# Patient Record
Sex: Male | Born: 1965
Health system: Southern US, Community
[De-identification: ages and names within clinical notes are randomized; demographics above are authoritative.]

## PROBLEM LIST (undated history)

## (undated) DIAGNOSIS — E119 Type 2 diabetes mellitus without complications: Secondary | ICD-10-CM

## (undated) DIAGNOSIS — G4730 Sleep apnea, unspecified: Secondary | ICD-10-CM

## (undated) DIAGNOSIS — D631 Anemia in chronic kidney disease: Secondary | ICD-10-CM

## (undated) DIAGNOSIS — J45909 Unspecified asthma, uncomplicated: Secondary | ICD-10-CM

## (undated) DIAGNOSIS — G4700 Insomnia, unspecified: Secondary | ICD-10-CM

## (undated) DIAGNOSIS — K219 Gastro-esophageal reflux disease without esophagitis: Secondary | ICD-10-CM

## (undated) DIAGNOSIS — N259 Disorder resulting from impaired renal tubular function, unspecified: Secondary | ICD-10-CM

## (undated) DIAGNOSIS — I10 Essential (primary) hypertension: Secondary | ICD-10-CM

## (undated) DIAGNOSIS — I509 Heart failure, unspecified: Secondary | ICD-10-CM

## (undated) DIAGNOSIS — F329 Major depressive disorder, single episode, unspecified: Secondary | ICD-10-CM

## (undated) DIAGNOSIS — I428 Other cardiomyopathies: Secondary | ICD-10-CM

## (undated) DIAGNOSIS — F411 Generalized anxiety disorder: Secondary | ICD-10-CM

## (undated) DIAGNOSIS — N183 Chronic kidney disease, stage 3 unspecified: Secondary | ICD-10-CM

## (undated) DIAGNOSIS — J309 Allergic rhinitis, unspecified: Secondary | ICD-10-CM

## (undated) DIAGNOSIS — M779 Enthesopathy, unspecified: Secondary | ICD-10-CM

## (undated) DIAGNOSIS — F3289 Other specified depressive episodes: Secondary | ICD-10-CM

## (undated) DIAGNOSIS — I429 Cardiomyopathy, unspecified: Secondary | ICD-10-CM

## (undated) HISTORY — DX: Sleep apnea, unspecified: G47.30

## (undated) HISTORY — DX: Essential (primary) hypertension: I10

## (undated) HISTORY — DX: Other cardiomyopathies: I42.8

## (undated) HISTORY — PX: PATELLAR TENDON REPAIR: SHX737

## (undated) HISTORY — DX: Insomnia, unspecified: G47.00

## (undated) HISTORY — DX: Cardiomyopathy, unspecified: I42.9

## (undated) HISTORY — DX: Generalized anxiety disorder: F41.1

## (undated) HISTORY — PX: APPENDECTOMY: SHX54

## (undated) HISTORY — DX: Allergic rhinitis, unspecified: J30.9

## (undated) HISTORY — DX: Disorder resulting from impaired renal tubular function, unspecified: N25.9

## (undated) HISTORY — DX: Other specified depressive episodes: F32.89

## (undated) HISTORY — DX: Major depressive disorder, single episode, unspecified: F32.9

## (undated) HISTORY — DX: Morbid (severe) obesity due to excess calories: E66.01

---

## 2002-05-22 ENCOUNTER — Ambulatory Visit (HOSPITAL_COMMUNITY): Admission: RE | Admit: 2002-05-22 | Discharge: 2002-05-22 | Payer: Self-pay | Admitting: Internal Medicine

## 2003-01-23 ENCOUNTER — Ambulatory Visit (HOSPITAL_COMMUNITY): Admission: RE | Admit: 2003-01-23 | Discharge: 2003-01-23 | Payer: Self-pay | Admitting: Internal Medicine

## 2003-01-29 ENCOUNTER — Ambulatory Visit (HOSPITAL_COMMUNITY): Admission: RE | Admit: 2003-01-29 | Discharge: 2003-01-29 | Payer: Self-pay | Admitting: Internal Medicine

## 2003-01-29 ENCOUNTER — Encounter: Payer: Self-pay | Admitting: Internal Medicine

## 2004-04-23 ENCOUNTER — Encounter: Admission: RE | Admit: 2004-04-23 | Discharge: 2004-04-23 | Payer: Self-pay | Admitting: General Surgery

## 2005-07-23 ENCOUNTER — Inpatient Hospital Stay (HOSPITAL_COMMUNITY): Admission: EM | Admit: 2005-07-23 | Discharge: 2005-07-26 | Payer: Self-pay | Admitting: *Deleted

## 2005-07-25 ENCOUNTER — Encounter (INDEPENDENT_AMBULATORY_CARE_PROVIDER_SITE_OTHER): Payer: Self-pay | Admitting: Interventional Cardiology

## 2005-07-25 ENCOUNTER — Encounter: Payer: Self-pay | Admitting: Vascular Surgery

## 2005-07-29 ENCOUNTER — Ambulatory Visit: Payer: Self-pay | Admitting: Nurse Practitioner

## 2005-07-29 ENCOUNTER — Ambulatory Visit: Payer: Self-pay | Admitting: *Deleted

## 2005-08-26 ENCOUNTER — Ambulatory Visit: Payer: Self-pay | Admitting: Nurse Practitioner

## 2005-12-12 ENCOUNTER — Ambulatory Visit: Payer: Self-pay | Admitting: Nurse Practitioner

## 2005-12-16 ENCOUNTER — Ambulatory Visit (HOSPITAL_COMMUNITY): Admission: RE | Admit: 2005-12-16 | Discharge: 2005-12-16 | Payer: Self-pay | Admitting: Internal Medicine

## 2006-01-02 ENCOUNTER — Ambulatory Visit: Payer: Self-pay | Admitting: Nurse Practitioner

## 2006-01-31 ENCOUNTER — Ambulatory Visit: Payer: Self-pay | Admitting: Nurse Practitioner

## 2006-02-27 ENCOUNTER — Ambulatory Visit: Payer: Self-pay | Admitting: Nurse Practitioner

## 2006-03-28 ENCOUNTER — Ambulatory Visit: Payer: Self-pay | Admitting: Nurse Practitioner

## 2006-05-17 ENCOUNTER — Ambulatory Visit: Payer: Self-pay | Admitting: Nurse Practitioner

## 2006-05-31 ENCOUNTER — Ambulatory Visit: Payer: Self-pay | Admitting: Nurse Practitioner

## 2006-06-08 ENCOUNTER — Ambulatory Visit: Payer: Self-pay | Admitting: Internal Medicine

## 2006-09-04 ENCOUNTER — Ambulatory Visit: Payer: Self-pay | Admitting: Internal Medicine

## 2006-09-18 ENCOUNTER — Ambulatory Visit: Payer: Self-pay | Admitting: Nurse Practitioner

## 2006-11-21 ENCOUNTER — Ambulatory Visit: Payer: Self-pay | Admitting: Internal Medicine

## 2006-11-28 ENCOUNTER — Ambulatory Visit (HOSPITAL_COMMUNITY): Admission: RE | Admit: 2006-11-28 | Discharge: 2006-11-28 | Payer: Self-pay | Admitting: Nurse Practitioner

## 2006-12-07 ENCOUNTER — Ambulatory Visit: Payer: Self-pay | Admitting: Family Medicine

## 2006-12-08 ENCOUNTER — Encounter (INDEPENDENT_AMBULATORY_CARE_PROVIDER_SITE_OTHER): Payer: Self-pay | Admitting: Nurse Practitioner

## 2006-12-08 LAB — CONVERTED CEMR LAB
Albumin: 4 g/dL (ref 3.5–5.2)
BUN: 16 mg/dL (ref 6–23)
Basophils Absolute: 0.1 10*3/uL (ref 0.0–0.1)
Basophils Relative: 1 % (ref 0–1)
CO2: 26 meq/L (ref 19–32)
Calcium, Total (PTH): 9.6 mg/dL (ref 8.4–10.5)
Calcium: 9.6 mg/dL (ref 8.4–10.5)
Chloride: 105 meq/L (ref 96–112)
Creatinine, Ser: 2.08 mg/dL — ABNORMAL HIGH (ref 0.40–1.50)
Eosinophils Absolute: 0.4 10*3/uL (ref 0.0–0.7)
Eosinophils Relative: 4 % (ref 0–5)
Glucose, Bld: 79 mg/dL (ref 70–99)
HCT: 40 % (ref 39.0–52.0)
Hemoglobin: 12.5 g/dL — ABNORMAL LOW (ref 13.0–17.0)
Lymphocytes Relative: 23 % (ref 12–46)
Lymphs Abs: 2.4 10*3/uL (ref 0.7–3.3)
MCHC: 31.3 g/dL (ref 30.0–36.0)
MCV: 70.4 fL — ABNORMAL LOW (ref 78.0–100.0)
Monocytes Absolute: 1 10*3/uL — ABNORMAL HIGH (ref 0.2–0.7)
Monocytes Relative: 10 % (ref 3–11)
Neutro Abs: 6.3 10*3/uL (ref 1.7–7.7)
Neutrophils Relative %: 62 % (ref 43–77)
PTH: 96.8 pg/mL — ABNORMAL HIGH (ref 14.0–72.0)
Phosphorus: 3.4 mg/dL (ref 2.3–4.6)
Platelets: 330 10*3/uL (ref 150–400)
Potassium: 4.1 meq/L (ref 3.5–5.3)
RBC: 5.68 M/uL (ref 4.22–5.81)
RDW: 17.3 % — ABNORMAL HIGH (ref 11.5–14.0)
Sodium: 143 meq/L (ref 135–145)
WBC: 10.1 10*3/uL (ref 4.0–10.5)

## 2006-12-09 ENCOUNTER — Ambulatory Visit: Payer: Self-pay | Admitting: Hospitalist

## 2006-12-09 ENCOUNTER — Inpatient Hospital Stay (HOSPITAL_COMMUNITY): Admission: EM | Admit: 2006-12-09 | Discharge: 2006-12-12 | Payer: Self-pay | Admitting: Emergency Medicine

## 2006-12-11 ENCOUNTER — Ambulatory Visit: Payer: Self-pay | Admitting: *Deleted

## 2006-12-15 ENCOUNTER — Encounter (INDEPENDENT_AMBULATORY_CARE_PROVIDER_SITE_OTHER): Payer: Self-pay | Admitting: Nurse Practitioner

## 2006-12-15 ENCOUNTER — Ambulatory Visit: Payer: Self-pay | Admitting: Internal Medicine

## 2006-12-15 LAB — CONVERTED CEMR LAB
Bilirubin Urine: NEGATIVE
Hemoglobin, Urine: NEGATIVE
Ketones, ur: NEGATIVE mg/dL
Leukocytes, UA: NEGATIVE
Nitrite: NEGATIVE
Protein, ur: NEGATIVE mg/dL
Specific Gravity, Urine: 1.009 (ref 1.005–1.03)
Urine Glucose: NEGATIVE mg/dL
Urobilinogen, UA: 0.2 (ref 0.0–1.0)
pH: 7 (ref 5.0–8.0)

## 2006-12-28 ENCOUNTER — Ambulatory Visit (HOSPITAL_COMMUNITY): Admission: RE | Admit: 2006-12-28 | Discharge: 2006-12-28 | Payer: Self-pay | Admitting: Internal Medicine

## 2007-01-24 ENCOUNTER — Encounter (INDEPENDENT_AMBULATORY_CARE_PROVIDER_SITE_OTHER): Payer: Self-pay | Admitting: *Deleted

## 2007-03-06 ENCOUNTER — Encounter (HOSPITAL_COMMUNITY): Admission: RE | Admit: 2007-03-06 | Discharge: 2007-05-08 | Payer: Self-pay | Admitting: Interventional Cardiology

## 2007-04-04 ENCOUNTER — Ambulatory Visit: Payer: Self-pay | Admitting: Family Medicine

## 2007-04-12 ENCOUNTER — Ambulatory Visit: Payer: Self-pay | Admitting: Internal Medicine

## 2007-04-12 LAB — CONVERTED CEMR LAB
Albumin: 4.5 g/dL (ref 3.5–5.2)
BUN: 20 mg/dL (ref 6–23)
Basophils Absolute: 0 10*3/uL (ref 0.0–0.1)
Basophils Relative: 0 % (ref 0–1)
Bilirubin Urine: NEGATIVE
CO2: 21 meq/L (ref 19–32)
Calcium, Total (PTH): 9.5 mg/dL (ref 8.4–10.5)
Calcium: 9.5 mg/dL (ref 8.4–10.5)
Chloride: 104 meq/L (ref 96–112)
Creatinine, Ser: 1.66 mg/dL — ABNORMAL HIGH (ref 0.40–1.50)
Eosinophils Absolute: 0.3 10*3/uL (ref 0.2–0.7)
Eosinophils Relative: 3 % (ref 0–5)
Ferritin: 261 ng/mL (ref 22–322)
Glucose, Bld: 136 mg/dL — ABNORMAL HIGH (ref 70–99)
HCT: 40.5 % (ref 39.0–52.0)
Hemoglobin, Urine: NEGATIVE
Hemoglobin: 12.5 g/dL — ABNORMAL LOW (ref 13.0–17.0)
Iron: 93 ug/dL (ref 42–165)
Ketones, ur: NEGATIVE mg/dL
Leukocytes, UA: NEGATIVE
Lymphocytes Relative: 29 % (ref 12–46)
Lymphs Abs: 3.4 10*3/uL (ref 0.7–4.0)
MCHC: 30.9 g/dL (ref 30.0–36.0)
MCV: 71.1 fL — ABNORMAL LOW (ref 78.0–100.0)
Monocytes Absolute: 1 10*3/uL (ref 0.1–1.0)
Monocytes Relative: 9 % (ref 3–12)
Neutro Abs: 6.8 10*3/uL (ref 1.7–7.7)
Neutrophils Relative %: 59 % (ref 43–77)
Nitrite: NEGATIVE
PTH: 79.7 pg/mL — ABNORMAL HIGH (ref 14.0–72.0)
Phosphorus: 2.8 mg/dL (ref 2.3–4.6)
Platelets: 422 10*3/uL — ABNORMAL HIGH (ref 150–400)
Potassium: 4.1 meq/L (ref 3.5–5.3)
Protein, ur: 100 mg/dL — AB
RBC / HPF: NONE SEEN (ref <3)
RBC: 5.7 M/uL (ref 4.22–5.81)
RDW: 16.4 % — ABNORMAL HIGH (ref 11.5–15.5)
Saturation Ratios: 26 % (ref 20–55)
Sodium: 142 meq/L (ref 135–145)
Specific Gravity, Urine: 1.014 (ref 1.005–1.03)
TIBC: 357 ug/dL (ref 215–435)
UIBC: 264 ug/dL
Urine Glucose: NEGATIVE mg/dL
Urobilinogen, UA: 0.2 (ref 0.0–1.0)
WBC, UA: NONE SEEN cells/hpf (ref <3)
WBC: 11.6 10*3/uL — ABNORMAL HIGH (ref 4.0–10.5)
pH: 6 (ref 5.0–8.0)

## 2007-05-10 ENCOUNTER — Encounter (HOSPITAL_COMMUNITY): Admission: RE | Admit: 2007-05-10 | Discharge: 2007-06-08 | Payer: Self-pay | Admitting: Interventional Cardiology

## 2007-05-22 ENCOUNTER — Emergency Department (HOSPITAL_COMMUNITY): Admission: EM | Admit: 2007-05-22 | Discharge: 2007-05-23 | Payer: Self-pay | Admitting: Emergency Medicine

## 2007-06-07 ENCOUNTER — Ambulatory Visit: Payer: Self-pay | Admitting: Family Medicine

## 2008-06-06 DIAGNOSIS — F3289 Other specified depressive episodes: Secondary | ICD-10-CM | POA: Insufficient documentation

## 2008-06-06 DIAGNOSIS — G4733 Obstructive sleep apnea (adult) (pediatric): Secondary | ICD-10-CM | POA: Insufficient documentation

## 2008-06-06 DIAGNOSIS — G4700 Insomnia, unspecified: Secondary | ICD-10-CM | POA: Insufficient documentation

## 2008-06-06 DIAGNOSIS — F411 Generalized anxiety disorder: Secondary | ICD-10-CM | POA: Insufficient documentation

## 2008-06-06 DIAGNOSIS — F329 Major depressive disorder, single episode, unspecified: Secondary | ICD-10-CM

## 2008-06-06 DIAGNOSIS — I10 Essential (primary) hypertension: Secondary | ICD-10-CM

## 2008-06-06 HISTORY — DX: Insomnia, unspecified: G47.00

## 2008-06-06 HISTORY — DX: Obstructive sleep apnea (adult) (pediatric): G47.33

## 2008-06-06 HISTORY — DX: Essential (primary) hypertension: I10

## 2008-06-29 IMAGING — CT CT ABDOMEN W/O CM
3 of 4 series · 16 of 46 positions shown, 20 images · non-contrast
Comparison: CT chest abdomen pelvis, 12/16/2005

CLINICAL DATA: Abdominal pain x1 month.

Exam: CT abdomen without contrast
TECHNIQUE: Axial CT images of the abdomen were obtained without IV contrast.

[Series 2: abd/pelv w/o 5.0 b31f st · axial · non-contrast · 0.98mm/px · z∈[-268,+28]mm · 12 of 68 slices shown, 16 images]
[im 6/68  soft-tissue]
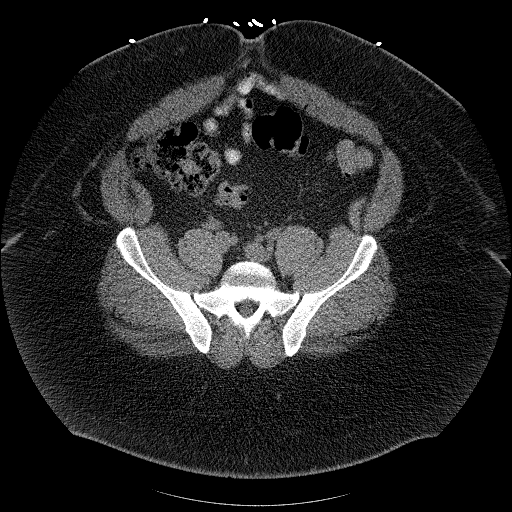
[im 6/68  bone]
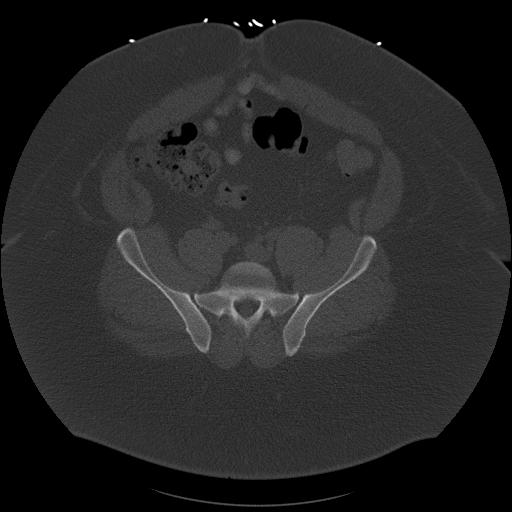
[im 12/68  soft-tissue]
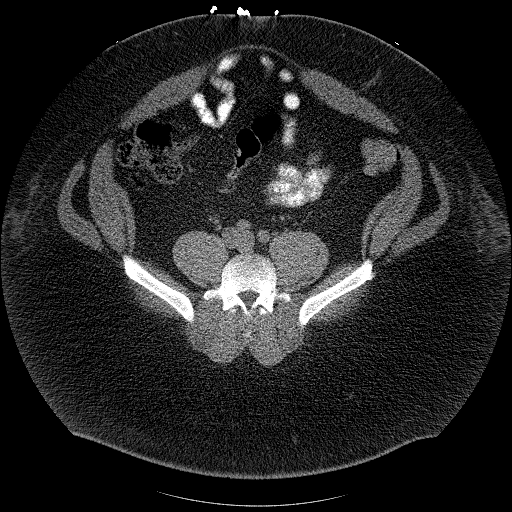
[im 18/68  soft-tissue]
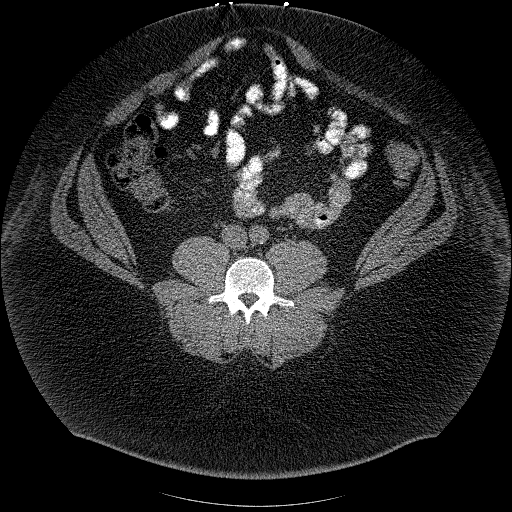
[im 24/68  soft-tissue]
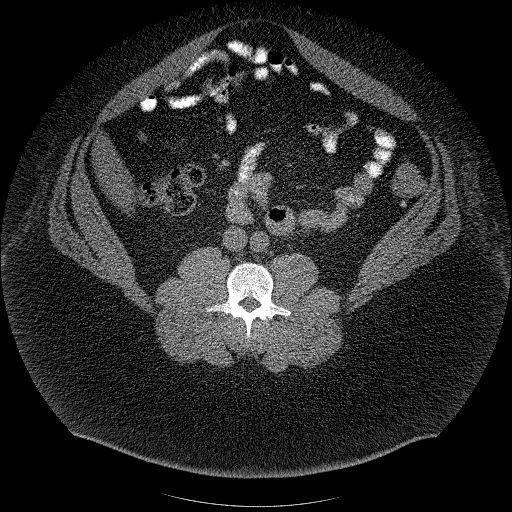
[im 30/68  soft-tissue]
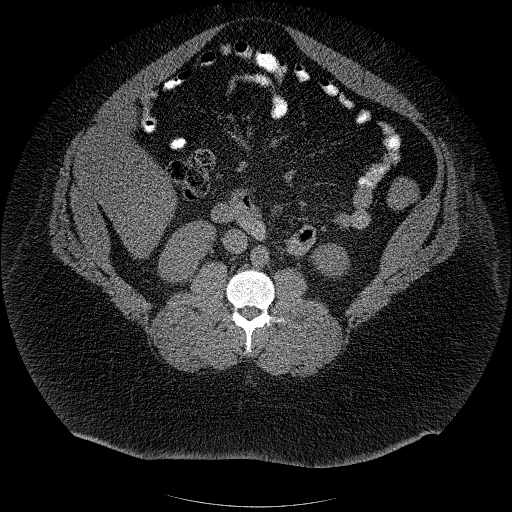
[im 38/68  soft-tissue]
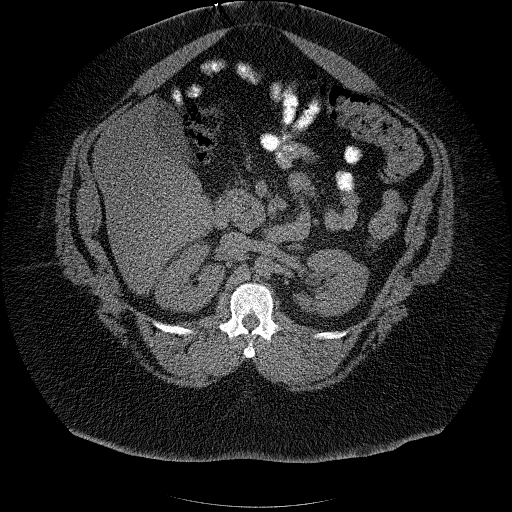
[im 44/68  soft-tissue]
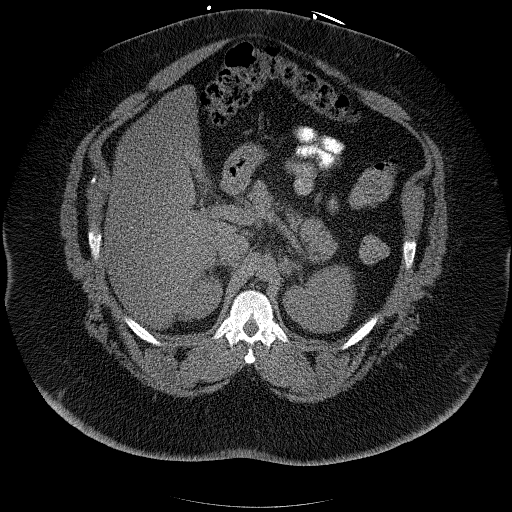
[im 50/68  soft-tissue]
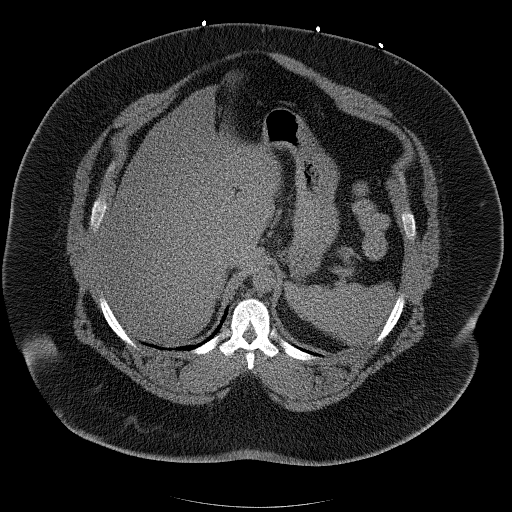
[im 56/68  soft-tissue]
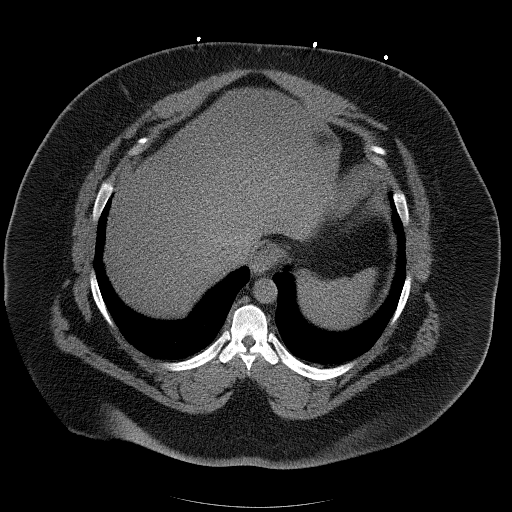
[im 56/68  lung]
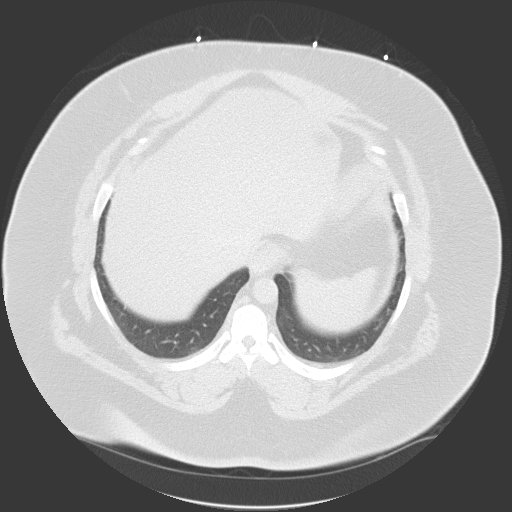
[im 56/68  bone]
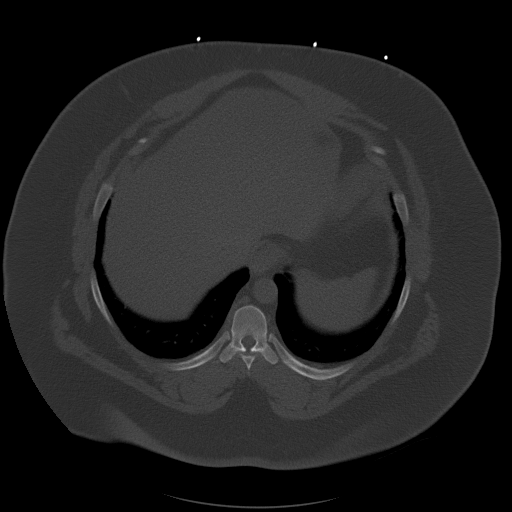
[im 59/68  lung]
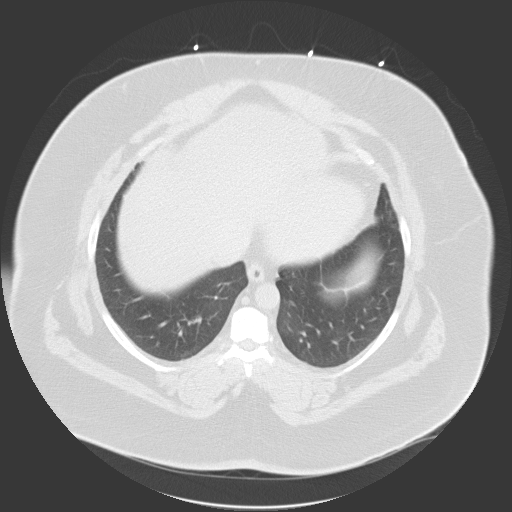
[im 62/68  soft-tissue]
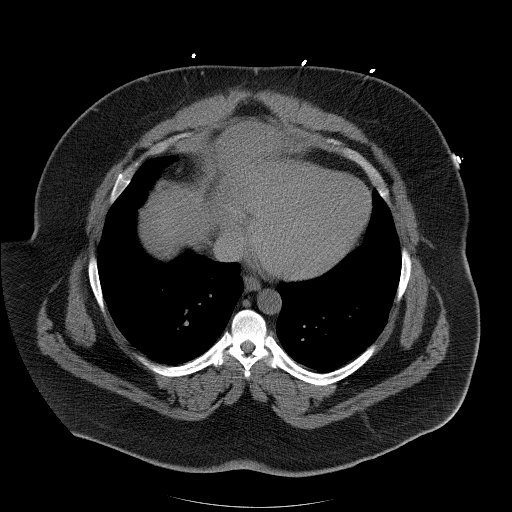
[im 62/68  lung]
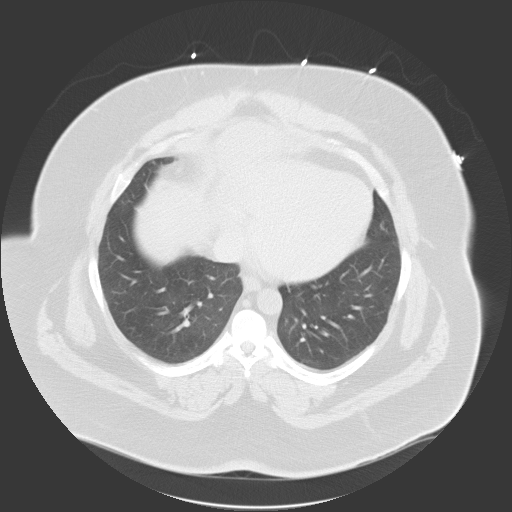
[im 65/68  lung]
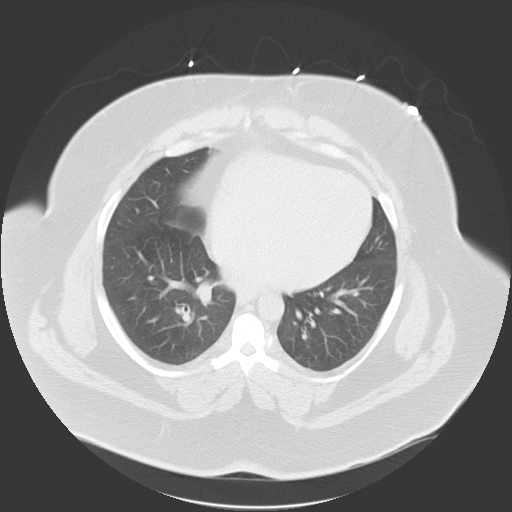

[Series 5: abd/pelv w/o 2.0 spo cor st · coronal · non-contrast · 0.89mm/px · 3 of 191 slices shown]
[im 64/191  soft-tissue]
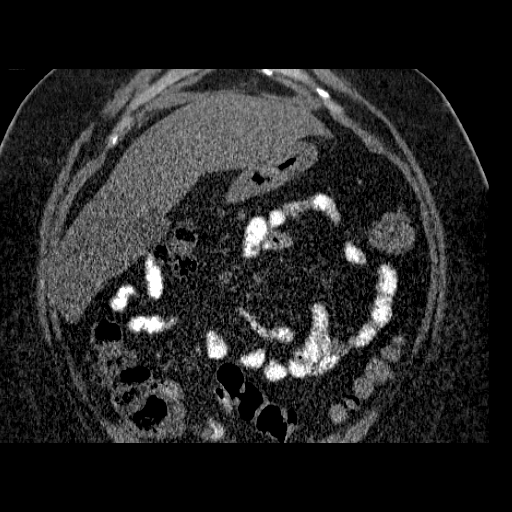
[im 85/191  soft-tissue]
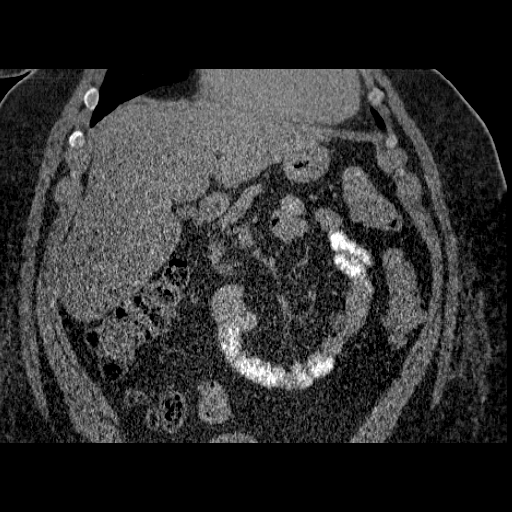
[im 106/191  soft-tissue]
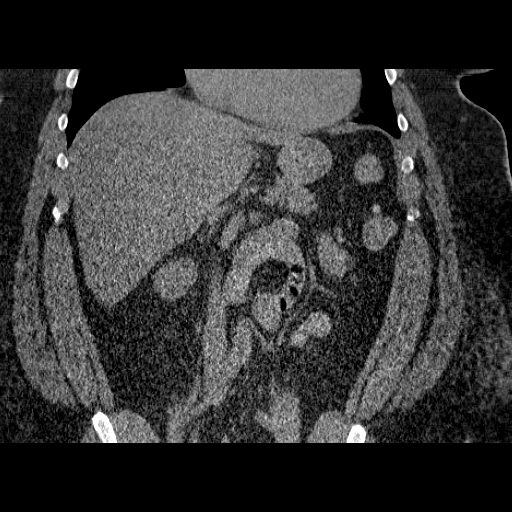

[Series 6: abd/pelv w/o 2.0 spo sag st · sagittal · non-contrast · 0.88mm/px · 1 of 194 slices shown]
[im 65/194  soft-tissue]
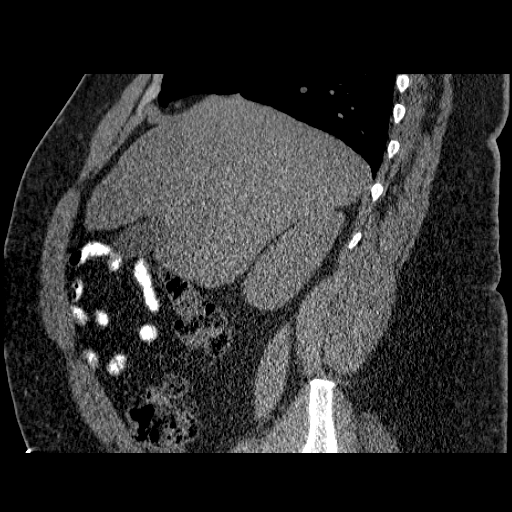

[16 of 46 positions shown; findings below may reference images not displayed]

FINDINGS: Lung bases are clear. Heart appears normal. Examination of the abdomen
is limited lack of IV contrast however, the liver, gallbladder, pancreas,
spleen, adrenal glands and kidneys appear normal. No evidence of free fluid or
free air. Bowel appears normal and course and caliber. There is no evidence of
herniation through the [place]is Gu[place] in the midline ventral abdomen.
IMPRESSION: 1. No evidence of acute abdominal process.

## 2008-07-11 ENCOUNTER — Ambulatory Visit: Payer: Self-pay | Admitting: Internal Medicine

## 2008-07-11 DIAGNOSIS — N259 Disorder resulting from impaired renal tubular function, unspecified: Secondary | ICD-10-CM | POA: Insufficient documentation

## 2008-07-11 DIAGNOSIS — I420 Dilated cardiomyopathy: Secondary | ICD-10-CM | POA: Insufficient documentation

## 2008-07-11 DIAGNOSIS — I429 Cardiomyopathy, unspecified: Secondary | ICD-10-CM | POA: Insufficient documentation

## 2008-07-11 HISTORY — DX: Dilated cardiomyopathy: I42.0

## 2008-07-11 HISTORY — DX: Cardiomyopathy, unspecified: I42.9

## 2008-07-27 DIAGNOSIS — J302 Other seasonal allergic rhinitis: Secondary | ICD-10-CM

## 2008-07-27 DIAGNOSIS — I509 Heart failure, unspecified: Secondary | ICD-10-CM | POA: Insufficient documentation

## 2008-07-27 HISTORY — DX: Other seasonal allergic rhinitis: J30.2

## 2008-07-27 HISTORY — DX: Heart failure, unspecified: I50.9

## 2008-07-28 ENCOUNTER — Encounter: Payer: Self-pay | Admitting: Internal Medicine

## 2008-07-30 ENCOUNTER — Encounter: Payer: Self-pay | Admitting: Internal Medicine

## 2008-08-18 ENCOUNTER — Ambulatory Visit: Payer: Self-pay | Admitting: Internal Medicine

## 2008-10-21 ENCOUNTER — Ambulatory Visit: Payer: Self-pay | Admitting: Internal Medicine

## 2008-12-23 ENCOUNTER — Ambulatory Visit: Payer: Self-pay | Admitting: Diagnostic Radiology

## 2008-12-23 ENCOUNTER — Emergency Department (HOSPITAL_BASED_OUTPATIENT_CLINIC_OR_DEPARTMENT_OTHER): Admission: EM | Admit: 2008-12-23 | Discharge: 2008-12-23 | Payer: Self-pay | Admitting: Emergency Medicine

## 2009-04-09 ENCOUNTER — Encounter: Payer: Self-pay | Admitting: Internal Medicine

## 2009-07-09 ENCOUNTER — Encounter: Payer: Self-pay | Admitting: Internal Medicine

## 2009-09-20 ENCOUNTER — Ambulatory Visit: Payer: Self-pay | Admitting: Diagnostic Radiology

## 2009-09-20 ENCOUNTER — Emergency Department (HOSPITAL_BASED_OUTPATIENT_CLINIC_OR_DEPARTMENT_OTHER): Admission: EM | Admit: 2009-09-20 | Discharge: 2009-09-20 | Payer: Self-pay | Admitting: Emergency Medicine

## 2009-09-21 ENCOUNTER — Telehealth: Payer: Self-pay | Admitting: Internal Medicine

## 2009-09-21 ENCOUNTER — Encounter (INDEPENDENT_AMBULATORY_CARE_PROVIDER_SITE_OTHER): Payer: Self-pay | Admitting: *Deleted

## 2009-10-06 ENCOUNTER — Encounter (INDEPENDENT_AMBULATORY_CARE_PROVIDER_SITE_OTHER): Payer: Self-pay | Admitting: *Deleted

## 2009-10-22 ENCOUNTER — Telehealth: Payer: Self-pay | Admitting: Internal Medicine

## 2009-10-23 ENCOUNTER — Ambulatory Visit: Payer: Self-pay | Admitting: Internal Medicine

## 2009-10-23 DIAGNOSIS — E119 Type 2 diabetes mellitus without complications: Secondary | ICD-10-CM

## 2009-10-23 HISTORY — DX: Type 2 diabetes mellitus without complications: E11.9

## 2010-03-28 ENCOUNTER — Encounter: Payer: Self-pay | Admitting: Internal Medicine

## 2010-04-23 ENCOUNTER — Emergency Department (HOSPITAL_BASED_OUTPATIENT_CLINIC_OR_DEPARTMENT_OTHER)
Admission: EM | Admit: 2010-04-23 | Discharge: 2010-04-23 | Payer: Self-pay | Source: Home / Self Care | Admitting: Emergency Medicine

## 2010-06-10 NOTE — Progress Notes (Signed)
Summary: nos appt  Phone Note Call from Patient   Caller: [email or web address]  Call For: young Summary of Call: Rsc nos from 6/15 to 6/17 @ 3:30p. Initial call taken by: Darletta Moll,  October 22, 2009 10:03 AM

## 2010-06-10 NOTE — Letter (Signed)
Summary: New Patient letter  [facility]  [address] [place], [place] [address]   Phone: [phone]  Fax: [phone]       09/21/2009 MRN: [id number]  Dustin Mcclain [address] [place], [place]  [address]  Dear Dustin Mcclain,  Welcome to the Gastroenterology Division at [facility].    You are scheduled to see Dr.  Marina Goodell on 11/11/2009 at 9:30AM on the 3rd floor at [facility], [address].  We ask that you try to arrive at our office 15 minutes prior to your appointment time to allow for check-in.  We would like you to complete the enclosed self-administered evaluation form prior to your visit and bring it with you on the day of your appointment.  We will review it with you.  Also, please bring a complete list of all your medications or, if you prefer, bring the medication bottles and we will list them.  Please bring your insurance card so that we may make a copy of it.  If your insurance requires a referral to see a specialist, please bring your referral form from your primary care physician.  Co-payments are due at the time of your visit and may be paid by cash, check or credit card.     Your office visit will consist of a consult with your physician (includes a physical exam), any laboratory testing he/she may order, scheduling of any necessary diagnostic testing (e.g. x-ray, ultrasound, CT-scan), and scheduling of a procedure (e.g. Endoscopy, Colonoscopy) if required.  Please allow enough time on your schedule to allow for any/all of these possibilities.    If you cannot keep your appointment, please call [phone] to cancel or reschedule prior to your appointment date.  This allows [place] the opportunity to schedule an appointment for another patient in need of care.  If you do not cancel or reschedule by 5 p.m. the business day prior to your appointment date, you will be charged a $50.00 late cancellation/no-show fee.    Thank you for choosing  [facility] for your medical needs.  We appreciate the opportunity to care for you.  Please visit [place] at our website  to learn more about our practice.                     Sincerely,                                                             The Gastroenterology Division

## 2010-06-10 NOTE — Assessment & Plan Note (Signed)
Summary: Dustin Mcclain   Copy to:  Yates Decamp Primary Provider/Referring Provider:  Osei-Bonsu  CC:  rov and .  History of Present Illness: 07/11/08- History of Present Illness: New sleep medicine consult courtesy of Dr Yates Decamp for this 52 yoM concerned about sleep disordered breathing. he has past hx od loud snoring and witnessed apnea. A sleep study at [facility] in 2003 demonstrated obstructive sleep apnea. That report is not immeditely available. He had been using cpap every night at uncertain pressure. Not sure about DME company. Mask has now deteriorated and he has not been able to use it for the past year. He says he misses it- felt better with regular use but now very sleepy in day and snoring loudly again. Bedtime 10-11PM, wakes 4-5 times per night-often for nocturia, sleep latency 8 minutes, finally up around 6-7AM. He has lost weight 420-> 385 lbs.  No ENT surgery or hx of thyroid disease  08/17/08- OSA, Allergic rhinitis, cardiomyopathy/ CHF Persistent cough productive of clear fluid "not from my lungs" Very sensitive to odors. Says he outgrew asthma. Trying to walk for weight loss. Not much sneeze or head congeston. Neti pot used once, did help. had epistaxis aftr hard nose blowing. CPAP was titrated to 14- he says too much. He stopped using it while he had a cold.  10/21/08- , Allergic rhinitis, cardiomyopathy/CHF.....................................son with him today Loves the cpap at 14. Feels much  better during day. Feels better rested and notes blood pressure control is much better as well. denies discomforts or concerns with cpap use. Denies cough  October 23, 2009- OSA, Morbid obesity, Allergic rhinitis, cardiomyopathy/ CHF............son with him He is very pleased with CPAP, using it all night every night and for naps. Pressure 14. Mask comfortable.  Allergic rhinitis has been better this year- note filter on CPAP likely to help. Has trouble tolerating summer heat.  He is told his  cardiomyopthy is improved- [facility] Weight today is 392 lbs, still obviously too much although he says he has dropped some.     Preventive Screening-Counseling & Management  Alcohol-Tobacco     Smoking Status: never  Current Medications (verified): 1)  Aldactone 50 Mg Tabs (Spironolactone) .... Take One (1) Tablet By Mouth Every Day 2)  Furosemide 80 Mg Tabs (Furosemide) .... Take One (1) Tablet By Mouth Two (2) Times Daily 3)  Glucotrol 10 Mg Tabs (Glipizide) .... Take One (1) Tablet By Mouth Two (2) Times Daily 4)  Potassium Chloride Crys Cr 20 Meq Cr-Tabs (Potassium Chloride Crys Cr) .... Take One (1) Tablet By Mouth Two (2) Times Daily 5)  Nexium 40 Mg Cpdr (Esomeprazole Magnesium) .... Take 1 By Mouth Once Daily 6)  Cpap  14 Advanced 7)  Lisinopril 20 Mg Tabs (Lisinopril) .... Take 1 By Mouth Once Daily 8)  Paroxetine Hcl 10 Mg/35ml Susp (Paroxetine Hcl) .... 2 Tables Once Daily 9)  Carvedilol 25 Mg Tabs (Carvedilol) .Marland Kitchen.. 1 Tablet Once Daily 10)  Norvasc 10 Mg Tabs (Amlodipine Besylate) .Marland Kitchen.. 1 Tablet Once Daily  Allergies (verified): No Known Drug Allergies  Past History:  Past Medical History: Last updated: 07/11/2008 OBESITY, MORBID (ICD-278.01) ALLERGIC RHINITIS (ICD-477.9) SLEEP APNEA (ICD-780.57) RENAL INSUFFICIENCY (ICD-588.9) CARDIOMYOPATHY (ICD-425.4) HYPERTENSION (ICD-401.9) INSOMNIA (ICD-780.52) ANXIETY (ICD-300.00) DEPRESSION (ICD-311)  Past Surgical History: Last updated: 07/11/2008 Appendectomy Repair torn left patellar tendon  Family History: Last updated: 10/23/2009 Heart disease Parents living  Social History: Last updated: 07/11/2008 Patient never smoked.  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  The patient denies shortness of breath at rest, productive cough, non-productive cough, coughing up blood, chest pain, irregular heartbeats, acid heartburn, indigestion, loss of appetite, weight change, abdominal pain, difficulty swallowing, sore throat, tooth/dental problems, headaches, nasal congestion/difficulty breathing through nose, and sneezing.    Vital Signs:  Patient profile:   45 year old male Height:      71 inches Weight:      392 pounds BMI:     54.87 O2 Sat:      97 % on Room air Pulse rate:   77 / minute BP sitting:   152 / 90  (left arm) Cuff size:   large  Vitals Entered By: Reynaldo Minium CMA (October 23, 2009 3:24 PM)  O2 Sat at Rest %:  97% O2 Flow:  Room air  Physical Exam  Additional Exam:  General: A/Ox3; pleasant and cooperative, NAD, morbidly obese, alert SKIN: no rash, lesions NODES: no lymphadenopathy HEENT: Laurie/AT, EOM- WNL, Conjuctivae- clear, PERRLA, TM-WNL, Nose- clear, Throat- Mallampati III and tonsils present, no stridor NECK: Supple w/ fair ROM, JVD- none, normal carotid impulses w/o bruits Thyroid- normal to palpation CHEST: Clear to P&A, HEART: RRR, no m/g/r heard ABDOMEN:  NWG:NFAO, nl pulses, no edema  NEURO: Grossly intact to observation      Impression & Recommendations:  Problem # 1:  SLEEP APNEA (ICD-780.57)  Good compliance and control. No changes are indicated for now, except weight loss would help.  Problem # 2:  CHF (ICD-428.0) Modest fluid retention. He is trying to walk 2 miles and going better than he did. We discussed CHF as a contributer to dyspnea especially at night. The following medications were removed from the medication list:    Metoprolol Tartrate 50 Mg Tabs (Metoprolol tartrate) .Marland Kitchen... Take 1 and 1/2 tablets twice daily His updated medication list for this problem includes:    Aldactone 50 Mg Tabs (Spironolactone) .Marland Kitchen... Take one (1) tablet by mouth every day     Furosemide 80 Mg Tabs (Furosemide) .Marland Kitchen... Take one (1) tablet by mouth two (2) times daily    Lisinopril 20 Mg Tabs (Lisinopril) .Marland Kitchen... Take 1 by mouth once daily    Carvedilol 25 Mg Tabs (Carvedilol) .Marland Kitchen... 1 tablet once daily  Problem # 3:  ALLERGIC RHINITIS (ICD-477.9)  Seasonal rhinitis- better this year. We discussed options.  Orders: Est. Patient Level III (13086)  Medications Added to Medication List This Visit: 1)  Paroxetine Hcl 10 Mg/37ml Susp (Paroxetine hcl) .... 2 tables once daily 2)  Carvedilol 25 Mg Tabs (Carvedilol) .Marland Kitchen.. 1 tablet once daily 3)  Norvasc 10 Mg Tabs (Amlodipine besylate) .Marland Kitchen.. 1 tablet once daily  Patient Instructions: 1)  Please schedule a follow-up appointment in 1 year. 2)  Continue CPAP 14. Call if needed 3)  keep working at that weight- You know you will feel  better.   Immunization History:  Influenza Immunization History:    Influenza:  fluvax 3+ (06/11/2009)

## 2010-06-10 NOTE — Letter (Signed)
Summary: CMN for CPAP Supplies/[facility]  CMN for CPAP Supplies/[facility]   Imported By: Sherian Rein 07/10/2009 15:18:50  _____________________________________________________________________  External Attachment:    Type:   Image     Comment:   External Document

## 2010-06-10 NOTE — Letter (Signed)
Summary: Appointment Reminder  [facility]  [address] [place], [place] [address]   Phone: [phone]  Fax: [phone]        Oct 06, 2009 MRN: [id number]    Dustin Mcclain [address] [place], [place]  [address]    Dear Mr. Branch,   We have been unable to reach you by phone.Your appointment has been moved up to Monday- June 27,2011 at 8:30 am. with Doree Fudge.A. per Dr. Lamar Sprinkles request. Please be here at 8:15 a.m. and be sure to bring your insurance card and a list of your medication.  Sincerely,  Teryl Lucy RN

## 2010-06-10 NOTE — Progress Notes (Signed)
Summary: triage  Phone Note Call from Patient Call back at Home Phone [phone]   Caller: Patient Call For: Dr. Marina Goodell Reason for Call: Talk to Nurse Summary of Call: sch'ed pt for next available with Dr. Marina Goodell, but pt would like someone to see him sooner... alarming reason to be seen, see IDX appt notes... ok to see Amy or Gunnar Fusi if needed Initial call taken by: Vallarie Mare,  Sep 21, 2009 12:31 PM  Follow-up for Phone Call         iPt. was seen yesterday at [facility] Cat Scan  and records from there  were revieiwed by Dr.Tommaso Cavitt.Ct was negative and Dr.Edwards suggested it may be muscular.Per Dr.Danyella Mcginty  pt  may be scheduled with PA the next time he is supervising DR.Pt informed that I will contact him as soon as that becomes available in June. Follow-up by: Teryl Lucy RN,  Sep 21, 2009 2:52 PM    0

## 2010-06-10 NOTE — Letter (Signed)
Summary: CMN/[facility]  CMN/[facility]   Imported By: Lester [place] 04/05/2010 09:33:31  _____________________________________________________________________  External Attachment:    Type:   Image     Comment:   External Document

## 2010-07-19 LAB — GLUCOSE, CAPILLARY
Glucose-Capillary: 119 mg/dL — ABNORMAL HIGH (ref 70–99)
Glucose-Capillary: 557 mg/dL (ref 70–99)

## 2010-07-19 LAB — URINALYSIS, ROUTINE W REFLEX MICROSCOPIC
Glucose, UA: >1000 mg/dL — AB
Leukocytes, UA: NEGATIVE
Nitrite: NEGATIVE
Specific Gravity, Urine: 1.031 — ABNORMAL HIGH (ref 1.005–1.030)
pH: 5.5 (ref 5.0–8.0)

## 2010-07-19 LAB — BASIC METABOLIC PANEL
CO2: 21 mEq/L (ref 19–32)
Chloride: 102 mEq/L (ref 96–112)
Creatinine, Ser: 1.5 mg/dL (ref 0.4–1.5)
GFR calc Af Amer: >60 mL/min (ref >60)
Potassium: 3.9 mEq/L (ref 3.5–5.1)
Sodium: 138 mEq/L (ref 135–145)

## 2010-07-19 LAB — CBC
HCT: 40.3 % (ref 39.0–52.0)
Hemoglobin: 13.4 g/dL (ref 13.0–17.0)
MCH: 21.5 pg — ABNORMAL LOW (ref 26.0–34.0)
MCHC: 33.3 g/dL (ref 30.0–36.0)
RDW: 15.3 % (ref 11.5–15.5)

## 2010-07-19 LAB — COMPREHENSIVE METABOLIC PANEL
ALT: 35 U/L (ref 0–53)
CO2: 20 mEq/L (ref 19–32)
Calcium: 10 mg/dL (ref 8.4–10.5)
Creatinine, Ser: 1.7 mg/dL — ABNORMAL HIGH (ref 0.4–1.5)
GFR calc Af Amer: 54 mL/min — ABNORMAL LOW (ref >60)
GFR calc non Af Amer: 44 mL/min — ABNORMAL LOW (ref >60)
Glucose, Bld: 515 mg/dL — ABNORMAL HIGH (ref 70–99)
Sodium: 136 mEq/L (ref 135–145)
Total Protein: 7.8 g/dL (ref 6.0–8.3)

## 2010-07-19 LAB — POCT CARDIAC MARKERS
CKMB, poc: 2.5 ng/mL (ref 1.0–8.0)
Myoglobin, poc: 221 ng/mL (ref 12–200)

## 2010-07-19 LAB — URINE MICROSCOPIC-ADD ON

## 2010-07-26 LAB — COMPREHENSIVE METABOLIC PANEL
ALT: 33 U/L (ref 0–53)
AST: 23 U/L (ref 0–37)
CO2: 27 mEq/L (ref 19–32)
Chloride: 106 mEq/L (ref 96–112)
Creatinine, Ser: 1.6 mg/dL — ABNORMAL HIGH (ref 0.4–1.5)
GFR calc Af Amer: 58 mL/min — ABNORMAL LOW (ref >60)
GFR calc non Af Amer: 48 mL/min — ABNORMAL LOW (ref >60)
Glucose, Bld: 141 mg/dL — ABNORMAL HIGH (ref 70–99)
Total Bilirubin: 0.3 mg/dL (ref 0.3–1.2)

## 2010-07-26 LAB — DIFFERENTIAL
Basophils Absolute: 0.2 10*3/uL — ABNORMAL HIGH (ref 0.0–0.1)
Eosinophils Absolute: 0.5 10*3/uL (ref 0.0–0.7)
Eosinophils Relative: 5 % (ref 0–5)
Lymphocytes Relative: 24 % (ref 12–46)
Neutrophils Relative %: 60 % (ref 43–77)

## 2010-07-26 LAB — LIPASE, BLOOD: Lipase: 134 U/L (ref 23–300)

## 2010-07-26 LAB — CBC
Hemoglobin: 12.2 g/dL — ABNORMAL LOW (ref 13.0–17.0)
MCV: 70.4 fL — ABNORMAL LOW (ref 78.0–100.0)
RBC: 5.5 MIL/uL (ref 4.22–5.81)
WBC: 10.4 10*3/uL (ref 4.0–10.5)

## 2010-07-26 LAB — POCT CARDIAC MARKERS

## 2010-09-21 NOTE — Discharge Summary (Signed)
NAME:  Dustin Mcclain, Dustin Mcclain NO.:  [id number]   MEDICAL RECORD NO.:  [id number]          PATIENT TYPE:  INP   LOCATION:  5002                         FACILITY:  [facility]   PHYSICIAN:  Elby Showers, MD    DATE OF BIRTH:  [birth year]   DATE OF ADMISSION:  12/09/2006  DATE OF DISCHARGE:  12/12/2006                               DISCHARGE SUMMARY   PRIMARY CARE PHYSICIAN:  [facility].  Patient is not sure which  physician is his primary care physician at that facility.   DISCHARGE DIAGNOSES:  Include:  1. Decreasing respiratory capacity, etiology unclear.  2. Weight gain/morbid obesity.  3. Right heart failure.  4. Diabetes.  5. Renal insufficiency.   DISCHARGE MEDICATIONS:  1. Lasix 80 mg 1 tablet twice a day.  2. Aldactone 50 mg 1 tablet once a day.  3. Metoprolol 75 mg 1 tablet twice a day.  4. Protonix 40 mg 1 twice daily.  5. Avapro 300 mg 1 tablet daily.  6. Potassium chloride 20 mEq 1 tablet daily.  7. Glucotrol 20 mg 1 tablet twice a day.  8. The patient has been advised to stop taking metformin upon      discharge.   Upon discharge, the patient is scheduled to followup with [facility] on  Friday, August 8, at noon.  The patient is to follow up with [facility]  cardiovascular ultrasound for a 2D echo on August 14 at 9 a.m.  The  patient will need to be connected to cardiopulmonary rehab through  [facility].  At the time of his appointment with [facility], a BMET  needs to be checked, specifically looking at his creatinine.   PROCEDURES PERFORMED DURING THIS HOSPITALIZATION:  Include:  1. A V-Q scan performed on August 5 to rule out PE, which showed very      low probability for a pulmonary embolus.  2. CT of the abdomen with oral contrast performed on August 2, which      showed no evidence of acute abdominal process.  3. X-ray of the chest performed on August 2, which showed cardiomegaly      with central pulmonary vascular congestion.   CONSULTATIONS:  There were no consultations during this admission.   BRIEF ADMITTING HISTORY AND PHYSICAL:  As follows:  Dustin Mcclain is a 45-  year-old male with a history of CHF secondary to right heart failure,  obstructive sleep apnea, obesity, hypertension presenting with increased  shortness of air and cough productive of clear, foamy phlegm.  The  patient has had 2 to 3 weeks of increasing shortness of breath and  cough.  He cannot walk 2 to 4 feet without becoming winded.  The cough  is constant, exacerbated by movement or talking and produces clear,  foamy discharge.  The patient cannot sleep, must sit up at night, feels  smothered.  The patient has had 8 years of consistent weight gain,  claims that he used to be 250 pounds and very fit.  He is extremely  frustrated by his weight gain.  He says he does not eat sugars or excess  food.  The patient also complains of left abdominal pain, worse in the  left upper quadrant, feels there is a mass there, feels that it is  warmer than the right side.  He has no fevers, no chills, no past  surgical history, no smoking, no syncope, no focal weakness, and no  palpitations.   PHYSICAL EXAM:  VITAL SIGNS:  Temperature was 97.0, blood pressure was  208/129, pulse 105, respirations 36, he was saturating 99% on room air.  GENERAL:  The patient is alert, oriented x3.  Very pleasant.  HEENT:  Extraocular motion intact.  Pupils equal, round, and reactive.  No icterus, no injection. Mucous membranes are moist.  There is no  erythema.  NECK:  Supple.  Cannot evaluate for JVD due to size.  He has wheezes at  the right lung base, otherwise lung are clear to auscultation.  CARDIOVASCULAR EXAM:  Regular rate and rhythm.  No murmurs, rubs, or  gallops.  ABDOMEN:  Bowel sounds are decreased.  Abdomen is taut and distended.  Possible mass palpated in the left upper quadrant.  Definite ventral  hernia.  EXTREMITIES:  1+ edema in bilateral lower  extremities.  Pulses 2+.  SKIN:  Dry and warm.  There are no submandibular or supraclavicular  nodes.  Strength is 5/5 in all extremities.  NEURO EXAM:  Grossly intact.   Sodium 141, potassium 3.6, chloride 105, bicarb 29, BUN 15, creatinine  1.6.  Baseline in March of 2007 is 2.1.  Glucose 109.  White blood cell  count 10.8, hemoglobin 12.4, hematocrit 39.3, platelets 332.  Absolute  neutrophil count was 6.4, MCV 70.4, RDW was 16.7.  Bilirubin 0.7, alk  phos 89, AST 40, ALT 47, protein 7.2, albumin 3.5, calcium 9.2.  BNP  229, D-dimer 1.67.  Point of care marker is slightly positive.  Troponin  0.07, myoglobin 292, CK-MB 8.2.  Chest x-ray shows cardiomegaly, central  pulmonary vascular congestion.  At the time of the patient's shortness  of air and cough, differential diagnosis was broad, including pneumonia,  CHF exacerbation, fluid overload, asthma or chronic bronchitis,  increased pressure from abdominal size, and PE.  Chest x-ray showed no  pulmonary edema.  V/Q scan showed very low probability of PE.  White  blood cell count was within normal limits.  There was no fever.  Sputum  sample produced only normal oral flora.  BNP was 229; in an obese  patient can be falsely decreased.  The patient was diuresed during his  stay and seemed to improve his respiratory status.   PROBLEM LIST:  1. This patient has chronic respiratory dysfunction, was evaluated by      PT/OT and outpatient cardiopulmonary rehab has been recommended.  2. Abdominal pain, left-sided:  CT of the abdomen showed no acute      process.  UA was negative.  Urine culture negative.  The patient      was eating, drinking, and voiding normally during his stay in the      hospital.  There was no etiology identified for his abdominal pain.  3. Abdominal size/morbid obesity:  The patient was kept on low-carb,      heart-healthy diet.  Twenty-four urine cortisol was collected to      rule out Cushing's disease.  The patient  has sort of a  cushingoid      habitus and is chronically hypokalemic.  Also, adamantly denies      overeating.  Results of 24-hour urine cortisol  are pending.  TSH      was checked, within normal limits.  4. Diabetes:  Metformin was discontinued due to his renal      insufficiency.  Hemoglobin A1c was checked during his      hospitalization and was 6.3, which is fairly good control.  The      patient's Glucotrol was increased to 20 mg b.i.d.  5. Hypertension:  The patient's blood pressure was difficult to      control in the hospital, but by the last day, he was under control      on his home regimen of metoprolol, Aldactone, and Lasix.   DISCHARGE VITAL SIGNS:  Temperature 99.0, pulse 83, respirations 20,  blood pressure 115/73, O2 sat 97% on room air.   DISCHARGE LABORATORY:  BMP:  Sodium 140, potassium 4.2, chloride 104,  bicarb 29.  Glucose 88, BUN 17, creatinine 1.6, calcium 9.4.  CBC:  White blood cells 10.7, hemoglobin 12.6, hematocrit 40.0, platelet count  350.   OTHER LABS PERFORMED DURING THIS HOSPITALIZATION:  Include amylase on  admission to investigate cause of abdominal pain, result was 147, which  is very mildly elevated.  Lipase again to evaluate for abdominal pain,  result was 24, which is within normal limits at this hospital.  Lipid  profile:  Total cholesterol 187, triglycerides 139, HDL 28, LDL 131.  Cardiac panel:  Creatine kinase 794, CK-MB 7.4, troponin I 0.13.  Elevated levels were attributed to renal insufficiency.  EKG showed no  changes from previous.   SUMMARY:  This is a 45 year old male with multiple medical issues.  He  will benefit from followup with cardiopulmonary rehab, further  evaluation of his renal insufficiency.  He also will be scheduled for a  2D echo to further evaluate his right heart failure.  This is a very  motivated patient, anticipate good progress with followup.      Elby Showers, MD  Electronically Signed     CW/MEDQ   D:  12/12/2006  T:  12/13/2006  Job:  [id number]

## 2010-09-24 NOTE — Discharge Summary (Signed)
NAME:  Dustin Mcclain, Dustin Mcclain NO.:  [id number]   MEDICAL RECORD NO.:  [id number]          PATIENT TYPE:  INP   LOCATION:  6705                         FACILITY:  [facility]   PHYSICIAN:  Isidor Holts, M.D.  DATE OF BIRTH:  09-Mar-1966   DATE OF ADMISSION:  07/23/2005  DATE OF DISCHARGE:  07/26/2005                                 DISCHARGE SUMMARY   PRIMARY CARE PHYSICIAN:  Unassigned.   DISCHARGE DIAGNOSES:  1.  Congestive cardiomyopathy/Congestive heart failure.  2.  Type 2 diabetes mellitus.  3.  Obstructive sleep apnea syndrome, on nocturnal continuous positive      airway pressure.  4.  Hypoventilation syndrome.  5.  Morbid obesity.   DISCHARGE MEDICATIONS:  1.  Aspirin 81 mg p.o. daily.  2.  Lasix 80 mg p.o. b.i.d.  3.  Aldactone 50 mg p.o. daily  4.  Lopressor 50 mg p.o. b.i.d.  5.  K-Dur 20 mEq p.o. b.i.d.  6.  Norvasc 10 mg p.o. daily.  7.  Glipizide 10 mg p.o. b.i.d.   PROCEDURE:  1.  Portable chest x-ray dated July 23, 2005 showed mild cardiomegaly but      no active cardiopulmonary disease.  2.  Ventilation perfusion lung scan dated July 24, 2005 that was very low      probability for pulmonary embolism.  3.  Renal ultrasound scan dated July 24, 2005 showed unremarkable kidneys,      no evidence of ascites, stable mild circumferential bladder wall      thickening.  4.  Bilateral venous extremity Dopplers dated July 25, 2005 showed no      evidence of DVT, superficial thrombus, or Baker's cyst.  5.  2-D echocardiogram dated July 25, 2005.  Report is still pending at the      time of this dictation.   CONSULTATIONS:  None.   HISTORY OF PRESENT ILLNESS:  As in H&P note of July 23, 2005, dictated by  Dr. Mamie Levers. However, in brief, this is a 45 year old male, with  known history of hypertension, type 2 diabetes mellitus, CHF, obstructive  sleep apnea, and hypoventilation syndrome on nocturnal CPAP, who presents  with increasing  shortness of breath on exertion, as well as increased weight  gain.  He was admitted for further evaluation, investigation, and  management.   HOSPITAL COURSE:  Problem 1.  Congestive cardiomyopathy/CHF.  This is a  patient with known history of congestive heart failure, apparently diagnosed  in January of 2007 at [facility].  He does not have a  primary care physician, and therefore has not been on regular followup.  He  gained approximately 12 pounds in the two days prior to this presentation  but had noted gradually increasing shortness of breath on exertion and  increasing weight gain.  On physical examination, the patient was found to  be fluid overloaded.  Chest x-ray showed no acute cardiopulmonary disease.  Cardiac enzymes were not elevated.  EKG showed no acute ischemic changes.  The patient was managed with a combination of diuretic treatment, beta-  blocker, with dramatic clinical  response.  His weight was down from almost  400 pounds to 383 pounds by July 26, 2005.  He is now on a combination of  Lasix, aldactone, and Lopressor.  We expect his PMD, will monitor his  electrolytes closely and adjust his medications as indicated.  At the time  of presentation, the patient's BUN was 21 with a creatinine of 1.7.  Under  the effect of diuretics BUN is now 19 with a creatinine of 2.1.  Zaroxolyn,  which was started at the time of presentation, has therefore been  discontinued.  Renal function will bear close watching, and diuretic  medications adjusted downward as indicated.  I will leave this to the  patient's primary M.D.   Note, at the time of presentation, the patient did have some chest  discomfort.  Acute coronary syndrome was ruled out; however, the patient had  a ventilation perfusion lung scan to rule out the possibility of DVT.  This  was reported as very low probability.  Also, venous Dopplers of the lower  extremities showed no evidence of DVT.    Problem 2.  Obstructive sleep apnea syndrome/Hypoventilation syndrome.  The  patient is morbidly obese and is on nocturnal CPAP.  He was continued on  this during his hospital stay, and he tolerated this well.  There were no  issues referable to this.   Problem 3.  Type 2 diabetes mellitus.  This was controlled with a  combination of carbohydrate-modified diet and Glipizide.  The patient's  Glipizide has been adjusted upward from 10 mg p.o. daily at the time of  admission, to 10 mg p.o. b.i.d.  TSH was normal at 2.652.  Lipid profile  showed the following findings.  Total cholesterol 166, triglycerides 231,  HDL 29, LDL 91, i.e., excellent lipid profile.   Problem 4.  Hypertension.  This was controlled adequately, with the  combination of Norvasc and diuretics.   DISPOSITION:  The patient is sufficiently recovered and deemed clinically  stable for discharge on July 26, 2005, in satisfactory condition.  It is  pertinent to note that the patient's albumin level was reasonable at 3.6,  and 24-hour urinary protein was 559 mg, effectively ruling out nephrosis.  Renal ultrasound scan was quite unremarkable.  Hemoglobin A1C was 7.2.   DIET:  Healthy heart/Carbohydrate-modified.   ACTIVITY:  As tolerated.   WOUND CARE:  Not applicable.   PAIN MANAGEMENT:  Not applicable.   FOLLOW UP:  The patient has been arranged a followup appointment with the  Medical Department at [facility].  We expect him to be seen within one  week.   SPECIAL INSTRUCTIONS:  We have recommended fluid restriction of 1500 cc per  day.  We also recommend that the patient's subsequent primary care physician  arrange referral to a cardiologist, as this would be beneficial in a young  individual with congestive cardiomyopathy.  Note, at the time of this  dictation, echocardiogram results are not available.      Isidor Holts, M.D.  Electronically Signed    CO/MEDQ  D:  07/26/2005  T:  07/26/2005  Job:   [id number]

## 2010-09-24 NOTE — H&P (Signed)
NAME:  Dustin Mcclain, DWORKIN NO.:  [id number]   MEDICAL RECORD NO.:  [id number]          PATIENT TYPE:  EMS   LOCATION:  MAJO                         FACILITY:  [facility]   PHYSICIAN:  Jonna L. Robb Matar, M.D.DATE OF BIRTH:  [birth year]   DATE OF ADMISSION:  07/23/2005  DATE OF DISCHARGE:                                HISTORY & PHYSICAL   CHIEF COMPLAINT:  Weight gain.   HISTORY:  This 45 year old African-American male has had several year  history of problems with increasing weight gain and fluid, although he was  not actually told congestive heart failure until January of this year when  he was admitted to [facility].  Had a lot of difficulty getting  fluid off and they sent him out.  He really has not had any follow-up since  then.  He left [place] in January at 362 pounds and now he is almost 400  pounds.  Yesterday evening he started to get some tightness in his chest and  a dull squeezing feeling, short of breath, and worsening swelling in his  lower extremities.  He gained 12 pounds in the last two days.   PAST MEDICAL HISTORY:  1.  Congestive heart failure.  I am not sure what studies were done.  2.  Hypertension.  3.  Type 2 diabetes.  He was told he was borderline diabetic.  He had been      put on Glipizide on discharge but he is not checking his sugars and no      one has followed up to see what they are.  4.  Obstructive sleep apnea and hypoventilation syndrome.  He does wear      CPAP, but says he tends to pull it off in the middle of the night.  When      he wakes up he remembers to put it back on.   The only information I have is from the work-up but did get a CAT scan of  the abdomen here in December of 2005 when they noted that he had had marked  cardiomegaly and a fatty infiltration in the liver.   SURGERY:  Left knee surgery.   FAMILY HISTORY:  His father has had congestive heart failure.  His mother  has diabetes and hypertension.   SOCIAL HISTORY:  He is nonsmoker, nondrinker.  Occasionally uses marijuana.   ALLERGIES:  None.   MEDICATIONS:  1.  Potassium 10 daily.  2.  Lasix 80 daily.  3.  Glipizide 10 daily.  4.  Norvasc 10 daily.  5.  Aspirin 81 daily.  6.  Metoprolol 50 b.i.d.   REVIEW OF SYSTEMS:  Reviewed 12 systems.  They are mostly positive for pain  in the abdomen and lower back.  He has been on low salt diet.  Has not had  any blurred or double vision.  No reports of seizures, stroles, or heart  attack.  Not coughing, not wheezing or having a fever.  No history of colon  problems, kidney problems.  No nausea, vomiting, diarrhea, constipation,  hematemesis, melena, or hematuria.  Has some arthritis problems, does not  take anything over-the-counter.  Rest of review is negative.   PHYSICAL EXAMINATION:  VITAL SIGNS:  Temperature 97.8, pulse 86,  respirations 18-24, blood pressure 147/94, 86% oxygen on room air.  GENERAL APPEARANCE:  This is a morbidly overweight African-American male.  HEENT:  Conjunctivae and lids are normal.  Pupils reactive.  He has normal  hearing, mucosa, and pharynx.  NECK:  No thyromegaly or carotid bruits.  LUNGS:  Respiratory effort seems normal.  The lungs are clear to the bases.  No wheezing, rales, or rhonchi.  HEART:  Regular rate and rhythm.  Normal S1 and S2 without murmurs, rubs, or  gallops.  He has 4+ edema up to the thighs.  ABDOMEN:  Impossible to examine since he cannot or does want to lay flat and  is morbidly overweight.  He has no adenopathy.  EXTREMITIES:  Muscle strength is 5/5 with full range of motion.  SKIN:  No rash, lesions, or nodules.  NEUROLOGIC:  Extraocular movements are full.  Sensation is normal.  He is  alert and oriented x3.  Normal memory, judgment, and affect.   LABORATORY WORK SO FAR:  EKG just shows some nonspecific ST-T wave changes  with little T inversions in the inferolateral leads.  Hemoglobin is 11.7,  white count 9.6.  BUN 21,  creatinine 1.7, potassium 3.8.  Cardiac enzymes  are normal.  BNP is less than 30.  Chest x-ray shows mild cardiomegaly and  no other findings.   IMPRESSION:  1.  Severe edema.  This could all be right-sided heart failure but I think      it warrants a more thorough evaluation.  Will get an echocardiogram to      take a look at his heart, VQ and Doppler to rule out deep venous      thrombosis issues.  He needs an evaluation of his liver.  I am going to      add Zaroxolyn and aldactone to his Lasix.  2.  Obstructive sleep apnea and hypoventilation syndrome.  Will be put back      on his CPAP.  3.  Borderline diabetes.  I am going to keep him off the glipizide, just see      what his sugars actually do, whether his A1c is elevated.  4.  Morbid obesity.  5.  Let us take a better look at his liver.  He may have non-alcoholic      steatohepatitis or some other liver issues going on.      Jonna L. Robb Matar, M.D.  Electronically Signed     JLB/MEDQ  D:  07/23/2005  T:  07/25/2005  Job:  [id number]

## 2010-10-22 ENCOUNTER — Ambulatory Visit: Payer: Self-pay | Admitting: Internal Medicine

## 2010-11-25 ENCOUNTER — Ambulatory Visit (INDEPENDENT_AMBULATORY_CARE_PROVIDER_SITE_OTHER): Payer: Medicaid Other | Admitting: Internal Medicine

## 2010-11-25 ENCOUNTER — Encounter: Payer: Self-pay | Admitting: Internal Medicine

## 2010-11-25 VITALS — BP 130/82 | HR 82 | Ht 71.0 in | Wt 374.0 lb

## 2010-11-25 DIAGNOSIS — I428 Other cardiomyopathies: Secondary | ICD-10-CM

## 2010-11-25 DIAGNOSIS — G4730 Sleep apnea, unspecified: Secondary | ICD-10-CM

## 2010-11-25 DIAGNOSIS — J309 Allergic rhinitis, unspecified: Secondary | ICD-10-CM

## 2010-11-25 NOTE — Assessment & Plan Note (Signed)
Well controlled 

## 2010-11-25 NOTE — Assessment & Plan Note (Signed)
He is way too heavy- doubt his OSA is resolved. Explained this and strongly encouraged him to tresume full-time use of CPAP. He says pressure and mask are fine.

## 2010-11-25 NOTE — Progress Notes (Signed)
Subjective:    Patient ID: Dustin Mcclain, male    DOB: October 21, 1965, 45 y.o.   MRN: [id number]  HPI 11/25/10-  69 yoM never smoker followed for OSA/ insomnia, complicated by obesity, HBP, cardiomyopathy, renal insufficiency, DM. Last here - October 23, 2009- note reviewed CPAP only being used a couple of times/ week around when he works out. He can now walk as much as he wants, having improved his endurance. He is really pleased with how much better he is feeling.  Says Cardiomyopathy has improved. - SE[facility] Allergic rhinitis- little problem this spring.    Review of Systems Constitutional:   No-   weight loss, night sweats, fevers, chills, fatigue, lassitude. HEENT:   No-   headaches, difficulty swallowing, tooth/dental problems, sore throat,                  No-   sneezing, itching, ear ache, nasal congestion, post nasal drip,   CV:  No-   chest pain, orthopnea, PND, swelling in lower extremities, anasarca, dizziness, palpitations  GI:  No-   heartburn, indigestion, abdominal pain, nausea, vomiting, diarrhea,                 change in bowel habits, loss of appetite  Resp: No-   Acute shortness of breath with exertion or at rest.  No-  excess mucus,             No-   productive cough,  No non-productive cough,  No-  coughing up of blood.              No-   change in color of mucus.  No- wheezing.    Skin: No-   rash or lesions.  GU: No-   dysuria, change in color of urine, no urgency or frequency.  No- flank pain.  MS:  No-   joint pain or swelling.  No- decreased range of motion.  No- back pain.  Psych:  No- change in mood or affect. No depression or anxiety.  No memory loss.      Objective:   Physical Exam General- Alert, Oriented, Affect-appropriate, Distress- none acute   Very obese Skin- rash-none, lesions- none, excoriation- none Lymphadenopathy- none Head- atraumatic            Eyes- Gross vision intact, PERRLA, conjunctivae clear secretions            Ears- Hearing,  canals normal            Nose- Clear, no- Septal dev, mucus, polyps, erosion, perforation             Throat- Mallampati III , mucosa clear , drainage- none, tonsils- atrophic Neck- flexible , trachea midline, no stridor , thyroid nl, carotid no bruit Chest - symmetrical excursion , unlabored           Heart/CV- RRR , no murmur , no gallop  , no rub, nl s1 s2                           - JVD- none , edema- none, stasis changes- none, varices- none           Lung- clear to P&A, wheeze- none, cough- none , dullness-none, rub- none           Chest wall-  Abd- tender-no, distended-no, bowel sounds-present, HSM- no Br/ Gen/ Rectal- Not done, not indicated Extrem- cyanosis- none, clubbing, none, atrophy- none, strength-  nl Neuro- grossly intact to observation         Assessment & Plan:

## 2010-11-25 NOTE — Patient Instructions (Addendum)
I strongly encourage you to keep using your CPAP:  All night-every night is our goal. We don't want your heart muscle to give out again.

## 2010-11-25 NOTE — Assessment & Plan Note (Signed)
Improvement here is likely crucial to the sense of well-being he is enjoying. I am concerned that he may backslide if he stays off CPAP

## 2010-11-27 ENCOUNTER — Encounter: Payer: Self-pay | Admitting: Internal Medicine

## 2010-11-27 NOTE — Assessment & Plan Note (Signed)
There is much room for improvement, although he thinks he has lost some weight.

## 2011-02-21 LAB — CORTISOL, URINE, FREE: Volume, Urine-CORTUR: 3200

## 2011-02-21 LAB — CBC
HCT: 38.1 — ABNORMAL LOW
HCT: 40
Hemoglobin: 11.4 — ABNORMAL LOW
Hemoglobin: 12.4 — ABNORMAL LOW
MCHC: 31.5
MCHC: 31.5
MCHC: 31.7
MCHC: 31.9
MCV: 69.4 — ABNORMAL LOW
MCV: 70.3 — ABNORMAL LOW
MCV: 70.4 — ABNORMAL LOW
Platelets: 336
Platelets: 350
RBC: 5.19
RBC: 5.49
RBC: 5.58
RDW: 16.5 — ABNORMAL HIGH
WBC: 10.7 — ABNORMAL HIGH
WBC: 10.7 — ABNORMAL HIGH

## 2011-02-21 LAB — CULTURE, RESPIRATORY W GRAM STAIN

## 2011-02-21 LAB — EXPECTORATED SPUTUM ASSESSMENT W GRAM STAIN, RFLX TO RESP C

## 2011-02-21 LAB — BASIC METABOLIC PANEL
BUN: 15
BUN: 17
CO2: 28
Calcium: 9.1
Chloride: 101
Chloride: 104
Creatinine, Ser: 1.58 — ABNORMAL HIGH
GFR calc Af Amer: 59 — ABNORMAL LOW
GFR calc non Af Amer: 49 — ABNORMAL LOW
Glucose, Bld: 88
Potassium: 3.6
Potassium: 4.2
Sodium: 139

## 2011-02-21 LAB — POCT CARDIAC MARKERS
CKMB, poc: 8.2
Myoglobin, poc: 292
Troponin i, poc: 0.07 — ABNORMAL HIGH

## 2011-02-21 LAB — URINE CULTURE

## 2011-02-21 LAB — URINALYSIS, MICROSCOPIC ONLY
Bilirubin Urine: NEGATIVE
Ketones, ur: NEGATIVE
Leukocytes, UA: NEGATIVE
Nitrite: NEGATIVE
Urobilinogen, UA: 1
pH: 6

## 2011-02-21 LAB — COMPREHENSIVE METABOLIC PANEL
ALT: 47
BUN: 15
CO2: 29
Calcium: 9.2
Creatinine, Ser: 1.6 — ABNORMAL HIGH
GFR calc non Af Amer: 48 — ABNORMAL LOW
Glucose, Bld: 109 — ABNORMAL HIGH

## 2011-02-21 LAB — D-DIMER, QUANTITATIVE: D-Dimer, Quant: 1.67 — ABNORMAL HIGH

## 2011-02-21 LAB — DIFFERENTIAL
Eosinophils Absolute: 0.5
Lymphocytes Relative: 24
Lymphs Abs: 2.6
Neutro Abs: 6.4
Neutrophils Relative %: 59

## 2011-02-21 LAB — LIPID PANEL
LDL Cholesterol: 131 — ABNORMAL HIGH
Total CHOL/HDL Ratio: 6.7
Triglycerides: 139
VLDL: 28

## 2011-02-21 LAB — HEMOGLOBIN A1C
Hgb A1c MFr Bld: 6.4 — ABNORMAL HIGH
Mean Plasma Glucose: 151

## 2011-02-21 LAB — CARDIAC PANEL(CRET KIN+CKTOT+MB+TROPI): Troponin I: 0.13 — ABNORMAL HIGH

## 2011-02-21 LAB — RAPID URINE DRUG SCREEN, HOSP PERFORMED
Amphetamines: NOT DETECTED
Tetrahydrocannabinol: NOT DETECTED

## 2011-11-25 ENCOUNTER — Ambulatory Visit: Payer: Medicaid Other | Admitting: Internal Medicine

## 2011-11-28 ENCOUNTER — Ambulatory Visit (INDEPENDENT_AMBULATORY_CARE_PROVIDER_SITE_OTHER): Payer: Medicaid Other | Admitting: Internal Medicine

## 2011-11-28 ENCOUNTER — Encounter: Payer: Self-pay | Admitting: Internal Medicine

## 2011-11-28 VITALS — BP 132/88 | HR 80 | Ht 71.0 in | Wt 386.2 lb

## 2011-11-28 DIAGNOSIS — G4733 Obstructive sleep apnea (adult) (pediatric): Secondary | ICD-10-CM

## 2011-11-28 NOTE — Progress Notes (Signed)
Subjective:    Patient ID: Dustin Mcclain, male    DOB: 10-05-1965, 46 y.o.   MRN: [id number]  HPI 11/25/10-  45 yoM never smoker followed for OSA/ insomnia, complicated by obesity, HBP, cardiomyopathy, renal insufficiency, DM. Last here - October 23, 2009- note reviewed CPAP only being used a couple of times/ week around when he works out. He can now walk as much as he wants, having improved his endurance. He is really pleased with how much better he is feeling.  Says Cardiomyopathy has improved. - SEHV Allergic rhinitis- little problem this spring.   11/28/11-  46 yoM never smoker followed for OSA/ insomnia, complicated by obesity, HBP, cardiomyopathy, renal insufficiency, DM.  Pt states he is wearing CPAP nightly approx 6-7 hrs. Pt denies problems with mask or pressure.   He is pleased now with CPAP/ [facility] and describes good compliance and control. We need to verify his pressure setting with his DME company  ROS-see HPI Constitutional:   No-   weight loss, night sweats, fevers, chills, fatigue, lassitude. HEENT:   No-  headaches, difficulty swallowing, tooth/dental problems, sore throat,       No-  sneezing, itching, ear ache, nasal congestion, post nasal drip,  CV:  No-   chest pain, orthopnea, PND, swelling in lower extremities, anasarca,  dizziness, palpitations Resp: No-   shortness of breath with exertion or at rest.              No-   productive cough,  No non-productive cough,  No- coughing up of blood.              No-   change in color of mucus.  No- wheezing.   Skin: No-   rash or lesions. GI:  No-   heartburn, indigestion, abdominal pain, nausea, vomiting,  GU: . MS:  No-   joint pain or swelling.  Neuro-     nothing unusual Psych:  No- change in mood or affect. No depression or anxiety.  No memory loss. Objective:   Physical Exam General- Alert, Oriented, Affect-appropriate, Distress- none acute   Very obese Skin- rash-none, lesions- none, excoriation-  none Lymphadenopathy- none Head- atraumatic            Eyes- Gross vision intact, PERRLA, conjunctivae clear secretions            Ears- Hearing, canals normal            Nose- Clear, no- Septal dev, mucus, polyps, erosion, perforation             Throat- Mallampati III-IV , mucosa clear , drainage- none, tonsils- atrophic Neck- flexible , trachea midline, no stridor , thyroid nl, carotid no bruit Chest - symmetrical excursion , unlabored           Heart/CV- RRR , no murmur , no gallop  , no rub, nl s1 s2                           - JVD- none , edema- none, stasis changes- none, varices- none           Lung- clear to P&A, wheeze- none, cough- none , dullness-none, rub- none           Chest wall-  Abd- tender-no, distended-no, bowel sounds-present, HSM- no Br/ Gen/ Rectal- Not done, not indicated Extrem- cyanosis- none, clubbing, none, atrophy- none, strength- nl Neuro- grossly intact to observatio  Assessment & Plan:

## 2011-11-28 NOTE — Patient Instructions (Addendum)
We will call [facility] to ask what your current CPAP pressure is for our records  You can call them and ask for directions on how to adjust the humidifier on your machine. If it is turned up a little, it will put more moisture into the air in your mask. You can also try using an otc nasal saline gel as needed for dry nose.

## 2011-11-30 NOTE — Assessment & Plan Note (Signed)
No progress. He understands this is a significant problem for his several medical problems.

## 2011-11-30 NOTE — Assessment & Plan Note (Signed)
We discussed CPAP pressure and comfort issues. He will call [facility] for instructions on adjusting his humidifier

## 2012-11-27 ENCOUNTER — Ambulatory Visit: Payer: Medicaid Other | Admitting: Internal Medicine

## 2012-12-17 ENCOUNTER — Encounter: Payer: Self-pay | Admitting: Internal Medicine

## 2012-12-17 ENCOUNTER — Ambulatory Visit (INDEPENDENT_AMBULATORY_CARE_PROVIDER_SITE_OTHER): Payer: Medicaid Other | Admitting: Internal Medicine

## 2012-12-17 VITALS — BP 138/86 | HR 77 | Ht 71.0 in | Wt 361.0 lb

## 2012-12-17 DIAGNOSIS — J309 Allergic rhinitis, unspecified: Secondary | ICD-10-CM

## 2012-12-17 DIAGNOSIS — G4733 Obstructive sleep apnea (adult) (pediatric): Secondary | ICD-10-CM

## 2012-12-17 DIAGNOSIS — J302 Other seasonal allergic rhinitis: Secondary | ICD-10-CM

## 2012-12-17 NOTE — Progress Notes (Signed)
Subjective:    Patient ID: Dustin Mcclain, male    DOB: 11/06/1965, 47 y.o.   MRN: [id number]  HPI 11/25/10-  38 yoM never smoker followed for OSA/ insomnia, complicated by obesity, HBP, cardiomyopathy, renal insufficiency, DM. Last here - October 23, 2009- note reviewed CPAP only being used a couple of times/ week around when he works out. He can now walk as much as he wants, having improved his endurance. He is really pleased with how much better he is feeling.  Says Cardiomyopathy has improved. - [facility] Allergic rhinitis- little problem this spring.   11/28/11-  46 yoM never smoker followed for OSA/ insomnia, complicated by obesity, HBP, cardiomyopathy, renal insufficiency, DM.  Pt states he is wearing CPAP nightly approx 6-7 hrs. Pt denies problems with mask or pressure.   He is pleased now with CPAP/ [facility] and describes good compliance and control. We need to verify his pressure setting with his DME company  12/17/12- 46 yoM never smoker followed for OSA/ insomnia, complicated by obesity, HBP, cardiomyopathy, renal insufficiency, DM. FOLLOWS [id number] CPAP 14/ [facility] every night for at least 6 hours; pressure working well for patient; needs new supplies through [facility]. Compliance and comfort with CPAP per good at this pressure. Using a fullface mask with humidifier. Sleeping well without medication.  ROS-see HPI Constitutional:   No-   weight loss, night sweats, fevers, chills, fatigue, lassitude. HEENT:   No-  headaches, difficulty swallowing, tooth/dental problems, sore throat,       No-  sneezing, itching, ear ache, nasal congestion, post nasal drip,  CV:  No-   chest pain, orthopnea, PND, swelling in lower extremities, anasarca,  dizziness, palpitations Resp: No-   shortness of breath with exertion or at rest.              No-   productive cough,  No non-productive cough,  No- coughing up of blood.              No-   change in color of mucus.  No- wheezing.   Skin: No-   rash or  lesions. GI:  No-   heartburn, indigestion, abdominal pain, nausea, vomiting,  GU: . MS:  No-   joint pain or swelling.  Neuro-     nothing unusual Psych:  No- change in mood or affect. No depression or anxiety.  No memory loss. Objective:   Physical Exam General- Alert, Oriented, Affect-appropriate, Distress- none acute   Very obese Skin- rash-none, lesions- none, excoriation- none Lymphadenopathy- none Head- atraumatic            Eyes- Gross vision intact, PERRLA, conjunctivae clear secretions            Ears- Hearing, canals normal            Nose- Clear, no- Septal dev, mucus, polyps, erosion, perforation             Throat- Mallampati III-IV , mucosa clear , drainage- none, tonsils- atrophic Neck- flexible , trachea midline, no stridor , thyroid nl, carotid no bruit Chest - symmetrical excursion , unlabored           Heart/CV- RRR , no murmur , no gallop  , no rub, nl s1 s2                           - JVD- none , edema- none, stasis changes- none, varices- none  Lung- clear to P&A, wheeze- none, cough- none , dullness-none, rub- none           Chest wall-  Abd-  Br/ Gen/ Rectal- Not done, not indicated Extrem- cyanosis- none, clubbing, none, atrophy- none, strength- nl Neuro- grossly intact to observatio  Assessment & Plan:

## 2012-12-17 NOTE — Patient Instructions (Addendum)
We can continue CPAP 14/ [facility]  Please call as needed 

## 2013-01-01 NOTE — Assessment & Plan Note (Signed)
Good compliance and control. Continue CPAP.

## 2013-01-01 NOTE — Assessment & Plan Note (Signed)
We discussed interaction of obesity with his medical problems including sleep apnea. Offered referral to the bariatric center when he is willing

## 2013-02-05 ENCOUNTER — Emergency Department (HOSPITAL_BASED_OUTPATIENT_CLINIC_OR_DEPARTMENT_OTHER): Payer: Medicaid Other

## 2013-02-05 ENCOUNTER — Emergency Department (HOSPITAL_BASED_OUTPATIENT_CLINIC_OR_DEPARTMENT_OTHER)
Admission: EM | Admit: 2013-02-05 | Discharge: 2013-02-05 | Disposition: A | Discharge status: Home / Self Care | Payer: Medicaid Other | Attending: Emergency Medicine | Admitting: Emergency Medicine

## 2013-02-05 ENCOUNTER — Encounter (HOSPITAL_BASED_OUTPATIENT_CLINIC_OR_DEPARTMENT_OTHER): Payer: Self-pay | Admitting: *Deleted

## 2013-02-05 DIAGNOSIS — M79609 Pain in unspecified limb: Secondary | ICD-10-CM | POA: Insufficient documentation

## 2013-02-05 DIAGNOSIS — Z79899 Other long term (current) drug therapy: Secondary | ICD-10-CM | POA: Insufficient documentation

## 2013-02-05 DIAGNOSIS — F329 Major depressive disorder, single episode, unspecified: Secondary | ICD-10-CM | POA: Insufficient documentation

## 2013-02-05 DIAGNOSIS — F411 Generalized anxiety disorder: Secondary | ICD-10-CM | POA: Insufficient documentation

## 2013-02-05 DIAGNOSIS — F3289 Other specified depressive episodes: Secondary | ICD-10-CM | POA: Insufficient documentation

## 2013-02-05 DIAGNOSIS — M79671 Pain in right foot: Secondary | ICD-10-CM

## 2013-02-05 DIAGNOSIS — I10 Essential (primary) hypertension: Secondary | ICD-10-CM | POA: Insufficient documentation

## 2013-02-05 DIAGNOSIS — Z87448 Personal history of other diseases of urinary system: Secondary | ICD-10-CM | POA: Insufficient documentation

## 2013-02-05 MED ORDER — IBUPROFEN 800 MG PO TABS: 800.0000 mg | ORAL_TABLET | Freq: Three times a day (TID) | ORAL | Status: DC

## 2013-02-05 MED ORDER — OXYCODONE-ACETAMINOPHEN 5-325 MG PO TABS: 1.0000 | ORAL_TABLET | ORAL | Status: DC | PRN

## 2013-02-05 MED ORDER — OXYCODONE-ACETAMINOPHEN 5-325 MG PO TABS
2.0000 | ORAL_TABLET | Freq: Once | ORAL | Status: AC
  Administered 2013-02-05: 2 via ORAL
  Filled 2013-02-05: qty 2

## 2013-02-05 NOTE — ED Notes (Signed)
Patient transported to X-ray 

## 2013-02-05 NOTE — ED Provider Notes (Signed)
TIME SEEN: 11:00 AM  CHIEF COMPLAINT: Right foot pain  HPI: Patient is a 47 y.o. male with a history of morbid obesity, hypertension, obstructive sleep apnea who presents emergency department with right foot pain that started 5 days ago. He reports that the pain started after he got into a car quickly and feels that he may have turned his the wrong way. He states that he has also been working out frequently and states he has had some pain with working out. His pain is an aching, throbbing and is moderate in nature without radiation. It is localized to the lateral aspect of the right foot. Denies any numbness, tingling or focal weakness. He has had swelling that started last night. No erythema, warmth or fever. States his pain is worse with walking and working out. No alleviating factors. Denies a history of DVT or PE. No calf pain or swelling. No prolonged immobilization, recent surgery, fracture, trauma.  ROS: See HPI Constitutional: no fever  Eyes: no drainage  ENT: no runny nose   Cardiovascular:  no chest pain  Resp: no SOB  GI: no vomiting GU: no dysuria Integumentary: no rash  Allergy: no hives  Musculoskeletal: no leg swelling  Neurological: no slurred speech ROS otherwise negative  PAST MEDICAL HISTORY/PAST SURGICAL HISTORY:  Past Medical History  Diagnosis Date  . Morbid obesity   . Allergic rhinitis, cause unspecified   . Unspecified sleep apnea   . Unspecified disorder resulting from impaired renal function   . Other primary cardiomyopathies   . Unspecified essential hypertension   . Insomnia, unspecified   . Anxiety state, unspecified   . Depressive disorder, not elsewhere classified     MEDICATIONS:  Prior to Admission medications   Medication Sig Start Date End Date Taking? Authorizing Provider  carvedilol (COREG) 25 MG tablet Take 25 mg by mouth 2 (two) times daily with a meal.     Historical Provider, MD  clonazePAM (KLONOPIN) 0.5 MG tablet Take 0.5 mg by mouth  at bedtime as needed.    Historical Provider, MD  diltiazem (CARDIZEM CD) 360 MG 24 hr capsule Take 1 tablet by mouth Daily. 10/07/11   Historical Provider, MD  esomeprazole (NEXIUM) 40 MG capsule Take 40 mg by mouth daily as needed.     Historical Provider, MD  furosemide (LASIX) 80 MG tablet Take 80 mg by mouth 2 (two) times daily.      Historical Provider, MD  hydrALAZINE (APRESOLINE) 25 MG tablet Take 25 mg by mouth 3 (three) times daily.    Historical Provider, MD  Liraglutide (VICTOZA) 18 MG/3ML SOPN Inject 1.2 Units into the skin daily.    Historical Provider, MD  PARoxetine (PAXIL) 10 MG tablet Take 20 mg by mouth as needed.     Historical Provider, MD  potassium chloride SA (K-DUR,KLOR-CON) 20 MEQ tablet Take 20 mEq by mouth 2 (two) times daily.      Historical Provider, MD  spironolactone (ALDACTONE) 50 MG tablet Take 50 mg by mouth daily.      Historical Provider, MD    ALLERGIES:  No Known Allergies  SOCIAL HISTORY:  History  Substance Use Topics  . Smoking status: Never Smoker   . Smokeless tobacco: Never Used  . Alcohol Use: Yes     Comment: socially    FAMILY HISTORY: Family History  Problem Relation Age of Onset  . Heart disease      EXAM: BP 179/104  Pulse 90  Temp(Src) 98.4 F (36.9 C) (Oral)  Resp 20  Ht 5\' 11"  (1.803 m)  Wt 355 lb (161.027 kg)  BMI 49.53 kg/m2  SpO2 100% CONSTITUTIONAL: Alert and oriented and responds appropriately to questions. Well-appearing; well-nourished HEAD: Normocephalic EYES: Conjunctivae clear, PERRL ENT: normal nose; no rhinorrhea; moist mucous membranes; pharynx without lesions noted NECK: Supple, no meningismus, no LAD  CARD: RRR; S1 and S2 appreciated; no murmurs, no clicks, no rubs, no gallops RESP: Normal chest excursion without splinting or tachypnea; breath sounds clear and equal bilaterally; no wheezes, no rhonchi, no rales,  ABD/GI: Normal bowel sounds; non-distended; soft, non-tender, no rebound, no guarding BACK:   The back appears normal and is non-tender to palpation, there is no CVA tenderness EXT: Patient has mild swelling to the lateral aspect of his right foot with tenderness to palpation over the fifth metatarsal, full range of motion in his toes, ankle, knee, no fibular head tenderness, no medial or lateral malleoli tenderness, 2+ DP pulses bilaterally, no calf swelling or pain, Normal ROM in all joints; otherwise extremities are non-tender to palpation; no edema; normal capillary refill; no cyanosis    SKIN: Normal color for age and race; warm NEURO: Moves all extremities equally, sensation to light touch intact diffusely PSYCH: The patient's mood and manner are appropriate. Grooming and personal hygiene are appropriate.  MEDICAL DECISION MAKING: Patient with right lateral foot pain and swelling after a possible twisting injury when getting into a cargo van 5 days ago. I am not concerned for DVT at this time as his pain or swelling and has no risk factors for DVT. His Well's score is 0.  Will give him pain medication and right foot x-ray to evaluate for bony injury.  ED PROGRESS: X-ray is negative. We'll discharge home with supportive care instructions, orthopedic followup, pain medication. Patient verbalizes understanding is comfortable with this plan. He feels he is able to angulate and does not want crutches at this time. I do not feel he would benefit from a walking boot.     Layla Maw Lanyla Costello, DO 02/05/13 1146

## 2013-02-05 NOTE — ED Notes (Signed)
Pt reports (R) foot pain x 5 days.  Denies known injury.  Swelling noted to lateral side of foot, pt tender on palpation.

## 2013-08-06 ENCOUNTER — Encounter (HOSPITAL_BASED_OUTPATIENT_CLINIC_OR_DEPARTMENT_OTHER): Payer: Self-pay | Admitting: Emergency Medicine

## 2013-08-06 ENCOUNTER — Emergency Department (HOSPITAL_BASED_OUTPATIENT_CLINIC_OR_DEPARTMENT_OTHER)
Admission: EM | Admit: 2013-08-06 | Discharge: 2013-08-06 | Disposition: A | Discharge status: Home / Self Care | Payer: Medicaid Other | Attending: Emergency Medicine | Admitting: Emergency Medicine

## 2013-08-06 ENCOUNTER — Emergency Department (HOSPITAL_BASED_OUTPATIENT_CLINIC_OR_DEPARTMENT_OTHER): Payer: Medicaid Other

## 2013-08-06 DIAGNOSIS — E119 Type 2 diabetes mellitus without complications: Secondary | ICD-10-CM | POA: Insufficient documentation

## 2013-08-06 DIAGNOSIS — M2540 Effusion, unspecified joint: Secondary | ICD-10-CM | POA: Diagnosis present

## 2013-08-06 DIAGNOSIS — Z862 Personal history of diseases of the blood and blood-forming organs and certain disorders involving the immune mechanism: Secondary | ICD-10-CM | POA: Insufficient documentation

## 2013-08-06 DIAGNOSIS — F3289 Other specified depressive episodes: Secondary | ICD-10-CM | POA: Insufficient documentation

## 2013-08-06 DIAGNOSIS — F411 Generalized anxiety disorder: Secondary | ICD-10-CM | POA: Insufficient documentation

## 2013-08-06 DIAGNOSIS — N183 Chronic kidney disease, stage 3 unspecified: Secondary | ICD-10-CM | POA: Insufficient documentation

## 2013-08-06 DIAGNOSIS — M25561 Pain in right knee: Secondary | ICD-10-CM

## 2013-08-06 DIAGNOSIS — I129 Hypertensive chronic kidney disease with stage 1 through stage 4 chronic kidney disease, or unspecified chronic kidney disease: Secondary | ICD-10-CM | POA: Insufficient documentation

## 2013-08-06 DIAGNOSIS — Z8709 Personal history of other diseases of the respiratory system: Secondary | ICD-10-CM | POA: Insufficient documentation

## 2013-08-06 DIAGNOSIS — Y929 Unspecified place or not applicable: Secondary | ICD-10-CM | POA: Insufficient documentation

## 2013-08-06 DIAGNOSIS — S99919A Unspecified injury of unspecified ankle, initial encounter: Principal | ICD-10-CM

## 2013-08-06 DIAGNOSIS — S99929A Unspecified injury of unspecified foot, initial encounter: Principal | ICD-10-CM

## 2013-08-06 DIAGNOSIS — Y9389 Activity, other specified: Secondary | ICD-10-CM | POA: Insufficient documentation

## 2013-08-06 DIAGNOSIS — Z791 Long term (current) use of non-steroidal anti-inflammatories (NSAID): Secondary | ICD-10-CM | POA: Insufficient documentation

## 2013-08-06 DIAGNOSIS — IMO0002 Reserved for concepts with insufficient information to code with codable children: Secondary | ICD-10-CM | POA: Insufficient documentation

## 2013-08-06 DIAGNOSIS — G4700 Insomnia, unspecified: Secondary | ICD-10-CM | POA: Insufficient documentation

## 2013-08-06 DIAGNOSIS — S8990XA Unspecified injury of unspecified lower leg, initial encounter: Secondary | ICD-10-CM | POA: Insufficient documentation

## 2013-08-06 DIAGNOSIS — Z79899 Other long term (current) drug therapy: Secondary | ICD-10-CM | POA: Insufficient documentation

## 2013-08-06 DIAGNOSIS — F329 Major depressive disorder, single episode, unspecified: Secondary | ICD-10-CM | POA: Insufficient documentation

## 2013-08-06 HISTORY — DX: Chronic kidney disease, stage 3 unspecified: N18.30

## 2013-08-06 HISTORY — DX: Type 2 diabetes mellitus without complications: E11.9

## 2013-08-06 HISTORY — DX: Pain in right knee: M25.561

## 2013-08-06 HISTORY — DX: Anemia in chronic kidney disease: D63.1

## 2013-08-06 HISTORY — DX: Chronic kidney disease, stage 3 (moderate): N18.3

## 2013-08-06 HISTORY — DX: Effusion, unspecified joint: M25.40

## 2013-08-06 MED ORDER — OXYCODONE-ACETAMINOPHEN 5-325 MG PO TABS: 1.0000 | ORAL_TABLET | Freq: Four times a day (QID) | ORAL | Status: DC | PRN

## 2013-08-06 MED ORDER — OXYCODONE-ACETAMINOPHEN 5-325 MG PO TABS
2.0000 | ORAL_TABLET | Freq: Once | ORAL | Status: AC
  Administered 2013-08-06: 2 via ORAL
  Filled 2013-08-06: qty 2

## 2013-08-06 NOTE — ED Provider Notes (Signed)
CSN: [id number]     Arrival date & time 08/06/13  1001 History   First MD Initiated Contact with Patient 08/06/13 1013     Chief Complaint  Patient presents with  . Knee Pain    right     (Consider location/radiation/quality/duration/timing/severity/associated sxs/prior Treatment) Patient is a 48 y.o. male presenting with knee pain. The history is provided by the patient.  Knee Pain Location:  Knee Time since incident:  2 weeks Injury: yes   Mechanism of injury comment:  Sudden pain when kicking the covers off his bed Knee location:  R knee Pain details:    Quality:  Aching   Radiates to:  Does not radiate   Severity:  Moderate   Onset quality:  Sudden   Duration:  2 weeks   Timing:  Constant   Progression:  Worsening Chronicity:  New Dislocation: no   Foreign body present:  No foreign bodies Prior injury to area:  No Relieved by:  Nothing Worsened by:  Bearing weight Ineffective treatments:  NSAIDs Associated symptoms: no fever, no muscle weakness, no neck pain, no numbness and no swelling     Past Medical History  Diagnosis Date  . Morbid obesity   . Allergic rhinitis, cause unspecified   . Unspecified sleep apnea   . Unspecified disorder resulting from impaired renal function   . Other primary cardiomyopathies   . Unspecified essential hypertension   . Insomnia, unspecified   . Anxiety state, unspecified   . Depressive disorder, not elsewhere classified   . Anemia in stage 3 chronic kidney disease   . Diabetes mellitus without complication    Past Surgical History  Procedure Laterality Date  . Appendectomy    . Patellar tendon repair      left   Family History  Problem Relation Age of Onset  . Heart disease     History  Substance Use Topics  . Smoking status: Never Smoker   . Smokeless tobacco: Never Used  . Alcohol Use: Yes     Comment: socially    Review of Systems  Constitutional: Negative for fever.  HENT: Negative for drooling and  rhinorrhea.   Eyes: Negative for pain.  Respiratory: Negative for cough and shortness of breath.   Cardiovascular: Negative for chest pain and leg swelling.  Gastrointestinal: Negative for nausea, vomiting, abdominal pain and diarrhea.  Genitourinary: Negative for dysuria and hematuria.  Musculoskeletal: Negative for gait problem and neck pain.  Skin: Negative for color change.  Neurological: Negative for numbness and headaches.  Hematological: Negative for adenopathy.  Psychiatric/Behavioral: Negative for behavioral problems.  All other systems reviewed and are negative.      Allergies  Review of patient's allergies indicates no known allergies.  Home Medications   Current Outpatient Rx  Name  Route  Sig  Dispense  Refill  . carvedilol (COREG) 25 MG tablet   Oral   Take 25 mg by mouth 2 (two) times daily with a meal.          . clonazePAM (KLONOPIN) 0.5 MG tablet   Oral   Take 0.5 mg by mouth at bedtime as needed.         . diltiazem (CARDIZEM CD) 360 MG 24 hr capsule   Oral   Take 1 tablet by mouth Daily.         Marland Kitchen esomeprazole (NEXIUM) 40 MG capsule   Oral   Take 40 mg by mouth daily as needed.          Marland Kitchen  furosemide (LASIX) 80 MG tablet   Oral   Take 80 mg by mouth 2 (two) times daily.           . hydrALAZINE (APRESOLINE) 25 MG tablet   Oral   Take 25 mg by mouth 3 (three) times daily.         . Liraglutide (VICTOZA) 18 MG/3ML SOPN   Subcutaneous   Inject 1.2 Units into the skin daily.         Marland Kitchen PARoxetine (PAXIL) 10 MG tablet   Oral   Take 20 mg by mouth as needed.          . potassium chloride SA (K-DUR,KLOR-CON) 20 MEQ tablet   Oral   Take 20 mEq by mouth 2 (two) times daily.           Marland Kitchen spironolactone (ALDACTONE) 50 MG tablet   Oral   Take 50 mg by mouth daily.           Marland Kitchen ibuprofen (ADVIL,MOTRIN) 800 MG tablet   Oral   Take 1 tablet (800 mg total) by mouth 3 (three) times daily.   21 tablet   0   .  oxyCODONE-acetaminophen (PERCOCET/ROXICET) 5-325 MG per tablet   Oral   Take 1 tablet by mouth every 4 (four) hours as needed for pain.   15 tablet   0    BP 155/103  Temp(Src) 97.9 F (36.6 C) (Oral)  Resp 20  Ht 5\' 11"  (1.803 m)  Wt 370 lb (167.831 kg)  BMI 51.63 kg/m2  SpO2 100% Physical Exam  Nursing note and vitals reviewed. Constitutional: He is oriented to person, place, and time. He appears well-developed and well-nourished.  HENT:  Head: Normocephalic and atraumatic.  Right Ear: External ear normal.  Left Ear: External ear normal.  Nose: Nose normal.  Mouth/Throat: Oropharynx is clear and moist. No oropharyngeal exudate.  Eyes: Conjunctivae and EOM are normal. Pupils are equal, round, and reactive to light.  Neck: Normal range of motion. Neck supple.  Cardiovascular: Normal rate, regular rhythm, normal heart sounds and intact distal pulses.  Exam reveals no gallop and no friction rub.   No murmur heard. Pulmonary/Chest: Effort normal and breath sounds normal. No respiratory distress. He has no wheezes.  Abdominal: Soft. Bowel sounds are normal. He exhibits no distension. There is no tenderness. There is no rebound and no guarding.  Musculoskeletal: Normal range of motion. He exhibits no edema.  Mild tenderness to palpation of the right anterior knee. Mild effusion noted with palpation. No erythema noted. Mild warmth.   Mildly decreased range of motion of the right knee due to pain.  Normal strength and sensation in the right lower extremity.  2+ distal pulses in bilateral lower extremities.  Neurological: He is alert and oriented to person, place, and time.  Skin: Skin is warm and dry.  Psychiatric: He has a normal mood and affect. His behavior is normal.    ED Course  Procedures (including critical care time) Labs Review Labs Reviewed - No data to display Imaging Review Dg Knee Complete 4 Views Right  08/06/2013   CLINICAL DATA:  Knee pain.  No injury.   EXAM: RIGHT KNEE - COMPLETE 4+ VIEW  COMPARISON:  No comparisons  FINDINGS: Large effusion is present. Moderate patellofemoral osteoarthritis. Mild medial and lateral compartment osteoarthritis. Mild lateral subluxation of the tibia on the femur. Calcifications are present in the proximal leg, probably representing phlebolith.  IMPRESSION: No acute osseous abnormality. Patellofemoral compartment predominant  tricompartmental osteoarthritis with large effusion.   Electronically Signed   By: Dereck Ligas M.D.   On: 08/06/2013 11:18     EKG Interpretation None      MDM   Final diagnoses:  Right knee pain  Joint effusion    10:32 AM 48 y.o. male w/ hx of left knee surg, DM who presents with right knee pain which began on March 14 when he kicked the covers off his bed and had sudden onset knee pain. He notes that the swelling has improved but the pain persists and seems to be worse in the last 2 days. He denies any fevers or previous surgeries in the right knee. He is afebrile and vital signs are unremarkable here. He states that he does have a history of gout. Will get plain film imaging and pain control. Likely gout vs traumatic injury.   12:54 PM: Pt continues to appear well. I offered arthrocentesis although I do not think it is mandatory to r/o septic joint as I have a very low suspicion for this. He declined and would prefer to f/u w/ his orthopedist. I think this is reasonable. Will provide stronger pain control and rec continued use of knee brace.  I have discussed the diagnosis/risks/treatment options with the patient and believe the pt to be eligible for discharge home to follow-up with his orthopedist in the next few days. We also discussed returning to the ED immediately if new or worsening sx occur. We discussed the sx which are most concerning (e.g., worsening pain, fever, erythema of joint, inc swelling) that necessitate immediate return. Medications administered to the patient during  their visit and any new prescriptions provided to the patient are listed below.  Medications given during this visit Medications  oxyCODONE-acetaminophen (PERCOCET/ROXICET) 5-325 MG per tablet 2 tablet (2 tablets Oral Given 08/06/13 1136)    New Prescriptions   OXYCODONE-ACETAMINOPHEN (PERCOCET) 5-325 MG PER TABLET    Take 1-2 tablets by mouth every 6 (six) hours as needed for moderate pain.     Blanchard Kelch, MD 08/07/13 [phone]

## 2013-08-06 NOTE — ED Notes (Signed)
Patient states approximately two weeks ago, he was getting out of bed and kicked the covers off of his feet and felt a pop in his right knee.  Initially had a lot of swelling, swelling improved, but continues to have pain.

## 2013-08-06 NOTE — Discharge Instructions (Signed)
Arthralgia  Arthralgia is joint pain. A joint is a place where two bones meet. Joint pain can happen for many reasons. The joint can be bruised, stiff, infected, or weak from aging. Pain usually goes away after resting and taking medicine for soreness.   HOME CARE  · Rest the joint as told by your doctor.  · Keep the sore joint raised (elevated) for the first 24 hours.  · Put ice on the joint area.  · Put ice in a plastic bag.  · Place a towel between your skin and the bag.  · Leave the ice on for 15-20 minutes, 03-04 times a day.  · Wear your splint, casting, elastic bandage, or sling as told by your doctor.  · Only take medicine as told by your doctor. Do not take aspirin.  · Use crutches as told by your doctor. Do not put weight on the joint until told to by your doctor.  GET HELP RIGHT AWAY IF:   · You have bruising, puffiness (swelling), or more pain.  · Your fingers or toes turn blue or start to lose feeling (numb).  · Your medicine does not lessen the pain.  · Your pain becomes severe.  · You have a temperature by mouth above 102° F (38.9° C), not controlled by medicine.  · You cannot move or use the joint.  MAKE SURE YOU:   · Understand these instructions.  · Will watch your condition.  · Will get help right away if you are not doing well or get worse.  Document Released: 04/13/2009 Document Revised: 07/18/2011 Document Reviewed: 04/13/2009  [employer]® Patient Information ©2014 [employer].

## 2013-12-17 ENCOUNTER — Ambulatory Visit (INDEPENDENT_AMBULATORY_CARE_PROVIDER_SITE_OTHER): Payer: Medicaid Other | Admitting: Internal Medicine

## 2013-12-17 ENCOUNTER — Encounter: Payer: Self-pay | Admitting: Internal Medicine

## 2013-12-17 ENCOUNTER — Encounter (INDEPENDENT_AMBULATORY_CARE_PROVIDER_SITE_OTHER): Payer: Self-pay

## 2013-12-17 VITALS — BP 148/88 | HR 83 | Ht 71.0 in | Wt 378.2 lb

## 2013-12-17 DIAGNOSIS — G4700 Insomnia, unspecified: Secondary | ICD-10-CM

## 2013-12-17 DIAGNOSIS — G4733 Obstructive sleep apnea (adult) (pediatric): Secondary | ICD-10-CM

## 2013-12-17 MED ORDER — CLONAZEPAM 0.5 MG PO TABS: 0.5000 mg | ORAL_TABLET | Freq: Every evening | ORAL | Status: DC | PRN

## 2013-12-17 NOTE — Progress Notes (Signed)
Subjective:    Patient ID: Dustin Mcclain, male    DOB: 11-03-1965, 48 y.o.   MRN: [id number]  HPI 11/25/10-  55 yoM never smoker followed for OSA/ insomnia, complicated by obesity, HBP, cardiomyopathy, renal insufficiency, DM. Last here - October 23, 2009- note reviewed CPAP only being used a couple of times/ week around when he works out. He can now walk as much as he wants, having improved his endurance. He is really pleased with how much better he is feeling.  Says Cardiomyopathy has improved. - [facility] Allergic rhinitis- little problem this spring.   11/28/11-  14 yoM never smoker followed for OSA/ insomnia, complicated by obesity, HBP, cardiomyopathy, renal insufficiency, DM.  Pt states he is wearing CPAP nightly approx 6-7 hrs. Pt denies problems with mask or pressure.   He is pleased now with CPAP/ Advanced and describes good compliance and control. We need to verify his pressure setting with his DME company  12/17/12- 46 yoM never smoker followed for OSA/ insomnia, complicated by obesity, HBP, cardiomyopathy, renal insufficiency, DM. FOLLOWS WIO:XBDZH CPAP 14/ Advanced every night for at least 6 hours; pressure working well for patient; needs new supplies through [facility]. Compliance and comfort with CPAP per good at this pressure. Using a fullface mask with humidifier. Sleeping well without medication.  12/17/13- 72 yoM never smoker followed for OSA/ insomnia, complicated by obesity, HBP, cardiomyopathy, renal insufficiency, DM. FOLLOWS FOR: Wears CPAP14/ Advanced every night for about 5.5 to 7.5 hours; pressure works well for patient; DME is [facility]. No snoring, feels better with CPAP. Clonazepam does help occasionally for insomnia.  ROS-see HPI Constitutional:   No-   weight loss, night sweats, fevers, chills, fatigue, lassitude. HEENT:   No-  headaches, difficulty swallowing, tooth/dental problems, sore throat,       No-  sneezing, itching, ear ache, nasal congestion, post nasal drip,  CV:   No-   chest pain, orthopnea, PND, swelling in lower extremities, anasarca,  dizziness, palpitations Resp: No-   shortness of breath with exertion or at rest.              No-   productive cough,  No non-productive cough,  No- coughing up of blood.              No-   change in color of mucus.  No- wheezing.   Skin: No-   rash or lesions. GI:  No-   heartburn, indigestion, abdominal pain, nausea, vomiting,  GU: . MS:  No-   joint pain or swelling.  Neuro-     nothing unusual Psych:  No- change in mood or affect. No depression or anxiety.  No memory loss. Objective:   Physical Exam General- Alert, Oriented, Affect-appropriate, Distress- none acute   Very obese Skin- rash-none, lesions- none, excoriation- none Lymphadenopathy- none Head- atraumatic            Eyes- Gross vision intact, PERRLA, conjunctivae clear secretions            Ears- Hearing, canals normal            Nose- Clear, no- Septal dev, mucus, polyps, erosion, perforation             Throat- Mallampati III-IV , mucosa clear , drainage- none, tonsils- atrophic Neck- flexible , trachea midline, no stridor , thyroid nl, carotid no bruit Chest - symmetrical excursion , unlabored           Heart/CV- RRR , no murmur , no gallop  ,  no rub, nl s1 s2                           - JVD- none , edema- none, stasis changes- none, varices- none           Lung- clear to P&A, wheeze- none, cough- none , dullness-none, rub- none           Chest wall-  Abd-  Br/ Gen/ Rectal- Not done, not indicated Extrem- cyanosis- none, clubbing, none, atrophy- none, strength- nl Neuro- grossly intact to observatio  Assessment & Plan:

## 2013-12-17 NOTE — Patient Instructions (Signed)
We can continue CPAP 14/ Advanced  Script refilling clonazepam for help sleeping if needed  Please call as needed

## 2014-01-29 ENCOUNTER — Encounter (HOSPITAL_BASED_OUTPATIENT_CLINIC_OR_DEPARTMENT_OTHER): Payer: Self-pay | Admitting: Emergency Medicine

## 2014-01-29 ENCOUNTER — Emergency Department (HOSPITAL_BASED_OUTPATIENT_CLINIC_OR_DEPARTMENT_OTHER)
Admission: EM | Admit: 2014-01-29 | Discharge: 2014-01-29 | Disposition: A | Discharge status: Home / Self Care | Payer: Medicaid Other | Attending: Emergency Medicine | Admitting: Emergency Medicine

## 2014-01-29 DIAGNOSIS — L089 Local infection of the skin and subcutaneous tissue, unspecified: Secondary | ICD-10-CM | POA: Diagnosis not present

## 2014-01-29 DIAGNOSIS — IMO0002 Reserved for concepts with insufficient information to code with codable children: Secondary | ICD-10-CM | POA: Insufficient documentation

## 2014-01-29 DIAGNOSIS — F3289 Other specified depressive episodes: Secondary | ICD-10-CM | POA: Diagnosis not present

## 2014-01-29 DIAGNOSIS — N183 Chronic kidney disease, stage 3 unspecified: Secondary | ICD-10-CM | POA: Insufficient documentation

## 2014-01-29 DIAGNOSIS — N039 Chronic nephritic syndrome with unspecified morphologic changes: Secondary | ICD-10-CM | POA: Diagnosis not present

## 2014-01-29 DIAGNOSIS — I129 Hypertensive chronic kidney disease with stage 1 through stage 4 chronic kidney disease, or unspecified chronic kidney disease: Secondary | ICD-10-CM | POA: Diagnosis not present

## 2014-01-29 DIAGNOSIS — F329 Major depressive disorder, single episode, unspecified: Secondary | ICD-10-CM | POA: Diagnosis not present

## 2014-01-29 DIAGNOSIS — E119 Type 2 diabetes mellitus without complications: Secondary | ICD-10-CM | POA: Insufficient documentation

## 2014-01-29 DIAGNOSIS — Z79899 Other long term (current) drug therapy: Secondary | ICD-10-CM | POA: Insufficient documentation

## 2014-01-29 DIAGNOSIS — Z8709 Personal history of other diseases of the respiratory system: Secondary | ICD-10-CM | POA: Insufficient documentation

## 2014-01-29 DIAGNOSIS — F411 Generalized anxiety disorder: Secondary | ICD-10-CM | POA: Diagnosis not present

## 2014-01-29 DIAGNOSIS — D631 Anemia in chronic kidney disease: Secondary | ICD-10-CM | POA: Insufficient documentation

## 2014-01-29 LAB — BASIC METABOLIC PANEL
ANION GAP: 15 (ref 5–15)
BUN: 21 mg/dL (ref 6–23)
CALCIUM: 9.6 mg/dL (ref 8.4–10.5)
CO2: 24 meq/L (ref 19–32)
CREATININE: 1.5 mg/dL — AB (ref 0.50–1.35)
Chloride: 96 mEq/L (ref 96–112)
GFR, EST AFRICAN AMERICAN: 63 mL/min — AB (ref >90)
GFR, EST NON AFRICAN AMERICAN: 54 mL/min — AB (ref >90)
Glucose, Bld: 362 mg/dL — ABNORMAL HIGH (ref 70–99)
Potassium: 3.9 mEq/L (ref 3.7–5.3)
Sodium: 135 mEq/L — ABNORMAL LOW (ref 137–147)

## 2014-01-29 LAB — CBC WITH DIFFERENTIAL/PLATELET
BASOS ABS: 0 10*3/uL (ref 0.0–0.1)
Basophils Relative: 0 % (ref 0–1)
EOS ABS: 0.4 10*3/uL (ref 0.0–0.7)
Eosinophils Relative: 4 % (ref 0–5)
HCT: 38 % — ABNORMAL LOW (ref 39.0–52.0)
Hemoglobin: 12.5 g/dL — ABNORMAL LOW (ref 13.0–17.0)
LYMPHS ABS: 3 10*3/uL (ref 0.7–4.0)
Lymphocytes Relative: 31 % (ref 12–46)
MCH: 22.4 pg — ABNORMAL LOW (ref 26.0–34.0)
MCHC: 32.9 g/dL (ref 30.0–36.0)
MCV: 68.1 fL — AB (ref 78.0–100.0)
Monocytes Absolute: 1 10*3/uL (ref 0.1–1.0)
Monocytes Relative: 10 % (ref 3–12)
Neutro Abs: 5.3 10*3/uL (ref 1.7–7.7)
Neutrophils Relative %: 55 % (ref 43–77)
Platelets: 306 10*3/uL (ref 150–400)
RBC: 5.58 MIL/uL (ref 4.22–5.81)
RDW: 14.7 % (ref 11.5–15.5)
WBC: 9.7 10*3/uL (ref 4.0–10.5)

## 2014-01-29 LAB — CBG MONITORING, ED: Glucose-Capillary: 403 mg/dL — ABNORMAL HIGH (ref 70–99)

## 2014-01-29 MED ORDER — HYDROMORPHONE HCL 1 MG/ML IJ SOLN: 1.0000 mg | Freq: Once | INTRAMUSCULAR | Status: DC

## 2014-01-29 MED ORDER — CEPHALEXIN 500 MG PO CAPS: 500.0000 mg | ORAL_CAPSULE | Freq: Four times a day (QID) | ORAL | Status: DC

## 2014-01-29 MED ORDER — HYDROCODONE-ACETAMINOPHEN 5-325 MG PO TABS: 2.0000 | ORAL_TABLET | ORAL | Status: DC | PRN

## 2014-01-29 MED ORDER — SODIUM CHLORIDE 0.9 % IV SOLN
Freq: Once | INTRAVENOUS | Status: AC
  Administered 2014-01-29: 1000 mL/h via INTRAVENOUS

## 2014-01-29 MED ORDER — SULFAMETHOXAZOLE-TRIMETHOPRIM 800-160 MG PO TABS: 1.0000 | ORAL_TABLET | Freq: Two times a day (BID) | ORAL | Status: AC

## 2014-01-29 MED ORDER — KETOROLAC TROMETHAMINE 30 MG/ML IJ SOLN
30.0000 mg | Freq: Once | INTRAMUSCULAR | Status: AC
  Administered 2014-01-29: 30 mg via INTRAVENOUS
  Filled 2014-01-29: qty 1

## 2014-01-29 MED ORDER — ONDANSETRON HCL 4 MG/2ML IJ SOLN
4.0000 mg | Freq: Once | INTRAMUSCULAR | Status: DC
  Filled 2014-01-29: qty 2

## 2014-01-29 MED ORDER — CEFTRIAXONE SODIUM 1 G IJ SOLR
1.0000 g | Freq: Once | INTRAMUSCULAR | Status: DC
Start: 2014-01-29 — End: 2014-01-29

## 2014-01-29 MED ORDER — CEFTRIAXONE SODIUM 1 G IJ SOLR
INTRAMUSCULAR | Status: AC
  Administered 2014-01-29: 1000 mg
  Filled 2014-01-29: qty 10

## 2014-01-29 NOTE — ED Provider Notes (Signed)
CSN: [id number]     Arrival date & time 01/29/14  1415 History   First MD Initiated Contact with Patient 01/29/14 1539     Chief Complaint  Patient presents with  . Abscess     (Consider location/radiation/quality/duration/timing/severity/associated sxs/prior Treatment) Patient is a 48 y.o. male presenting with abscess. The history is provided by the patient. No language interpreter was used.  Abscess Location:  Ano-genital Ano-genital abscess location:  L buttock Size:  5 Abscess quality: draining, redness and warmth   Progression:  Worsening Chronicity:  New Context: diabetes   Relieved by:  Nothing Worsened by:  Nothing tried Ineffective treatments:  None tried Associated symptoms: no fever     Past Medical History  Diagnosis Date  . Morbid obesity   . Allergic rhinitis, cause unspecified   . Unspecified sleep apnea   . Unspecified disorder resulting from impaired renal function   . Other primary cardiomyopathies   . Unspecified essential hypertension   . Insomnia, unspecified   . Anxiety state, unspecified   . Depressive disorder, not elsewhere classified   . Anemia in stage 3 chronic kidney disease   . Diabetes mellitus without complication    Past Surgical History  Procedure Laterality Date  . Appendectomy    . Patellar tendon repair      left   Family History  Problem Relation Age of Onset  . Heart disease     History  Substance Use Topics  . Smoking status: Never Smoker   . Smokeless tobacco: Never Used  . Alcohol Use: 0.6 oz/week    1 Cans of beer per week     Comment: socially    Review of Systems  Constitutional: Negative for fever.  All other systems reviewed and are negative.     Allergies  Review of patient's allergies indicates no known allergies.  Home Medications   Prior to Admission medications   Medication Sig Start Date End Date Taking? Authorizing Provider  carvedilol (COREG) 25 MG tablet Take 25 mg by mouth 2 (two) times  daily with a meal.    Yes Historical Provider, MD  cholecalciferol (VITAMIN D) 1000 UNITS tablet Take 3,000 Units by mouth daily.   Yes Historical Provider, MD  colchicine 0.6 MG tablet Take 0.6 mg by mouth as needed.    Yes Historical Provider, MD  esomeprazole (NEXIUM) 40 MG capsule Take 40 mg by mouth daily as needed.    Yes Historical Provider, MD  furosemide (LASIX) 40 MG tablet Take 40 mg by mouth 2 (two) times daily.   Yes Historical Provider, MD  hydrALAZINE (APRESOLINE) 25 MG tablet Take 25 mg by mouth 3 (three) times daily.   Yes Historical Provider, MD  ibuprofen (ADVIL,MOTRIN) 800 MG tablet Take 1 tablet (800 mg total) by mouth 3 (three) times daily. 02/05/13  Yes Kristen N Ward, DO  Liraglutide (VICTOZA) 18 MG/3ML SOPN Inject 1.2 Units into the skin daily.   Yes Historical Provider, MD  Magnesium 250 MG TABS Take 1 tablet by mouth daily.   Yes Historical Provider, MD  potassium chloride SA (K-DUR,KLOR-CON) 20 MEQ tablet Take 20 mEq by mouth 2 (two) times daily.     Yes Historical Provider, MD  spironolactone (ALDACTONE) 25 MG tablet Take 25 mg by mouth 2 (two) times daily.   Yes Historical Provider, MD  Testosterone (ANDROGEL) 20.25 MG/1.25GM (1.62%) GEL Place 1 application onto the skin daily.   Yes Historical Provider, MD  clonazePAM (KLONOPIN) 0.5 MG tablet Take 1 tablet (  0.5 mg total) by mouth at bedtime as needed. 12/17/13   Deneise Lever, MD  diltiazem (CARDIZEM CD) 360 MG 24 hr capsule Take 1 tablet by mouth Daily. 10/07/11   Historical Provider, MD  PARoxetine (PAXIL) 10 MG tablet Take 20 mg by mouth as needed.     Historical Provider, MD   BP 175/100  Pulse 93  Temp(Src) 98.9 F (37.2 C) (Oral)  Resp 18  Ht 5\' 11"  (1.803 m)  Wt 370 lb (167.831 kg)  BMI 51.63 kg/m2  SpO2 98% Physical Exam  Nursing note and vitals reviewed. Constitutional: He is oriented to person, place, and time. He appears well-developed and well-nourished.  HENT:  Head: Normocephalic.  Eyes: EOM  are normal. Pupils are equal, round, and reactive to light.  Neck: Normal range of motion.  Cardiovascular: Normal rate.   Pulmonary/Chest: Effort normal.  Abdominal: He exhibits no distension.  Musculoskeletal:  Left buttock open draining red area,  5cm open area,  Surrounding erythema,   Neurological: He is alert and oriented to person, place, and time.  Skin: Skin is warm.  Psychiatric: He has a normal mood and affect.    ED Course  Procedures (including critical care time) Labs Review Labs Reviewed  CBG MONITORING, ED - Abnormal; Notable for the following:    Glucose-Capillary 403 (*)    All other components within normal limits    Imaging Review No results found.   EKG Interpretation None      MDM   Final diagnoses:  Skin infection    Pt given Iv rocephin, Bactrim and keflex,   Pt advised to follow up recheck in 2 days,  Primary care or here    Fransico Meadow, PA-C 01/29/14 [place], PA-C 01/29/14 2213

## 2014-01-29 NOTE — ED Notes (Signed)
Pt reports he's had a boil on left buttock "for a while"- burst on Sunday and has been draining- concerned for infection

## 2014-01-29 NOTE — Discharge Instructions (Signed)

## 2014-01-30 NOTE — ED Provider Notes (Signed)
Medical screening examination/treatment/procedure(s) were performed by non-physician practitioner and as supervising physician I was immediately available for consultation/collaboration.   EKG Interpretation None        Malvin Johns, MD 01/30/14 [phone]

## 2014-03-04 ENCOUNTER — Telehealth: Payer: Self-pay | Admitting: Internal Medicine

## 2014-03-04 NOTE — Telephone Encounter (Signed)
Pt scheduled to see Nicoletta Ba PA 03/10/14@1 :30pmRomelle Starcher medical to notify pt of appt and fax records.

## 2014-03-06 ENCOUNTER — Encounter: Payer: Self-pay | Admitting: *Deleted

## 2014-03-10 ENCOUNTER — Encounter: Payer: Self-pay | Admitting: Physician Assistant

## 2014-03-10 ENCOUNTER — Telehealth: Payer: Self-pay | Admitting: *Deleted

## 2014-03-10 ENCOUNTER — Ambulatory Visit (INDEPENDENT_AMBULATORY_CARE_PROVIDER_SITE_OTHER): Payer: Medicaid Other | Admitting: Physician Assistant

## 2014-03-10 VITALS — BP 154/98 | HR 80 | Ht 71.0 in | Wt 370.8 lb

## 2014-03-10 DIAGNOSIS — D509 Iron deficiency anemia, unspecified: Secondary | ICD-10-CM

## 2014-03-10 MED ORDER — MOVIPREP 100 G PO SOLR: 1.0000 | Freq: Once | ORAL | Status: DC

## 2014-03-10 NOTE — Telephone Encounter (Signed)
Error

## 2014-03-10 NOTE — Patient Instructions (Signed)
You have been scheduled for an endoscopy and colonoscopy. Please follow the written instructions given to you at your visit today. Please pick up your prep at the pharmacy within the next 1-3 days. If you use inhalers (even only as needed), please bring them with you on the day of your procedure. Your physician has requested that you go to www.startemmi.com and enter the access code given to you at your visit today. This web site gives a general overview about your procedure. However, you should still follow specific instructions given to you by our office regarding your preparation for the procedure.  CC: Dr Glendon Axe

## 2014-03-10 NOTE — Progress Notes (Signed)
Agree with assessment. Is this iron deficiency? Normal ferritin. Same Hg and MCV over the past 8 years... ? hemaglobinopathy . Stool Hemoccult status known? Thanks

## 2014-03-10 NOTE — Telephone Encounter (Signed)
OK to refill KlorCON

## 2014-03-10 NOTE — Telephone Encounter (Signed)
Patient requests refills of klor con. Do you want me to continue to fill?

## 2014-03-10 NOTE — Progress Notes (Signed)
Subjective:    Patient ID: Dustin Mcclain, male    DOB: 1966/04/14, 48 y.o.   MRN: [id number]  HPI  Dustin Mcclain is a pleasant 48 year old African-American male known remotely to Dr. Henrene Pastor from prior colonoscopy. This was done for complaints of rectal bleeding and abdominal pain in September 2004. This was a normal colonoscopy with grade 1 internal hemorrhoids. Patient is referred now by [facility] / Clayborne Dana for evaluation of iron deficiency anemia. Patient says he was diagnosed with anemia once before. He has no specific complaints at this time has not noticed any changes in his bowel habits, no abdominal pain or discomfort no melena or hematochezia. His appetite has been fine and his weight has been stable. Other medical problems include morbid obesity, adult-onset diabetes mellitus, depression, hypertension, cardiomyopathy which has been stable, chronic kidney disease stage III and sleep apnea. Family history is negative for colon cancer. Patient has been taking a tonic solution for iron replacement. Review of recent labs shows 02/17/2014 folate normal at 20.9 ferritin 198 serum iron low at 38 TIBC 360 08/13/1931 and most recent hemoglobin 12.9 hematocrit of 40.6 and MCV of 69 BUN 22 creatinine 1.6 LFTs were normal    Review of Systems  Constitutional: Negative.   HENT: Negative.   Eyes: Negative.   Respiratory: Negative.   Cardiovascular: Negative.   Gastrointestinal: Negative.   Endocrine: Negative.   Genitourinary: Negative.   Musculoskeletal: Positive for arthralgias.  Skin: Negative.   Allergic/Immunologic: Negative.   Neurological: Negative.   Hematological: Negative.   Psychiatric/Behavioral: Negative.    Outpatient Prescriptions Prior to Visit  Medication Sig Dispense Refill  . carvedilol (COREG) 25 MG tablet Take 25 mg by mouth 2 (two) times daily with a meal.     . cephALEXin (KEFLEX) 500 MG capsule Take 1 capsule (500 mg total) by mouth 4 (four) times daily. 40  capsule 0  . cholecalciferol (VITAMIN D) 1000 UNITS tablet Take 3,000 Units by mouth daily.    . clonazePAM (KLONOPIN) 0.5 MG tablet Take 1 tablet (0.5 mg total) by mouth at bedtime as needed. 30 tablet 5  . colchicine 0.6 MG tablet Take 0.6 mg by mouth as needed.     . diltiazem (CARDIZEM CD) 360 MG 24 hr capsule Take 1 tablet by mouth Daily.    Marland Kitchen esomeprazole (NEXIUM) 40 MG capsule Take 40 mg by mouth daily as needed.     . furosemide (LASIX) 40 MG tablet Take 40 mg by mouth 2 (two) times daily.    . hydrALAZINE (APRESOLINE) 25 MG tablet Take 25 mg by mouth 3 (three) times daily.    Marland Kitchen HYDROcodone-acetaminophen (NORCO/VICODIN) 5-325 MG per tablet Take 2 tablets by mouth every 4 (four) hours as needed. 20 tablet 0  . Magnesium 250 MG TABS Take 1 tablet by mouth daily.    Marland Kitchen PARoxetine (PAXIL) 10 MG tablet Take 20 mg by mouth as needed.     . potassium chloride SA (K-DUR,KLOR-CON) 20 MEQ tablet Take 20 mEq by mouth 2 (two) times daily.      Marland Kitchen spironolactone (ALDACTONE) 25 MG tablet Take 25 mg by mouth 2 (two) times daily.    . Testosterone (ANDROGEL) 20.25 MG/1.25GM (1.62%) GEL Place 1 application onto the skin daily.    Marland Kitchen ibuprofen (ADVIL,MOTRIN) 800 MG tablet Take 1 tablet (800 mg total) by mouth 3 (three) times daily. 21 tablet 0  . Liraglutide (VICTOZA) 18 MG/3ML SOPN Inject 1.2 Units into the skin daily.  No facility-administered medications prior to visit.   No Known Allergies Patient Active Problem List   Diagnosis Date Noted  . Right knee pain 08/06/2013  . Joint effusion 08/06/2013  . DM 10/23/2009  . OBESITY, MORBID 07/27/2008  . CHF 07/27/2008  . Seasonal allergic rhinitis 07/27/2008  . CARDIOMYOPATHY 07/11/2008  . RENAL INSUFFICIENCY 07/11/2008  . ANXIETY 06/06/2008  . DEPRESSION 06/06/2008  . HYPERTENSION 06/06/2008  . INSOMNIA 06/06/2008  . Obstructive sleep apnea 06/06/2008   History  Substance Use Topics  . Smoking status: Never Smoker   . Smokeless tobacco:  Never Used  . Alcohol Use: 0.6 oz/week    1 Cans of beer per week     Comment: socially   family history includes Diabetes in his father and mother; Heart disease in his father and mother.     Objective:   Physical Exam  Well-developed morbidly obese African-American male in no acute distress, pleasant,blood pressure 154/98 pulse 80 height 5 foot 11 weight 372 BMI 51.7. HEENT; ;nontraumatic,normocephalic EOMI PERRLA sclera anicteric, Cardiovascular regular rate and rhythm with S1-S2 no murmur rub or gallop, Pulmonary ;somewhat distant breath sounds, Abdomen; morbidly obese soft nontender no palpable mass or hepatosplenomegaly, Rectal; exam not done, Extremities ;no clubbing cyanosis or edema skin warm dry, Psych ;mood and affect appropriate        Assessment & Plan:  #61  48 year old African-American male with iron deficiency anemia-rule out occult neoplasm, lower versus upper source., Consider intestinal angioectasia #2 morbid obesity BMI 51 #3 history of congestive heart failure stable #4 history of cardiomyopathy #5 sleep apnea #6 hypertension #7 chronic kidney disease stage III  Plan; Patient can continue current tonic for liquid iron replacement- he will need repeat iron studies in 3 months and if not improving will need prescription iron  replacement Will schedule for colonoscopy and EGD with Dr. Henrene Pastor. Procedures will need to be scheduled at the hospital due to BMI. Procedures discussed in detail with the patient and he is agreeable to proceed.

## 2014-03-11 NOTE — Progress Notes (Signed)
I agree with your assessment. However, not sure colonoscopy would be indicated either until age 48. Thanks

## 2014-03-11 NOTE — Progress Notes (Signed)
Please call pt and let him know we reviewed his labs and labs from previous years and his blood counts are really stable - about the same hgb as 8 years  Ago.  Anemia is  mild- would like  Him to do hemocult  cards, and a hgb electrophoresis to look for other reasons for chronic mild anemia-  He should still have colonoscopy as it has been greater than 10 years, but not sure needs EGD.. If his stool is negative for blood will cancel EGD... Thanks

## 2014-03-12 ENCOUNTER — Telehealth: Payer: Self-pay

## 2014-03-12 ENCOUNTER — Other Ambulatory Visit: Payer: Medicaid Other

## 2014-03-12 ENCOUNTER — Other Ambulatory Visit: Payer: Self-pay

## 2014-03-12 DIAGNOSIS — D500 Iron deficiency anemia secondary to blood loss (chronic): Secondary | ICD-10-CM

## 2014-03-12 NOTE — Telephone Encounter (Signed)
-----   Message from Alfredia Ferguson, PA-C sent at 03/11/2014  3:42 PM EST -----   ----- Message -----    From: Irene Shipper, MD    Sent: 03/10/2014   7:53 PM      To: Alfredia Ferguson, PA-C    ----- Message -----    From: Alfredia Ferguson, PA-C    Sent: 03/10/2014   5:42 PM      To: Irene Shipper, MD

## 2014-03-12 NOTE — Telephone Encounter (Signed)
Patient advised of need to do hemoccult cards and have further labs. He agrees to come by soon for these tests.

## 2014-03-14 ENCOUNTER — Encounter: Payer: Self-pay | Admitting: Internal Medicine

## 2014-03-14 LAB — HEMOGLOBINOPATHY EVALUATION
HGB S QUANTITAION: 0 %
Hemoglobin Other: 0 %
Hgb A2 Quant: 2.7 % (ref 2.2–3.2)
Hgb A: 97.3 % (ref 96.8–97.8)
Hgb F Quant: 0 % (ref 0.0–2.0)

## 2014-03-28 ENCOUNTER — Encounter (HOSPITAL_COMMUNITY): Payer: Self-pay | Admitting: *Deleted

## 2014-04-14 ENCOUNTER — Other Ambulatory Visit (INDEPENDENT_AMBULATORY_CARE_PROVIDER_SITE_OTHER): Payer: Medicaid Other

## 2014-04-14 DIAGNOSIS — D500 Iron deficiency anemia secondary to blood loss (chronic): Secondary | ICD-10-CM | POA: Diagnosis not present

## 2014-04-14 LAB — HEMOCCULT SLIDES (X 3 CARDS)
FECAL OCCULT BLD: NEGATIVE
OCCULT 1: NEGATIVE
OCCULT 2: NEGATIVE
OCCULT 3: NEGATIVE
OCCULT 4: NEGATIVE
OCCULT 5: NEGATIVE

## 2014-04-15 ENCOUNTER — Encounter (HOSPITAL_COMMUNITY)
Admission: RE | Disposition: A | Discharge status: Home / Self Care | Payer: Self-pay | Source: Ambulatory Visit | Attending: Internal Medicine

## 2014-04-15 ENCOUNTER — Ambulatory Visit (HOSPITAL_COMMUNITY)
Admission: RE | Admit: 2014-04-15 | Discharge: 2014-04-15 | Disposition: A | Discharge status: Home / Self Care | Payer: Medicaid Other | Source: Ambulatory Visit | Attending: Internal Medicine | Admitting: Internal Medicine

## 2014-04-15 ENCOUNTER — Ambulatory Visit (HOSPITAL_COMMUNITY): Payer: Medicaid Other | Admitting: *Deleted

## 2014-04-15 ENCOUNTER — Encounter (HOSPITAL_COMMUNITY): Payer: Self-pay

## 2014-04-15 DIAGNOSIS — I509 Heart failure, unspecified: Secondary | ICD-10-CM | POA: Diagnosis not present

## 2014-04-15 DIAGNOSIS — Z6841 Body Mass Index (BMI) 40.0 and over, adult: Secondary | ICD-10-CM | POA: Insufficient documentation

## 2014-04-15 DIAGNOSIS — Z1211 Encounter for screening for malignant neoplasm of colon: Secondary | ICD-10-CM | POA: Insufficient documentation

## 2014-04-15 DIAGNOSIS — F418 Other specified anxiety disorders: Secondary | ICD-10-CM | POA: Diagnosis not present

## 2014-04-15 DIAGNOSIS — K219 Gastro-esophageal reflux disease without esophagitis: Secondary | ICD-10-CM | POA: Diagnosis not present

## 2014-04-15 DIAGNOSIS — D124 Benign neoplasm of descending colon: Secondary | ICD-10-CM

## 2014-04-15 DIAGNOSIS — J45909 Unspecified asthma, uncomplicated: Secondary | ICD-10-CM | POA: Insufficient documentation

## 2014-04-15 DIAGNOSIS — D631 Anemia in chronic kidney disease: Secondary | ICD-10-CM | POA: Diagnosis not present

## 2014-04-15 DIAGNOSIS — I429 Cardiomyopathy, unspecified: Secondary | ICD-10-CM | POA: Insufficient documentation

## 2014-04-15 DIAGNOSIS — K648 Other hemorrhoids: Secondary | ICD-10-CM | POA: Diagnosis not present

## 2014-04-15 DIAGNOSIS — I129 Hypertensive chronic kidney disease with stage 1 through stage 4 chronic kidney disease, or unspecified chronic kidney disease: Secondary | ICD-10-CM | POA: Insufficient documentation

## 2014-04-15 DIAGNOSIS — N183 Chronic kidney disease, stage 3 (moderate): Secondary | ICD-10-CM | POA: Insufficient documentation

## 2014-04-15 DIAGNOSIS — E119 Type 2 diabetes mellitus without complications: Secondary | ICD-10-CM | POA: Insufficient documentation

## 2014-04-15 DIAGNOSIS — Z794 Long term (current) use of insulin: Secondary | ICD-10-CM | POA: Diagnosis not present

## 2014-04-15 DIAGNOSIS — K5730 Diverticulosis of large intestine without perforation or abscess without bleeding: Secondary | ICD-10-CM | POA: Diagnosis not present

## 2014-04-15 DIAGNOSIS — D509 Iron deficiency anemia, unspecified: Secondary | ICD-10-CM | POA: Insufficient documentation

## 2014-04-15 HISTORY — DX: Unspecified asthma, uncomplicated: J45.909

## 2014-04-15 HISTORY — DX: Gastro-esophageal reflux disease without esophagitis: K21.9

## 2014-04-15 HISTORY — PX: COLONOSCOPY WITH PROPOFOL: SHX5780

## 2014-04-15 HISTORY — DX: Enthesopathy, unspecified: M77.9

## 2014-04-15 HISTORY — DX: Benign neoplasm of descending colon: D12.4

## 2014-04-15 HISTORY — DX: Heart failure, unspecified: I50.9

## 2014-04-15 LAB — GLUCOSE, CAPILLARY: Glucose-Capillary: 162 mg/dL — ABNORMAL HIGH (ref 70–99)

## 2014-04-15 SURGERY — COLONOSCOPY WITH PROPOFOL
Anesthesia: Monitor Anesthesia Care

## 2014-04-15 MED ORDER — PROPOFOL 10 MG/ML IV BOLUS
INTRAVENOUS | Status: AC
  Filled 2014-04-15: qty 20

## 2014-04-15 MED ORDER — PROPOFOL 10 MG/ML IV BOLUS
INTRAVENOUS | Status: AC
Start: 2014-04-15 — End: 2014-04-15
  Filled 2014-04-15: qty 20

## 2014-04-15 MED ORDER — PROPOFOL INFUSION 10 MG/ML OPTIME
INTRAVENOUS | Status: DC | PRN
  Administered 2014-04-15: 250 ug/kg/min via INTRAVENOUS

## 2014-04-15 MED ORDER — GLYCOPYRROLATE 0.2 MG/ML IJ SOLN
INTRAMUSCULAR | Status: DC | PRN
  Administered 2014-04-15 (×2): 0.1 mg via INTRAVENOUS

## 2014-04-15 MED ORDER — LIDOCAINE HCL (CARDIAC) 20 MG/ML IV SOLN
INTRAVENOUS | Status: AC
  Filled 2014-04-15: qty 5

## 2014-04-15 MED ORDER — LACTATED RINGERS IV SOLN
INTRAVENOUS | Status: DC | PRN
  Administered 2014-04-15: 10:00:00 via INTRAVENOUS

## 2014-04-15 MED ORDER — PROPOFOL 10 MG/ML IV BOLUS
INTRAVENOUS | Status: DC | PRN
  Administered 2014-04-15: 20 mg via INTRAVENOUS
  Administered 2014-04-15: 30 mg via INTRAVENOUS
  Administered 2014-04-15: 50 mg via INTRAVENOUS

## 2014-04-15 SURGICAL SUPPLY — 24 items

## 2014-04-15 NOTE — Anesthesia Postprocedure Evaluation (Signed)
Anesthesia Post Note  Patient: Dustin Mcclain  Procedure(s) Performed: Procedure(s) (LRB): COLONOSCOPY WITH PROPOFOL (N/A)  Anesthesia type: MAC  Patient location: PACU  Post pain: Pain level controlled  Post assessment: Post-op Vital signs reviewed  Last Vitals: BP 145/77 mmHg  Pulse 66  Temp(Src) 36.4 C (Oral)  Resp 17  Ht 5\' 11"  (1.803 m)  Wt 370 lb (167.831 kg)  BMI 51.63 kg/m2  SpO2 100%  Post vital signs: Reviewed  Level of consciousness: awake  Complications: No apparent anesthesia complications

## 2014-04-15 NOTE — Op Note (Signed)
[facility] [place], [address]   COLONOSCOPY PROCEDURE REPORT  PATIENT: Erikson, Danzy  MR#: [id number] BIRTHDATE: March 07, 1966 , 48  yrs. old GENDER: male ENDOSCOPIST: Eustace Quail, MD REFERRED KG:URKYH Smith, M.D. PROCEDURE DATE:  04/15/2014 PROCEDURE:   Colonoscopy with snare polypectomy x 1 First Screening Colonoscopy - Avg.  risk and is 50 yrs.  old or older - No.  Prior Negative Screening - Now for repeat screening. N/A  History of Adenoma - Now for follow-up colonoscopy & has been > or = to 3 yrs.  N/A  Polyps Removed Today? Yes. ASA CLASS:   Class II INDICATIONS:average risk for colorectal cancer. MEDICATIONS: Monitored anesthesia care and Per Anesthesia  DESCRIPTION OF PROCEDURE:   After the risks benefits and alternatives of the procedure were thoroughly explained, informed consent was obtained.  The digital rectal exam revealed no abnormalities of the rectum.   The Pentax Ped Colon [id number] endoscope was introduced through the anus and advanced to the cecum, which was identified by both the appendix and ileocecal valve. No adverse events experienced.   The quality of the prep was good, using MoviPrep  The instrument was then slowly withdrawn as the colon was fully examined.    COLON FINDINGS: A single polyp measuring 5 mm in size was found in the descending colon.  A polypectomy was performed with a cold snare.  The resection was complete, the polyp tissue was completely retrieved and sent to histology.   There was mild diverticulosis noted in the left colon.   The examination was otherwise normal. Retroflexed views revealed internal hemorrhoids. The time to cecum=5 minutes 0 seconds.  Withdrawal time=11 minutes 0 seconds. The scope was withdrawn and the procedure completed. COMPLICATIONS: There were no immediate complications.  ENDOSCOPIC IMPRESSION: 1.   Single polyp measuring 5 mm in size was found in the descending colon;  polypectomy was performed with a cold snare 2.   Mild diverticulosis was noted in the left colon 3.   The examination was otherwise normal  RECOMMENDATIONS: 1. Repeat colonoscopy in 5 years if polyp adenomatous; otherwise 10 years (Dr. Henrene Pastor will send you a letter with the results)  eSigned:  Eustace Quail, MD 2014-04-15 [phone]   cc: Clayborne Dana MD and The Patient

## 2014-04-15 NOTE — Transfer of Care (Signed)
Immediate Anesthesia Transfer of Care Note  Patient: Dustin Mcclain  Procedure(s) Performed: Procedure(s): COLONOSCOPY WITH PROPOFOL (N/A)  Patient Location: PACU  Anesthesia Type:MAC  Level of Consciousness: Patient easily awoken, sedated, comfortable, cooperative, following commands, responds to stimulation.   Airway & Oxygen Therapy: Patient spontaneously breathing, ventilating well, oxygen via simple oxygen mask.  Post-op Assessment: Report given to PACU RN, vital signs reviewed and stable, moving all extremities.   Post vital signs: Reviewed and stable.  Complications: No apparent anesthesia complications

## 2014-04-15 NOTE — Anesthesia Preprocedure Evaluation (Addendum)
Anesthesia Evaluation  Patient identified by MRN, date of birth, ID band Patient awake    Reviewed: Allergy & Precautions, H&P , NPO status , Patient's Chart, lab work & pertinent test results, reviewed documented beta blocker date and time   Airway Mallampati: II  TM Distance: >3 FB Neck ROM: Full    Dental no notable dental hx.    Pulmonary asthma , sleep apnea ,  breath sounds clear to auscultation  Pulmonary exam normal       Cardiovascular hypertension, Pt. on medications and Pt. on home beta blockers +CHF Rhythm:Regular Rate:Normal     Neuro/Psych PSYCHIATRIC DISORDERS Anxiety Depression negative neurological ROS     GI/Hepatic Neg liver ROS, GERD-  ,  Endo/Other  diabetes, Type 2, Oral Hypoglycemic Agents, Insulin DependentMorbid obesity  Renal/GU CRF and Renal InsufficiencyRenal disease     Musculoskeletal negative musculoskeletal ROS (+)   Abdominal (+) + obese,   Peds  Hematology  (+) anemia ,   Anesthesia Other Findings   Reproductive/Obstetrics                            Anesthesia Physical Anesthesia Plan  ASA: III  Anesthesia Plan: MAC   Post-op Pain Management:    Induction: Intravenous  Airway Management Planned:   Additional Equipment:   Intra-op Plan:   Post-operative Plan:   Informed Consent: I have reviewed the patients History and Physical, chart, labs and discussed the procedure including the risks, benefits and alternatives for the proposed anesthesia with the patient or authorized representative who has indicated his/her understanding and acceptance.   Dental advisory given  Plan Discussed with: CRNA  Anesthesia Plan Comments:         Anesthesia Quick Evaluation

## 2014-04-15 NOTE — H&P (Signed)
  HISTORY OF PRESENT ILLNESS:  Dustin Mcclain is a 48 y.o. male sent to our office 03/10/2014 regarding mild, chronic, stable normocytic anemia (hemoglobin 12.9, MCV 69) with equivocal iron studies. Normal ferritin 198. Had a colonoscopy in 2004 which was unrevealing. GI review of systems entirely negative. Multiple Hemoccult studies negative. Blood counts essentially unchanged over 8 years. I had initially been set up for colonoscopy and upper endoscopy. Plan today for colonoscopy only for colon cancer screening and a 48 year old African-American with previous colonoscopy greater than 10 years ago  REVIEW OF SYSTEMS:  All non-GI ROS negative except for  Past Medical History  Diagnosis Date  . Morbid obesity   . Allergic rhinitis, cause unspecified   . Other primary cardiomyopathies   . Unspecified essential hypertension   . Insomnia, unspecified   . Anxiety state, unspecified   . Depressive disorder, not elsewhere classified   . Diabetes mellitus without complication   . CHF (congestive heart failure)     right side  . Unspecified sleep apnea     cpap setting of 22 or 23  . Asthma     mild  . Unspecified disorder resulting from impaired renal function   . Anemia in stage 3 chronic kidney disease     sees dr Arty Baumgartner every 3 months  . GERD (gastroesophageal reflux disease)   . Tendinitis     left wrist and elbow and left knee    Past Surgical History  Procedure Laterality Date  . Appendectomy    . Patellar tendon repair      left    Social History THARON BOMAR  reports that he has never smoked. He has never used smokeless tobacco. He reports that he drinks about 0.6 oz of alcohol per week. He reports that he does not use illicit drugs.  family history includes Diabetes in his father and mother; Heart disease in his father and mother.  No Known Allergies     PHYSICAL EXAMINATION: Vital signs: BP 171/115 mmHg  Pulse 74  Temp(Src) 97.6 F (36.4 C) (Oral)  Resp  17  Ht 5\' 11"  (1.803 m)  Wt 370 lb (167.831 kg)  BMI 51.63 kg/m2  SpO2 100%  Constitutional: Pleasant, markedly obese, generally well-appearing, no acute distress Psychiatric: alert and oriented x3, cooperative Eyes: extraocular movements intact, anicteric, conjunctiva pink Mouth: oral pharynx moist, no lesions Neck: supple no lymphadenopathy Cardiovascular: heart regular rate and rhythm, no murmur Lungs: clear to auscultation bilaterally Abdomen: soft, obese, nontender, nondistended, no obvious ascites, no peritoneal signs, normal bowel sounds, no organomegaly Rectal: Deferred until colonoscopy Extremities: no lower extremity edema bilaterally Skin: no lesions on visible extremities Neuro: No focal deficits. No asterixis.    ASSESSMENT:  #1. Colon cancer screening. Baseline risk. Appropriate candidate without contraindication #2. Chronic mild microcytic anemia. Not clearly iron deficient. Negative Hemoccult studies.   PLAN:  #1. Colonoscopy.The nature of the procedure, as well as the risks, benefits, and alternatives were carefully and thoroughly reviewed with the patient. Ample time for discussion and questions allowed. The patient understood, was satisfied, and agreed to proceed. #2. PCP should monitor blood counts. If anemia were to progress, recommend hematology opinion. Discussed with patient.

## 2014-04-16 ENCOUNTER — Encounter (HOSPITAL_COMMUNITY): Payer: Self-pay | Admitting: Internal Medicine

## 2014-04-17 ENCOUNTER — Encounter: Payer: Self-pay | Admitting: Internal Medicine

## 2014-04-27 NOTE — Assessment & Plan Note (Signed)
Good compliance and control with less daytime sleepiness and no snoring. We can continue present settings. Clonazepam refilled

## 2014-04-27 NOTE — Assessment & Plan Note (Signed)
His sleep quality is better and more regular now, using CPAP. He still has night sweats with some insomnia, mostly difficulty initiating sleep. Plan-refill clonazepam

## 2014-06-25 ENCOUNTER — Telehealth: Payer: Self-pay | Admitting: Internal Medicine

## 2014-06-25 DIAGNOSIS — G4733 Obstructive sleep apnea (adult) (pediatric): Secondary | ICD-10-CM

## 2014-06-25 NOTE — Telephone Encounter (Signed)
Spoke with pt. Needs new order for CPAP supplies (mask, filters, hoses and head gear). Order will be placed.

## 2014-12-18 ENCOUNTER — Ambulatory Visit (INDEPENDENT_AMBULATORY_CARE_PROVIDER_SITE_OTHER): Payer: Medicaid Other | Admitting: Internal Medicine

## 2014-12-18 ENCOUNTER — Encounter: Payer: Self-pay | Admitting: Internal Medicine

## 2014-12-18 VITALS — BP 138/86 | HR 80 | Ht 71.0 in | Wt 378.0 lb

## 2014-12-18 DIAGNOSIS — G4733 Obstructive sleep apnea (adult) (pediatric): Secondary | ICD-10-CM | POA: Diagnosis not present

## 2014-12-18 NOTE — Patient Instructions (Addendum)
Order- DME Advanced  Download  For pressure compliance   Install AirView if possible  Order- referral to [facility] Bariatric program for dx morbid obesity -Pt was given number for [place] to speak with enroller for Bariatric program [phone]).

## 2014-12-18 NOTE — Progress Notes (Signed)
Subjective:    Patient ID: Dustin Mcclain, male    DOB: 1965/07/02, 49 y.o.   MRN: [id number]  HPI 11/25/10-  72 yoM never smoker followed for OSA/ insomnia, complicated by obesity, HBP, cardiomyopathy, renal insufficiency, DM. Last here - October 23, 2009- note reviewed CPAP only being used a couple of times/ week around when he works out. He can now walk as much as he wants, having improved his endurance. He is really pleased with how much better he is feeling.  Says Cardiomyopathy has improved. - [facility] Allergic rhinitis- little problem this spring.   11/28/11-  89 yoM never smoker followed for OSA/ insomnia, complicated by obesity, HBP, cardiomyopathy, renal insufficiency, DM.  Pt states he is wearing CPAP nightly approx 6-7 hrs. Pt denies problems with mask or pressure.   He is pleased now with CPAP/ Advanced and describes good compliance and control. We need to verify his pressure setting with his DME company  12/17/12- 46 yoM never smoker followed for OSA/ insomnia, complicated by obesity, HBP, cardiomyopathy, renal insufficiency, DM. FOLLOWS GEX:BMWUX CPAP 14/ Advanced every night for at least 6 hours; pressure working well for patient; needs new supplies through [facility]. Compliance and comfort with CPAP per good at this pressure. Using a fullface mask with humidifier. Sleeping well without medication.  12/17/13- 33 yoM never smoker followed for OSA/ insomnia, complicated by obesity, HBP, cardiomyopathy, renal insufficiency, DM. FOLLOWS FOR: Wears CPAP14/ Advanced every night for about 5.5 to 7.5 hours; pressure works well for patient; DME is [facility]. No snoring, feels better with CPAP. Clonazepam does help occasionally for insomnia.  12/18/14- 37 yoM never smoker followed for OSA/ insomnia, complicated by obesity, HBP, cardiomyopathy, renal insufficiency, DM. Follows For: Uses CPAP 14/ Advanced an average of 5 hours nightly, mask fits well. Pt states breathing is doing well with no new compliants.   Doing well with CPAP 14/Advanced and won't sleep without it. We discussed weight.  ROS-see HPI Constitutional:   No-   weight loss, night sweats, fevers, chills, fatigue, lassitude. HEENT:   No-  headaches, difficulty swallowing, tooth/dental problems, sore throat,       No-  sneezing, itching, ear ache, nasal congestion, post nasal drip,  CV:  No-   chest pain, orthopnea, PND, swelling in lower extremities, anasarca,  dizziness, palpitations Resp: No-   shortness of breath with exertion or at rest.              No-   productive cough,  No non-productive cough,  No- coughing up of blood.              No-   change in color of mucus.  No- wheezing.   Skin: No-   rash or lesions. GI:  No-   heartburn, indigestion, abdominal pain, nausea, vomiting,  GU: . MS:  No-   joint pain or swelling.  Neuro-     nothing unusual Psych:  No- change in mood or affect. No depression or anxiety.  No memory loss.  Objective:   Physical Exam General- Alert, Oriented, Affect-appropriate, Distress- none acute   +Very obese Skin- rash-none, lesions- none, excoriation- none Lymphadenopathy- none Head- atraumatic            Eyes- Gross vision intact, PERRLA, conjunctivae clear secretions            Ears- Hearing, canals normal            Nose- Clear, no- Septal dev, mucus, polyps, erosion, perforation  Throat- Mallampati III-IV , mucosa clear , drainage- none, tonsils- atrophic Neck- flexible , trachea midline, no stridor , thyroid nl, carotid no bruit Chest - symmetrical excursion , unlabored           Heart/CV- RRR , no murmur , no gallop  , no rub, nl s1 s2                           - JVD- none , edema- none, stasis changes- none, varices- none           Lung- clear to P&A, wheeze- none, cough- none , dullness-none, rub- none           Chest wall-  Abd-  Br/ Gen/ Rectal- Not done, not indicated Extrem- cyanosis- none, clubbing, none, atrophy- none, strength- nl Neuro- grossly intact to  observatio  Assessment & Plan:

## 2014-12-21 NOTE — Assessment & Plan Note (Signed)
He describes good compliance and control. Current pressure works well and he is comfortable.

## 2014-12-21 NOTE — Assessment & Plan Note (Addendum)
He is dangerously overweight. I discussed how this impacts his other medical problems including obstructive sleep apnea. He is receptive to referral to bariatric program for help. Plan-bariatric referral

## 2015-07-21 ENCOUNTER — Emergency Department (HOSPITAL_BASED_OUTPATIENT_CLINIC_OR_DEPARTMENT_OTHER)
Admission: EM | Admit: 2015-07-21 | Discharge: 2015-07-22 | Disposition: A | Discharge status: Home / Self Care | Payer: Medicaid Other | Attending: Emergency Medicine | Admitting: Emergency Medicine

## 2015-07-21 ENCOUNTER — Encounter (HOSPITAL_BASED_OUTPATIENT_CLINIC_OR_DEPARTMENT_OTHER): Payer: Self-pay | Admitting: *Deleted

## 2015-07-21 DIAGNOSIS — L03115 Cellulitis of right lower limb: Secondary | ICD-10-CM | POA: Insufficient documentation

## 2015-07-21 DIAGNOSIS — D631 Anemia in chronic kidney disease: Secondary | ICD-10-CM | POA: Diagnosis not present

## 2015-07-21 DIAGNOSIS — F329 Major depressive disorder, single episode, unspecified: Secondary | ICD-10-CM | POA: Diagnosis not present

## 2015-07-21 DIAGNOSIS — I509 Heart failure, unspecified: Secondary | ICD-10-CM | POA: Insufficient documentation

## 2015-07-21 DIAGNOSIS — G4700 Insomnia, unspecified: Secondary | ICD-10-CM | POA: Insufficient documentation

## 2015-07-21 DIAGNOSIS — L02419 Cutaneous abscess of limb, unspecified: Secondary | ICD-10-CM

## 2015-07-21 DIAGNOSIS — L02415 Cutaneous abscess of right lower limb: Secondary | ICD-10-CM | POA: Insufficient documentation

## 2015-07-21 DIAGNOSIS — Z79899 Other long term (current) drug therapy: Secondary | ICD-10-CM | POA: Insufficient documentation

## 2015-07-21 DIAGNOSIS — J45909 Unspecified asthma, uncomplicated: Secondary | ICD-10-CM | POA: Diagnosis not present

## 2015-07-21 DIAGNOSIS — K219 Gastro-esophageal reflux disease without esophagitis: Secondary | ICD-10-CM | POA: Insufficient documentation

## 2015-07-21 DIAGNOSIS — I129 Hypertensive chronic kidney disease with stage 1 through stage 4 chronic kidney disease, or unspecified chronic kidney disease: Secondary | ICD-10-CM | POA: Insufficient documentation

## 2015-07-21 DIAGNOSIS — F419 Anxiety disorder, unspecified: Secondary | ICD-10-CM | POA: Insufficient documentation

## 2015-07-21 DIAGNOSIS — G4730 Sleep apnea, unspecified: Secondary | ICD-10-CM | POA: Diagnosis not present

## 2015-07-21 DIAGNOSIS — N183 Chronic kidney disease, stage 3 (moderate): Secondary | ICD-10-CM | POA: Diagnosis not present

## 2015-07-21 DIAGNOSIS — E119 Type 2 diabetes mellitus without complications: Secondary | ICD-10-CM | POA: Insufficient documentation

## 2015-07-21 DIAGNOSIS — Z794 Long term (current) use of insulin: Secondary | ICD-10-CM | POA: Diagnosis not present

## 2015-07-21 DIAGNOSIS — L03119 Cellulitis of unspecified part of limb: Secondary | ICD-10-CM

## 2015-07-21 DIAGNOSIS — Z9981 Dependence on supplemental oxygen: Secondary | ICD-10-CM | POA: Insufficient documentation

## 2015-07-21 DIAGNOSIS — Z8739 Personal history of other diseases of the musculoskeletal system and connective tissue: Secondary | ICD-10-CM | POA: Diagnosis not present

## 2015-07-21 LAB — BASIC METABOLIC PANEL
Anion gap: 8 (ref 5–15)
BUN: 25 mg/dL — ABNORMAL HIGH (ref 6–20)
CO2: 26 mmol/L (ref 22–32)
Calcium: 8.9 mg/dL (ref 8.9–10.3)
Chloride: 102 mmol/L (ref 101–111)
Creatinine, Ser: 1.57 mg/dL — ABNORMAL HIGH (ref 0.61–1.24)
GFR calc Af Amer: 59 mL/min — ABNORMAL LOW (ref >60)
GFR calc non Af Amer: 51 mL/min — ABNORMAL LOW (ref >60)
Glucose, Bld: 187 mg/dL — ABNORMAL HIGH (ref 65–99)
Potassium: 4.2 mmol/L (ref 3.5–5.1)
Sodium: 136 mmol/L (ref 135–145)

## 2015-07-21 LAB — CBC WITH DIFFERENTIAL/PLATELET
Basophils Absolute: 0.1 10*3/uL (ref 0.0–0.1)
Basophils Relative: 1 %
Eosinophils Absolute: 0.5 10*3/uL (ref 0.0–0.7)
Eosinophils Relative: 4 %
HCT: 36.2 % — ABNORMAL LOW (ref 39.0–52.0)
Hemoglobin: 11.7 g/dL — ABNORMAL LOW (ref 13.0–17.0)
Lymphocytes Relative: 31 %
Lymphs Abs: 3.4 10*3/uL (ref 0.7–4.0)
MCH: 22.3 pg — ABNORMAL LOW (ref 26.0–34.0)
MCHC: 32.3 g/dL (ref 30.0–36.0)
MCV: 69.1 fL — ABNORMAL LOW (ref 78.0–100.0)
Monocytes Absolute: 1.3 10*3/uL — ABNORMAL HIGH (ref 0.1–1.0)
Monocytes Relative: 12 %
Neutro Abs: 5.7 10*3/uL (ref 1.7–7.7)
Neutrophils Relative %: 53 %
Platelets: 279 10*3/uL (ref 150–400)
RBC: 5.24 MIL/uL (ref 4.22–5.81)
RDW: 16.1 % — ABNORMAL HIGH (ref 11.5–15.5)
WBC: 10.9 10*3/uL — ABNORMAL HIGH (ref 4.0–10.5)

## 2015-07-21 MED ORDER — CLINDAMYCIN PHOSPHATE 900 MG/50ML IV SOLN
900.0000 mg | Freq: Once | INTRAVENOUS | Status: AC
  Administered 2015-07-21: 900 mg via INTRAVENOUS
  Filled 2015-07-21: qty 50

## 2015-07-21 MED ORDER — LIDOCAINE HCL 2 % IJ SOLN
20.0000 mL | Freq: Once | INTRAMUSCULAR | Status: AC
  Administered 2015-07-21: 400 mg
  Filled 2015-07-21: qty 20

## 2015-07-21 NOTE — ED Notes (Signed)
Abscess to right groin that has already started draining malodorous fluid, redness and swelling to surrounding area.

## 2015-07-21 NOTE — ED Notes (Signed)
Abscess on his left inner thigh. States it is draining.

## 2015-07-21 NOTE — ED Notes (Signed)
PA at bedside.

## 2015-07-22 MED ORDER — CLINDAMYCIN HCL 150 MG PO CAPS: 450.0000 mg | ORAL_CAPSULE | Freq: Three times a day (TID) | ORAL | Status: DC

## 2015-07-22 MED FILL — CLINDAMYCIN HCL 150 MG CAP: 150 | 10 days supply | Qty: 90 | Fill #0

## 2015-07-22 NOTE — ED Provider Notes (Signed)
CSN: [id number]     Arrival date & time 07/21/15  1846 History   First MD Initiated Contact with Patient 07/21/15 1907     Chief Complaint  Patient presents with  . Abscess     (Consider location/radiation/quality/duration/timing/severity/associated sxs/prior Treatment) HPI Patient presents to the emergency department with an abscess to the mid upper thigh.  Patient states has been there for about 2 days and started draining this afternoon.  The patient denies chest pain, shortness of breath, nausea, vomiting, weakness, dizziness, headache, blurred vision, fever, diarrhea, wheezing, rash, near syncope or syncope.  Patient states that palpation and movement make the pain worse.  He states he did not take any medications prior to arrival Past Medical History  Diagnosis Date  . Morbid obesity ([place])   . Allergic rhinitis, cause unspecified   . Other primary cardiomyopathies   . Unspecified essential hypertension   . Insomnia, unspecified   . Anxiety state, unspecified   . Depressive disorder, not elsewhere classified   . Diabetes mellitus without complication ([place])   . CHF (congestive heart failure) ([facility])     right side  . Unspecified sleep apnea     cpap setting of 22 or 23  . Asthma     mild  . Unspecified disorder resulting from impaired renal function   . Anemia in stage 3 chronic kidney disease     sees dr Arty Baumgartner every 3 months  . GERD (gastroesophageal reflux disease)   . Tendinitis     left wrist and elbow and left knee   Past Surgical History  Procedure Laterality Date  . Appendectomy    . Patellar tendon repair      left  . Colonoscopy with propofol N/A 04/15/2014    Procedure: COLONOSCOPY WITH PROPOFOL;  Surgeon: Irene Shipper, MD;  Location: [facility];  Service: Endoscopy;  Laterality: N/A;   Family History  Problem Relation Age of Onset  . Heart disease Mother   . Heart disease Father   . Diabetes Mother   . Diabetes Father    Social History  Substance  Use Topics  . Smoking status: Never Smoker   . Smokeless tobacco: Never Used  . Alcohol Use: 0.6 oz/week    1 Cans of beer per week     Comment: socially    Review of Systems  All other systems negative except as documented in the HPI. All pertinent positives and negatives as reviewed in the HPI.   Allergies  Review of patient's allergies indicates no known allergies.  Home Medications   Prior to Admission medications   Medication Sig Start Date End Date Taking? Authorizing Provider  albuterol (PROVENTIL HFA;VENTOLIN HFA) 108 (90 BASE) MCG/ACT inhaler Inhale 2 puffs into the lungs every 4 (four) hours as needed for wheezing or shortness of breath.    Historical Provider, MD  carvedilol (COREG) 25 MG tablet Take 25 mg by mouth 2 (two) times daily with a meal.     Historical Provider, MD  cholecalciferol (VITAMIN D) 1000 UNITS tablet Take 3,000 Units by mouth every morning.     Historical Provider, MD  clonazePAM (KLONOPIN) 0.5 MG tablet Take 1 tablet (0.5 mg total) by mouth at bedtime as needed. 12/17/13   Deneise Lever, MD  colchicine 0.6 MG tablet Take 0.6 mg by mouth as needed.     Historical Provider, MD  esomeprazole (NEXIUM) 40 MG capsule Take 40 mg by mouth daily as needed (indigestion.).     Historical Provider,  MD  furosemide (LASIX) 40 MG tablet Take 40 mg by mouth 2 (two) times daily.    Historical Provider, MD  hydrALAZINE (APRESOLINE) 25 MG tablet Take 25 mg by mouth 3 (three) times daily.    Historical Provider, MD  HYDROcodone-acetaminophen (NORCO/VICODIN) 5-325 MG per tablet Take 2 tablets by mouth every 4 (four) hours as needed. 01/29/14   Fransico Meadow, PA-C  Insulin Detemir (LEVEMIR FLEXPEN [place]) Inject 30 Units into the skin at bedtime.    Historical Provider, MD  IRON PO Take 1 tablet by mouth every morning.    Historical Provider, MD  Liraglutide (VICTOZA [place]) Inject into the skin. 1.2 mg at bedtime    Historical Provider, MD  Magnesium 250 MG TABS Take 1 tablet by  mouth every morning.     Historical Provider, MD  PARoxetine (PAXIL) 10 MG tablet Take 10 mg by mouth daily as needed.     Historical Provider, MD  potassium chloride SA (K-DUR,KLOR-CON) 20 MEQ tablet Take 20 mEq by mouth 2 (two) times daily.      Historical Provider, MD  spironolactone (ALDACTONE) 25 MG tablet Take 25 mg by mouth 2 (two) times daily.    Historical Provider, MD  Testosterone (ANDROGEL) 20.25 MG/1.25GM (1.62%) GEL Place 1 application onto the skin every morning.     Historical Provider, MD   BP 183/109 mmHg  Pulse 79  Temp(Src) 98.7 F (37.1 C) (Oral)  Resp 18  Ht 5\' 11"  (1.803 m)  Wt 167.831 kg  BMI 51.63 kg/m2  SpO2 99% Physical Exam  Constitutional: He is oriented to person, place, and time. He appears well-developed and well-nourished. No distress.  HENT:  Head: Normocephalic and atraumatic.  Mouth/Throat: Oropharynx is clear and moist.  Eyes: Pupils are equal, round, and reactive to light.  Neck: Normal range of motion. Neck supple.  Cardiovascular: Normal rate, regular rhythm and normal heart sounds.  Exam reveals no gallop and no friction rub.   No murmur heard. Pulmonary/Chest: Effort normal and breath sounds normal. No respiratory distress. He has no wheezes.  Abdominal: There is no tenderness.  Neurological: He is alert and oriented to person, place, and time. He exhibits normal muscle tone. Coordination normal.  Skin: Skin is warm and dry. No rash noted. There is erythema.     Psychiatric: He has a normal mood and affect. His behavior is normal.  Nursing note and vitals reviewed.   ED Course  Procedures (including critical care time) Labs Review Labs Reviewed  BASIC METABOLIC PANEL - Abnormal; Notable for the following:    Glucose, Bld 187 (*)    BUN 25 (*)    Creatinine, Ser 1.57 (*)    GFR calc non Af Amer 51 (*)    GFR calc Af Amer 59 (*)    All other components within normal limits  CBC WITH DIFFERENTIAL/PLATELET - Abnormal; Notable for the  following:    WBC 10.9 (*)    Hemoglobin 11.7 (*)    HCT 36.2 (*)    MCV 69.1 (*)    MCH 22.3 (*)    RDW 16.1 (*)    Monocytes Absolute 1.3 (*)    All other components within normal limits    Imaging Review No results found. I have personally reviewed and evaluated these images and lab results as part of my medical decision-making. The patient states that he does not want to be admitted to the hospital and will monitor his condition at home.  He is given  clindamycin and will be discharged home with this.  Told to return here as needed.  I advised him that this could get more severe and will need to return here for any changes or worsening in his condition    Dalia Heading, PA-C 07/22/15 0033  Dalia Heading, PA-C 07/22/15 [id number]  Tanna Furry, MD 08/01/15 2258

## 2015-07-22 NOTE — Discharge Instructions (Signed)
Return here as needed if there is any worsening in your condition.  You will need to follow-up here in the emergency department.  Also follow-up with your primary care doctor.  Keep the area clean and dry.  Have the packing removed and the area rechecked here in 2 days

## 2015-07-22 NOTE — ED Notes (Signed)
EMTALA and departure condition done on wrong pt

## 2015-10-19 DIAGNOSIS — R0602 Shortness of breath: Secondary | ICD-10-CM | POA: Insufficient documentation

## 2015-10-19 HISTORY — DX: Shortness of breath: R06.02

## 2015-10-22 DIAGNOSIS — Z91148 Patient's other noncompliance with medication regimen for other reason: Secondary | ICD-10-CM

## 2015-10-22 DIAGNOSIS — I5022 Chronic systolic (congestive) heart failure: Secondary | ICD-10-CM

## 2015-10-22 DIAGNOSIS — Z9114 Patient's other noncompliance with medication regimen: Secondary | ICD-10-CM

## 2015-10-22 HISTORY — DX: Chronic systolic (congestive) heart failure: I50.22

## 2015-10-22 HISTORY — DX: Patient's other noncompliance with medication regimen: Z91.14

## 2015-10-22 HISTORY — DX: Patient's other noncompliance with medication regimen for other reason: Z91.148

## 2015-12-22 ENCOUNTER — Encounter (INDEPENDENT_AMBULATORY_CARE_PROVIDER_SITE_OTHER): Payer: Self-pay

## 2015-12-22 ENCOUNTER — Encounter: Payer: Self-pay | Admitting: Internal Medicine

## 2015-12-22 ENCOUNTER — Ambulatory Visit (INDEPENDENT_AMBULATORY_CARE_PROVIDER_SITE_OTHER): Payer: Medicaid Other | Admitting: Internal Medicine

## 2015-12-22 VITALS — BP 124/78 | HR 85 | Ht 71.0 in | Wt 376.4 lb

## 2015-12-22 DIAGNOSIS — G4733 Obstructive sleep apnea (adult) (pediatric): Secondary | ICD-10-CM

## 2015-12-22 NOTE — Progress Notes (Signed)
Subjective:    Patient ID: Dustin Mcclain, male    DOB: 03/16/1966, 50 y.o.   MRN: [id number]  HPI 11/25/10-  24 yoM never smoker followed for OSA/ insomnia, complicated by obesity, HBP, cardiomyopathy, renal insufficiency, DM. Last here - October 23, 2009- note reviewed CPAP only being used a couple of times/ week around when he works out. He can now walk as much as he wants, having improved his endurance. He is really pleased with how much better he is feeling.  Says Cardiomyopathy has improved. - [facility] Allergic rhinitis- little problem this spring.   11/28/11-  73 yoM never smoker followed for OSA/ insomnia, complicated by obesity, HBP, cardiomyopathy, renal insufficiency, DM.  Pt states he is wearing CPAP nightly approx 6-7 hrs. Pt denies problems with mask or pressure.   He is pleased now with CPAP/ [facility] and describes good compliance and control. We need to verify his pressure setting with his DME company  12/17/12- 46 yoM never smoker followed for OSA/ insomnia, complicated by obesity, HBP, cardiomyopathy, renal insufficiency, DM. FOLLOWS [id number] CPAP 14/ [facility] every night for at least 6 hours; pressure working well for patient; needs new supplies through [facility]. Compliance and comfort with CPAP per good at this pressure. Using a fullface mask with humidifier. Sleeping well without medication.  12/17/13- 10 yoM never smoker followed for OSA/ insomnia, complicated by obesity, HBP, cardiomyopathy, renal insufficiency, DM. FOLLOWS FOR: Wears CPAP14/ [facility] every night for about 5.5 to 7.5 hours; pressure works well for patient; DME is [facility]. No snoring, feels better with CPAP. Clonazepam does help occasionally for insomnia.  12/18/14- 14 yoM never smoker followed for OSA/ insomnia, complicated by obesity, HBP, cardiomyopathy, renal insufficiency, DM. Follows For: Uses CPAP 14/ [facility] an average of 5 hours nightly, mask fits well. Pt states breathing is doing well with no new compliants.   Doing well with CPAP 14/[facility] and won't sleep without it. We discussed weight.  12/22/2015-50 year old male never smoker followed for OSA/insomnia, complicated by obesity, HBP, cardiomyopathy, renal insufficiency, DM CPAP 14/Advanced FOLLOWS FOR: DME: [facility]; will need to order DL-pt did not bring cord with machine today. Pt wears CPAP every night for about 4-6 hours; pressure is working well for patient and needs face mask and usual supplies. He says he really likes CPAP at 14, sleeping well without snoring. He has not been able to accomplish meaningful weight loss.  ROS-see HPI Constitutional:   No-   weight loss, night sweats, fevers, chills, fatigue, lassitude. HEENT:   No-  headaches, difficulty swallowing, tooth/dental problems, sore throat,       No-  sneezing, itching, ear ache, nasal congestion, post nasal drip,  CV:  No-   chest pain, orthopnea, PND, swelling in lower extremities, anasarca,  dizziness, palpitations Resp: No-   shortness of breath with exertion or at rest.              No-   productive cough,  No non-productive cough,  No- coughing up of blood.              No-   change in color of mucus.  No- wheezing.   Skin: No-   rash or lesions. GI:  No-   heartburn, indigestion, abdominal pain, nausea, vomiting,  GU: . MS:  No-   joint pain or swelling.  Neuro-     nothing unusual Psych:  No- change in mood or affect. No depression or anxiety.  No memory loss.  Objective:   Physical  Exam General- Alert, Oriented, Affect-appropriate, Distress- none acute   +Very obese Skin- rash-none, lesions- none, excoriation- none Lymphadenopathy- none Head- atraumatic            Eyes- Gross vision intact, PERRLA, conjunctivae clear secretions            Ears- Hearing, canals normal            Nose- Clear, no- Septal dev, mucus, polyps, erosion, perforation             Throat- Mallampati III-IV , mucosa clear , drainage- none, tonsils- atrophic Neck- flexible , trachea midline, no  stridor , thyroid nl, carotid no bruit Chest - symmetrical excursion , unlabored           Heart/CV- RRR , no murmur , no gallop  , no rub, nl s1 s2                           - JVD- none , edema- none, stasis changes- none, varices- none           Lung- clear to P&A, wheeze- none, cough- none , dullness-none, rub- none           Chest wall-  Abd-  Br/ Gen/ Rectal- Not done, not indicated Extrem- cyanosis- none, clubbing, none, atrophy- none, strength- nl Neuro- grossly intact to observatio  Assessment & Plan:

## 2015-12-22 NOTE — Patient Instructions (Signed)
Order- DME [facility]- Continue CPAP 14. Needs replacement CPAP mask of choice, supplies. Continue humidifier, AirView.  Please send pressure compliance download.  Suggest you contact the [facility] across the street from this office or possibly a weight loss program in one of the local towns, and see what else they can do to help you with your weight.

## 2015-12-22 NOTE — Assessment & Plan Note (Signed)
He describes excellent compliance and markedly improved quality of life using CPAP. We need download for documentation. He needs replacement mask and supplies-[facility]

## 2015-12-22 NOTE — Assessment & Plan Note (Signed)
He dates onset of weight gain to his development of cardiomyopathy and kidney problems. Plan-I suggested he revisited bariatric program for help with medical management of his weight. He says he was turned down as a surgical candidate.

## 2016-04-26 ENCOUNTER — Telehealth: Payer: Self-pay | Admitting: Internal Medicine

## 2016-04-26 DIAGNOSIS — G4733 Obstructive sleep apnea (adult) (pediatric): Secondary | ICD-10-CM

## 2016-04-26 NOTE — Telephone Encounter (Signed)
Called and spoke to pt. Informed him of the new order for replacement CPAP. Order placed. Pt verbalized understanding and denied any further questions or concerns at this time.

## 2016-04-26 NOTE — Telephone Encounter (Signed)
Ok order DME [facility]- replacement for broken CPAP machine, 14 cwp, continuing mask of choice, humidifier, supplies, AirView   Dx OSA

## 2016-04-26 NOTE — Telephone Encounter (Signed)
Called and spoke to pt. Pt is requesting a replacement CPAP, his current one is broken. Pt states [facility] told him he is eligible for a new one, current CPAP is > 48 years old.  An order was placed in 12/2015: "[facility]- Continue CPAP 14. Needs replacement CPAP mask of choice, supplies. Continue humidifier, AirView.  Please send pressure compliance download."  Unsure if this was for a replacement CPAP or replacement CPAP mask.   Dr. Annamaria Boots please advise. Thanks.

## 2016-05-16 ENCOUNTER — Telehealth: Payer: Self-pay | Admitting: Internal Medicine

## 2016-05-16 NOTE — Telephone Encounter (Signed)
Spoke with pt. Advised him that per the order that was placed on 04/26/16, we are trying to locate his sleep study. [facility] is needing a copy of this. Pt verbalized understanding. Nothing further was needed at this time.

## 2016-06-01 ENCOUNTER — Telehealth: Payer: Self-pay | Admitting: *Deleted

## 2016-06-01 ENCOUNTER — Telehealth: Payer: Self-pay | Admitting: Internal Medicine

## 2016-06-01 DIAGNOSIS — G4733 Obstructive sleep apnea (adult) (pediatric): Secondary | ICD-10-CM

## 2016-06-01 NOTE — Telephone Encounter (Signed)
lmtcb X1 

## 2016-06-01 NOTE — Telephone Encounter (Signed)
-----   Message from Joellen Jersey sent at 06/01/2016  2:43 PM EST ----- Did we ever get a copy of his sleep study

## 2016-06-02 NOTE — Telephone Encounter (Signed)
ATC pt, line busy WCB 

## 2016-06-02 NOTE — Telephone Encounter (Signed)
Patient is returning phone call.  °

## 2016-06-02 NOTE — Telephone Encounter (Signed)
Spoke with anita and she has mailed this information to the pt. Nothing further is needed.

## 2016-07-19 ENCOUNTER — Ambulatory Visit (HOSPITAL_BASED_OUTPATIENT_CLINIC_OR_DEPARTMENT_OTHER): Payer: Medicaid Other

## 2016-08-15 ENCOUNTER — Ambulatory Visit (HOSPITAL_BASED_OUTPATIENT_CLINIC_OR_DEPARTMENT_OTHER): Payer: Medicaid Other | Attending: Internal Medicine | Admitting: Internal Medicine

## 2016-08-15 VITALS — Ht 71.0 in | Wt 365.0 lb

## 2016-08-15 DIAGNOSIS — G4733 Obstructive sleep apnea (adult) (pediatric): Secondary | ICD-10-CM | POA: Diagnosis present

## 2016-08-27 DIAGNOSIS — G4733 Obstructive sleep apnea (adult) (pediatric): Secondary | ICD-10-CM | POA: Diagnosis not present

## 2016-08-27 NOTE — Procedures (Signed)
  Patient Name: Dustin Mcclain, Sitter Date: 08/15/2016 Gender: Male D.O.B: June 16, 1965 Age (years): 17 Referring Provider: Baird Lyons MD, ABSM Height (inches): 71 Interpreting Physician: Baird Lyons MD, ABSM Weight (lbs): 365 RPSGT: Madelon Lips BMI: 51 MRN: [id number] Neck Size: 19.00 CLINICAL INFORMATION Sleep Study Type: NPSG  Indication for sleep study: OSA  Epworth Sleepiness Score: 14  SLEEP STUDY TECHNIQUE As per the AASM Manual for the Scoring of Sleep and Associated Events v2.3 (April 2016) with a hypopnea requiring 4% desaturations.  The channels recorded and monitored were frontal, central and occipital EEG, electrooculogram (EOG), submentalis EMG (chin), nasal and oral airflow, thoracic and abdominal wall motion, anterior tibialis EMG, snore microphone, electrocardiogram, and pulse oximetry.  MEDICATIONS Medications self-administered by patient taken the night of the study : CARVEDILOL, HYDRALAZINE, POTASSIUM CHLORIDE, SPIRONOLACTONE  SLEEP ARCHITECTURE The study was initiated at 10:02:08 PM and ended at 4:05:56 AM.  Sleep onset time was 12.2 minutes and the sleep efficiency was 62.7%. The total sleep time was 228.0 minutes.  Stage REM latency was 98.0 minutes.  The patient spent 12.28% of the night in stage N1 sleep, 69.30% in stage N2 sleep, 0.00% in stage N3 and 18.42% in REM.  Alpha intrusion was absent.  Supine sleep was 5.26%.  RESPIRATORY PARAMETERS The overall apnea/hypopnea index (AHI) was 10.8 per hour. There were 6 total apneas, including 5 obstructive, 1 central and 0 mixed apneas. There were 35 hypopneas and 13 RERAs.  The AHI during Stage REM sleep was 37.1 per hour.  AHI while supine was 45.0 per hour.  The mean oxygen saturation was 93.26%. The minimum SpO2 during sleep was 83.00%.  Loud snoring was noted during this study.  CARDIAC DATA The 2 lead EKG demonstrated sinus rhythm. The mean heart rate was 76.60 beats per minute. Other  EKG findings include: PVCs.  LEG MOVEMENT DATA The total PLMS were 7 with a resulting PLMS index of 1.84. Associated arousal with leg movement index was 0.3 .  IMPRESSIONS - Mild obstructive sleep apnea occurred during this study (AHI = 10.8/h). - Insufficient early events to meet protocol requirements for split CPAP titration. - No significant central sleep apnea occurred during this study (CAI = 0.3/h). - Mild oxygen desaturation was noted during this study (Min O2 = 83.00%). - The patient snored with Loud snoring volume. - EKG findings include PVCs. - Clinically significant periodic limb movements did not occur during sleep. No significant associated arousals.  DIAGNOSIS - Obstructive Sleep Apnea (327.23 [G47.33 ICD-10]) RECOMMENDATIONS - Therapeutic CPAP titration to determine optimal pressure required to alleviate sleep disordered breathing. Other options based on clinical judgment. - Positional therapy avoiding supine position during sleep. - Avoid alcohol, sedatives and other CNS depressants that may worsen sleep apnea and disrupt normal sleep architecture. - Sleep hygiene should be reviewed to assess factors that may improve sleep quality. - Weight management and regular exercise should be initiated or continued if appropriate.  [Electronically signed] 08/27/2016 02:17 PM  Baird Lyons MD, [place], American Board of Sleep Medicine   NPI: [id number]  [place], American Board of Sleep Medicine  ELECTRONICALLY SIGNED ON:  08/27/2016, 2:14 PM [place] PH: [phone]   FX: [phone] [address]

## 2016-09-12 ENCOUNTER — Telehealth: Payer: Self-pay | Admitting: Internal Medicine

## 2016-09-12 DIAGNOSIS — G4733 Obstructive sleep apnea (adult) (pediatric): Secondary | ICD-10-CM

## 2016-09-12 NOTE — Telephone Encounter (Signed)
lmom tcb x1 number placed in message is incorrect please check chart

## 2016-09-12 NOTE — Telephone Encounter (Signed)
Pt returning call pt can be reached @ [phone].Dustin Mcclain'

## 2016-09-12 NOTE — Telephone Encounter (Signed)
Lm with pt's Mother to have pt return our call.

## 2016-09-13 NOTE — Telephone Encounter (Signed)
Order- DME [facility]- replacement for old/ broken CPAP machine, Auto 5-20, mask of choice, humidifeir, supplies, AirView    Dx OSA     Based on new sleep study 08/25/16  Please make him a return to me in 3 months

## 2016-09-13 NOTE — Telephone Encounter (Signed)
CY  Please Advise-  Pt called in wanting to know if an order could be placed for him to obtain a new cpap machine. Pt states his machine is not blowing out any air. Pt did have a split night sleep study on 08/15/16  DME:[facility]

## 2016-09-13 NOTE — Telephone Encounter (Signed)
Patient is returning call, asked to call him back at [phone] (pt home number).

## 2016-09-13 NOTE — Telephone Encounter (Signed)
Pt is aware that order has been placed for cpap replacement.  Pt has been scheduled for 12-15-16 after coap setup. Nothing further needed.

## 2016-09-15 ENCOUNTER — Telehealth: Payer: Self-pay | Admitting: Internal Medicine

## 2016-09-15 NOTE — Telephone Encounter (Signed)
Error.Stanley A Dalton ° °

## 2016-09-27 ENCOUNTER — Ambulatory Visit (INDEPENDENT_AMBULATORY_CARE_PROVIDER_SITE_OTHER): Payer: Medicaid Other | Admitting: Adult Health

## 2016-09-27 ENCOUNTER — Encounter: Payer: Self-pay | Admitting: Adult Health

## 2016-09-27 DIAGNOSIS — G4733 Obstructive sleep apnea (adult) (pediatric): Secondary | ICD-10-CM | POA: Diagnosis not present

## 2016-09-27 NOTE — Patient Instructions (Signed)
Order for new C Pap machine Continue on C Pap at bedtime. Work on weight loss Do not drive a sleepy Follow-up with Dr. Annamaria Boots in 3 months as planned and as needed

## 2016-09-27 NOTE — Addendum Note (Signed)
Addended by: Parke Poisson E on: 09/27/2016 11:42 AM   Modules accepted: Orders

## 2016-09-27 NOTE — Assessment & Plan Note (Addendum)
Mild obstructive sleep apnea with multiple comorbidities. Patient is continue on C Pap. Order for new machine has been sent to the DME company. Change to Auto Set 10-14cm H2o  Check download on return .   Plan  Patient Instructions  Order for new C Pap machine Continue on C Pap at bedtime. Work on weight loss Do not drive a sleepy Follow-up with Dr. Annamaria Boots in 3 months as planned and as needed

## 2016-09-27 NOTE — Progress Notes (Signed)
@Patient  ID: Dustin Mcclain, male    DOB: [birth year], 51 y.o.   MRN: [id number]  Chief Complaint  Patient presents with  . Follow-up    OSA    Referring provider: Glendon Axe, MD  HPI: 51 year old male never smoker followed for obstructive sleep apnea and insomnia. He has hypertension, Carter myopathy and renal insufficiency with diabetes  09/27/2016 Follow up : OSA  Patient presents for a follow-up.. Patient's machine stopped working recently. And he needed a new C Pap machine. Insurance required a new sleep study. Patient underwent a sleep study on 08/15/2016 that showed mild sleep apnea with AHI at 10. Patient says he cannot live without his C Pap machine. He feels much better on it. Feels more rested with less daytime sleepiness. Patient says he wears it every single night for at least 6-7 hours.. Patient was on a previous setting of 14 side of his H2O. Patient would like to see if we can cut that down just slightly as it felt like it was too high a pressure at times.  No Known Allergies  Immunization History  Administered Date(s) Administered  . Influenza Split 02/07/2012, 02/06/2013, 02/07/2016  . Influenza Whole 06/11/2009, 03/10/2011  . Influenza,inj,Quad PF,36+ Mos 03/09/2014    Past Medical History:  Diagnosis Date  . Allergic rhinitis, cause unspecified   . Anemia in stage 3 chronic kidney disease    sees dr Arty Baumgartner every 3 months  . Anxiety state, unspecified   . Asthma    mild  . CHF (congestive heart failure) (HCC)    right side  . Depressive disorder, not elsewhere classified   . Diabetes mellitus without complication ([place])   . GERD (gastroesophageal reflux disease)   . Insomnia, unspecified   . Morbid obesity ([place])   . Other primary cardiomyopathies   . Tendinitis    left wrist and elbow and left knee  . Unspecified disorder resulting from impaired renal function   . Unspecified essential hypertension   . Unspecified sleep apnea    cpap setting  of 22 or 23    Tobacco History: History  Smoking Status  . Never Smoker  Smokeless Tobacco  . Never Used   Counseling given: Not Answered   Outpatient Encounter Prescriptions as of 09/27/2016  Medication Sig  . albuterol (PROVENTIL HFA;VENTOLIN HFA) 108 (90 BASE) MCG/ACT inhaler Inhale 2 puffs into the lungs every 4 (four) hours as needed for wheezing or shortness of breath.  . carvedilol (COREG) 25 MG tablet Take 25 mg by mouth 2 (two) times daily with a meal.   . cholecalciferol (VITAMIN D) 1000 UNITS tablet Take 3,000 Units by mouth every morning.   . colchicine 0.6 MG tablet Take 0.6 mg by mouth as needed.   . furosemide (LASIX) 40 MG tablet Take 40 mg by mouth 2 (two) times daily.  . hydrALAZINE (APRESOLINE) 25 MG tablet Take 25 mg by mouth 3 (three) times daily.  . Insulin Detemir (LEVEMIR FLEXPEN [place]) Inject 60 Units into the skin at bedtime.   . Liraglutide (VICTOZA [place]) Inject into the skin. 1.2 mg at bedtime  . Magnesium 250 MG TABS Take 1 tablet by mouth every morning.   . potassium chloride SA (K-DUR,KLOR-CON) 20 MEQ tablet Take 20 mEq by mouth 2 (two) times daily.  Marland Kitchen spironolactone (ALDACTONE) 25 MG tablet Take 25 mg by mouth 2 (two) times daily.  . Testosterone (ANDROGEL) 20.25 MG/1.25GM (1.62%) GEL Place 1 application onto the skin every morning.   . [  DISCONTINUED] PARoxetine (PAXIL) 10 MG tablet Take 10 mg by mouth daily as needed.   . clonazePAM (KLONOPIN) 0.5 MG tablet Take 1 tablet (0.5 mg total) by mouth at bedtime as needed. (Patient not taking: Reported on 09/27/2016)  . esomeprazole (NEXIUM) 40 MG capsule Take 40 mg by mouth daily as needed (indigestion.).   . [DISCONTINUED] clindamycin (CLEOCIN) 150 MG capsule Take 3 capsules (450 mg total) by mouth 3 (three) times daily. (Patient not taking: Reported on 09/27/2016)  . [DISCONTINUED] HYDROcodone-acetaminophen (NORCO/VICODIN) 5-325 MG per tablet Take 2 tablets by mouth every 4 (four) hours as needed. (Patient not  taking: Reported on 09/27/2016)  . [DISCONTINUED] IRON PO Take 1 tablet by mouth every morning.  . [DISCONTINUED] potassium chloride SA (K-DUR,KLOR-CON) 20 MEQ tablet Take 20 mEq by mouth 2 (two) times daily.     No facility-administered encounter medications on file as of 09/27/2016.      Review of Systems  Constitutional:   No  weight loss, night sweats,  Fevers, chills, fatigue, or  lassitude.  HEENT:   No headaches,  Difficulty swallowing,  Tooth/dental problems, or  Sore throat,                No sneezing, itching, ear ache, nasal congestion, post nasal drip,   CV:  No chest pain,  Orthopnea, PND, swelling in lower extremities, anasarca, dizziness, palpitations, syncope.   GI  No heartburn, indigestion, abdominal pain, nausea, vomiting, diarrhea, change in bowel habits, loss of appetite, bloody stools.   Resp: No shortness of breath with exertion or at rest.  No excess mucus, no productive cough,  No non-productive cough,  No coughing up of blood.  No change in color of mucus.  No wheezing.  No chest wall deformity  Skin: no rash or lesions.  GU: no dysuria, change in color of urine, no urgency or frequency.  No flank pain, no hematuria   MS:  No joint pain or swelling.  No decreased range of motion.  No back pain.    Physical Exam  BP 136/86 (BP Location: Left Arm, Cuff Size: Large)   Pulse 89   Ht 5\' 11"  (1.803 m)   Wt (!) 316 lb 12.8 oz (143.7 kg)   SpO2 98%   BMI 44.18 kg/m   GEN: A/Ox3; pleasant , NAD, obese   HEENT:  [place]/AT,  EACs-clear, TMs-wnl, NOSE-clear, THROAT-clear, no lesions, no postnasal drip or exudate noted. Class 2-3 MP airway   NECK:  Supple w/ fair ROM; no JVD; normal carotid impulses w/o bruits; no thyromegaly or nodules palpated; no lymphadenopathy.    RESP  Clear  P & A; w/o, wheezes/ rales/ or rhonchi. no accessory muscle use, no dullness to percussion  CARD:  RRR, no m/r/g, no peripheral edema, pulses intact, no cyanosis or clubbing.  GI:    Soft & nt; nml bowel sounds; no organomegaly or masses detected.   Musco: Warm bil, no deformities or joint swelling noted.   Neuro: alert, no focal deficits noted.    Skin: Warm, no lesions or rashes      BNP No results found for: BNP  ProBNP  Imaging: No results found.   Assessment & Plan:   Obstructive sleep apnea Mild obstructive sleep apnea with multiple comorbidities. Patient is continue on C Pap. Order for new machine has been sent to the DME company. Change to Auto Set 10-14cm H2o  Check download on return .   Plan  Patient Instructions  Order for new  C Pap machine Continue on C Pap at bedtime. Work on weight loss Do not drive a sleepy Follow-up with Dr. Annamaria Boots in 3 months as planned and as needed     OBESITY, MORBID Wt loss      Rexene Edison, NP 09/27/2016

## 2016-09-27 NOTE — Assessment & Plan Note (Signed)
Wt loss  

## 2016-12-14 ENCOUNTER — Encounter: Payer: Self-pay | Admitting: Internal Medicine

## 2016-12-15 ENCOUNTER — Encounter: Payer: Self-pay | Admitting: Internal Medicine

## 2016-12-15 ENCOUNTER — Ambulatory Visit (INDEPENDENT_AMBULATORY_CARE_PROVIDER_SITE_OTHER): Payer: Medicaid Other | Admitting: Internal Medicine

## 2016-12-15 VITALS — BP 120/78 | HR 77 | Ht 71.0 in | Wt 352.4 lb

## 2016-12-15 DIAGNOSIS — G4733 Obstructive sleep apnea (adult) (pediatric): Secondary | ICD-10-CM | POA: Diagnosis not present

## 2016-12-15 NOTE — Assessment & Plan Note (Signed)
He has been able to lose a few pounds, with a long way to go. If he continues, consider bariatric referral. Co- morbidities include HBP, renal insufficiency, DM2

## 2016-12-15 NOTE — Progress Notes (Signed)
Subjective:    Patient ID: Dustin Mcclain, male    DOB: 1965-12-27, 51 y.o.   MRN: [id number]  HPI male never smoker followed for OSA/insomnia, complicated by obesity, HBP, cardiomyopathy, renal insufficiency, DM NPSG 08/15/16   AHI 10.8/ hr, desaturation to 83%, body weight 365 lbs   .--------------------------------------------------------------------------------------  12/22/2015-51 year old male never smoker followed for OSA/insomnia, complicated by obesity, HBP, cardiomyopathy, renal insufficiency, DM CPAP 14/[facility] FOLLOWS FOR: DME: [facility]; will need to order DL-pt did not bring cord with machine today. Pt wears CPAP every night for about 4-6 hours; pressure is working well for patient and needs face mask and usual supplies. He says he really likes CPAP at 14, sleeping well without snoring. He has not been able to accomplish meaningful weight loss.  12/15/16- 51 year old male never smoker followed for OSA/insomnia, complicated by obesity, HBP, cardiomyopathy, renal insufficiency, DM CPAP auto 5-20//[facility]  New machine 08/2016  > today change to auto 5-15 FOLLOWS FOR: DME [facility]. Pt states he wears CPAP nightly and DL attached.  New machine in [place]- likes quieter machine and good display.    Weight today 352 lbs Download 83% 4hr compliance, AHI 1.1/ hr. Sometimes pressure too high and he takes it off.  He realized when his old machine broke that he sleeps very much better with CPAP   ROS-see HPI Constitutional:   No-   weight loss, night sweats, fevers, chills, fatigue, lassitude. HEENT:   No-  headaches, difficulty swallowing, tooth/dental problems, sore throat,       No-  sneezing, itching, ear ache, nasal congestion, post nasal drip,  CV:  No-   chest pain, orthopnea, PND, swelling in lower extremities, anasarca,  dizziness, palpitations Resp: No-   shortness of breath with exertion or at rest.              No-   productive cough,  No non-productive cough,  No- coughing up of blood.               No-   change in color of mucus.  No- wheezing.   Skin: No-   rash or lesions. GI:  No-   heartburn, indigestion, abdominal pain, nausea, vomiting,  GU: . MS:  No-   joint pain or swelling.  Neuro-     nothing unusual Psych:  No- change in mood or affect. No depression or anxiety.  No memory loss.  Objective:   Physical Exam General- Alert, Oriented, Affect-appropriate, Distress- none acute   +Very obese still Skin- rash-none, lesions- none, excoriation- none Lymphadenopathy- none Head- atraumatic            Eyes- Gross vision intact, PERRLA, conjunctivae clear secretions            Ears- Hearing, canals normal            Nose- Clear, no- Septal dev, mucus, polyps, erosion, perforation             Throat- Mallampati III-IV , mucosa clear , drainage- none, tonsils- atrophic Neck- flexible , trachea midline, no stridor , thyroid nl, carotid no bruit Chest - symmetrical excursion , unlabored           Heart/CV- RRR , no murmur , no gallop  , no rub, nl s1 s2                           - JVD- none , edema- none, stasis changes- none, varices- none  Lung- clear to P&A, wheeze- none, cough- none , dullness-none, rub- none           Chest wall-  Abd-  Br/ Gen/ Rectal- Not done, not indicated Extrem- cyanosis- none, clubbing, none, atrophy- none, strength- nl Neuro- grossly intact to observation  Assessment & Plan:

## 2016-12-15 NOTE — Assessment & Plan Note (Signed)
Compliance acceptable and can improve. Pressure gets uncomfortable. Plan- Reduce auto range to 5-15

## 2016-12-15 NOTE — Patient Instructions (Signed)
Order DME advanced- please change auto pap range to 5-15, continue mask of choice, humidifier, supplies, AirView    Dx OSA  Proud of you for working on your weight- Looking good  !!  Please call if we can help

## 2016-12-16 ENCOUNTER — Telehealth: Payer: Self-pay | Admitting: Internal Medicine

## 2016-12-16 NOTE — Telephone Encounter (Signed)
lmomtcb x1 

## 2016-12-19 NOTE — Telephone Encounter (Signed)
Pt returned call. Pt states [name] had called him regarding an issue/subject but pt does not recall what it was about. There is nothing in the chart indicating what this was about.   [name] please advise. Thanks.

## 2016-12-19 NOTE — Telephone Encounter (Signed)
lmtcb x2 for pt. 

## 2016-12-21 NOTE — Telephone Encounter (Signed)
[name] please advise. Thanks. 

## 2016-12-23 NOTE — Telephone Encounter (Signed)
Spoke with patient-had questions about visit and some advice that was given. Nothing more needed at this time.

## 2016-12-23 NOTE — Telephone Encounter (Signed)
[name] please advise. Thanks. 

## 2017-10-03 DIAGNOSIS — E782 Mixed hyperlipidemia: Secondary | ICD-10-CM | POA: Insufficient documentation

## 2017-10-03 HISTORY — DX: Mixed hyperlipidemia: E78.2

## 2017-12-15 ENCOUNTER — Ambulatory Visit: Payer: Medicaid Other | Admitting: Internal Medicine

## 2017-12-19 ENCOUNTER — Encounter: Payer: Self-pay | Admitting: Internal Medicine

## 2017-12-19 ENCOUNTER — Ambulatory Visit: Payer: Medicaid Other | Admitting: Internal Medicine

## 2017-12-19 VITALS — BP 132/80 | HR 73 | Ht 71.0 in | Wt 373.4 lb

## 2017-12-19 DIAGNOSIS — G4733 Obstructive sleep apnea (adult) (pediatric): Secondary | ICD-10-CM | POA: Diagnosis not present

## 2017-12-19 DIAGNOSIS — I428 Other cardiomyopathies: Secondary | ICD-10-CM

## 2017-12-19 MED ORDER — SACUBITRIL-VALSARTAN 49-51 MG PO TABS: 1.0000 | ORAL_TABLET | Freq: Two times a day (BID) | ORAL | 99 refills | Status: DC

## 2017-12-19 NOTE — Progress Notes (Signed)
Subjective:    Patient ID: Dustin Mcclain, male    DOB: 1965/06/02, 52 y.o.   MRN: [id number]  HPI male never smoker followed for OSA/insomnia, complicated by obesity, HBP, cardiomyopathy, renal insufficiency, DM NPSG 08/15/16   AHI 10.8/ hr, desaturation to 83%, body weight 365 lbs   .------------------------------------------------------------------------------------- 12/15/16- 52 year old male never smoker followed for OSA/insomnia, complicated by obesity, HBP, cardiomyopathy, renal insufficiency, DM CPAP auto 5-20//[facility]  New machine 08/2016  > today change to auto 5-15 FOLLOWS FOR: DME [facility]. Pt states he wears CPAP nightly and DL attached.  New machine in [place]- likes quieter machine and good display.    Weight today 352 lbs Download 83% 4hr compliance, AHI 1.1/ hr. Sometimes pressure too high and he takes it off.  He realized when his old machine broke that he sleeps very much better with CPAP  12/19/2017- 52 year old male never smoker followed for OSA/insomnia, complicated by obesity, HBP, Cardiomyopathy/CHF, renal insufficiency, DM, GERD, CPAP auto 5-15/[facility] -----OSA; DME: [facility] Pt wears CPAP nightly and DL attached. No new supplies needed at this time.  Weight today 373 pounds Clonazepam 0.5 mg He says he gets "best sleep" with CPAP.  Download 100% compliance AHI 0.9/hour. He reports better cardiac output and improving cardiomyopathy.   ROS-see HPI    + = positive Constitutional:   No-   weight loss, night sweats, fevers, chills, fatigue, lassitude. HEENT:   No-  headaches, difficulty swallowing, tooth/dental problems, sore throat,       No-  sneezing, itching, ear ache, nasal congestion, post nasal drip,  CV:  No-   chest pain, orthopnea, PND, swelling in lower extremities, anasarca,  dizziness, palpitations Resp: No-   shortness of breath with exertion or at rest.              No-   productive cough,  No non-productive cough,  No- coughing up of blood.              No-    change in color of mucus.  No- wheezing.   Skin: No-   rash or lesions. GI:  No-   heartburn, indigestion, abdominal pain, nausea, vomiting,  GU: . MS:  No-   joint pain or swelling.  Neuro-     nothing unusual Psych:  No- change in mood or affect. No depression or anxiety.  No memory loss.  Objective:   Physical Exam General- Alert, Oriented, Affect-appropriate, Distress- none acute   +Very obese still Skin- rash-none, lesions- none, excoriation- none Lymphadenopathy- none Head- atraumatic            Eyes- Gross vision intact, PERRLA, conjunctivae clear secretions            Ears- Hearing, canals normal            Nose- Clear, no- Septal dev, mucus, polyps, erosion, perforation             Throat- Mallampati III-IV , mucosa clear , drainage- none, tonsils- atrophic Neck- flexible , trachea midline, no stridor , thyroid nl, carotid no bruit Chest - symmetrical excursion , unlabored           Heart/CV- RRR , no murmur , no gallop  , no rub, nl s1 s2                           - JVD- none , edema- none, stasis changes- none, varices- none  Lung- clear to P&A, wheeze- none, cough- none , dullness-none, rub- none           Chest wall-  Abd-  Br/ Gen/ Rectal- Not done, not indicated Extrem- cyanosis- none, clubbing, none, atrophy- none, strength- nl Neuro- grossly intact to observation  Assessment & Plan:

## 2017-12-19 NOTE — Patient Instructions (Signed)
Order- DME [facility]- please change autopap to 5-12, continue mask of choice, humidifier, supplies, Airview  Please call if we can help

## 2017-12-22 NOTE — Assessment & Plan Note (Signed)
With good control of sleep apnea now and improving cardiac function, hopefully he will be able to mobilize and get his weight under better control.

## 2017-12-22 NOTE — Assessment & Plan Note (Signed)
Followed by cardiology.  He reports improving ejection fraction.

## 2017-12-22 NOTE — Assessment & Plan Note (Signed)
He continues to benefit from CPAP with much better sleep.  Download confirms excellent compliance and control with his AutoPap. Plan-continue AutoPap 5-15

## 2018-02-20 ENCOUNTER — Telehealth (HOSPITAL_COMMUNITY): Payer: Self-pay | Admitting: Vascular Surgery

## 2018-02-20 NOTE — Telephone Encounter (Signed)
Left pt message to make new chf appt w/ DB , referred by [place] PA-C, NEXT [facility] not urgent

## 2018-04-28 ENCOUNTER — Emergency Department (HOSPITAL_BASED_OUTPATIENT_CLINIC_OR_DEPARTMENT_OTHER)
Admission: EM | Admit: 2018-04-28 | Discharge: 2018-04-28 | Disposition: A | Discharge status: Home / Self Care | Payer: Medicaid Other | Attending: Emergency Medicine | Admitting: Emergency Medicine

## 2018-04-28 ENCOUNTER — Encounter (HOSPITAL_BASED_OUTPATIENT_CLINIC_OR_DEPARTMENT_OTHER): Payer: Self-pay | Admitting: *Deleted

## 2018-04-28 ENCOUNTER — Other Ambulatory Visit: Payer: Self-pay

## 2018-04-28 DIAGNOSIS — Z794 Long term (current) use of insulin: Secondary | ICD-10-CM | POA: Insufficient documentation

## 2018-04-28 DIAGNOSIS — Y939 Activity, unspecified: Secondary | ICD-10-CM | POA: Insufficient documentation

## 2018-04-28 DIAGNOSIS — E119 Type 2 diabetes mellitus without complications: Secondary | ICD-10-CM | POA: Insufficient documentation

## 2018-04-28 DIAGNOSIS — Y990 Civilian activity done for income or pay: Secondary | ICD-10-CM | POA: Diagnosis not present

## 2018-04-28 DIAGNOSIS — M25461 Effusion, right knee: Secondary | ICD-10-CM | POA: Diagnosis not present

## 2018-04-28 DIAGNOSIS — Y929 Unspecified place or not applicable: Secondary | ICD-10-CM | POA: Diagnosis not present

## 2018-04-28 DIAGNOSIS — X58XXXA Exposure to other specified factors, initial encounter: Secondary | ICD-10-CM | POA: Insufficient documentation

## 2018-04-28 DIAGNOSIS — Z79899 Other long term (current) drug therapy: Secondary | ICD-10-CM | POA: Insufficient documentation

## 2018-04-28 DIAGNOSIS — M25561 Pain in right knee: Secondary | ICD-10-CM | POA: Diagnosis not present

## 2018-04-28 DIAGNOSIS — M25469 Effusion, unspecified knee: Secondary | ICD-10-CM | POA: Diagnosis present

## 2018-04-28 NOTE — ED Triage Notes (Signed)
Pt reports he was working and had to go up and down steps multiple times this week. C/o right knee pain and now has blister over right knee

## 2018-04-28 NOTE — Discharge Instructions (Signed)
Rest - please stay off right leg as much as possible Elevate - elevate leg above level of heart to help with swelling Ibuprofen - take with food. Take up to 3-4 times daily Follow up with orthopedics

## 2018-04-28 NOTE — ED Notes (Signed)
Pt states he had to climb multiple stairs x 10 days ago and experienced left knee pain when waking up on Friday 12/13. He states that he has been using ice and icy hot  For pain and taking ibuprofen. Pt states that a blister appeared yesterday after applying ice for approx. 15 minutes yesterday. He states that the swelling has resolved in his knee but still a 8/10 for pain. He took 400 mg of ibuprofen today at 1300 with decreased pain, but pain has returned. Denies injury.

## 2018-04-28 NOTE — ED Provider Notes (Signed)
[facility] EMERGENCY DEPARTMENT Provider Note   CSN: [id number] Arrival date & time: 04/28/18  1723     History   Chief Complaint Chief Complaint  Patient presents with  . Knee Pain    HPI Dustin Mcclain is a 52 y.o. male who presents with right knee pain and swelling. PMH significant for DM, obesity, CHF, CKD. He states that he was lifting equipment for his job last week and was going up and down stairs. He didn't have an injury but afterwards started to have gradually worsening pain and swelling of the right knee. The pain is over the anterior knee and shoots up over the lateral aspect.  He had difficulty putting weight on it so used crutches. He tried to ice it and left the ice on for too long and when he pulled the ice off it stuck to his skin. Afterwards he developed a blister on the knee. He reports he's had a patella tendon rupture on the left knee but no issues with the right knee.   HPI  Past Medical History:  Diagnosis Date  . Allergic rhinitis, cause unspecified   . Anemia in stage 3 chronic kidney disease [facility])    sees dr Arty Baumgartner every 3 months  . Anxiety state, unspecified   . Asthma    mild  . CHF (congestive heart failure) (HCC)    right side  . Depressive disorder, not elsewhere classified   . Diabetes mellitus without complication ([place])   . GERD (gastroesophageal reflux disease)   . Insomnia, unspecified   . Morbid obesity ([place])   . Other primary cardiomyopathies   . Tendinitis    left wrist and elbow and left knee  . Unspecified disorder resulting from impaired renal function   . Unspecified essential hypertension   . Unspecified sleep apnea    cpap setting of 22 or 23    Patient Active Problem List   Diagnosis Date Noted  . Benign neoplasm of descending colon 04/15/2014  . Right knee pain 08/06/2013  . Joint effusion 08/06/2013  . DM 10/23/2009  . OBESITY, MORBID 07/27/2008  . CHF 07/27/2008  . Seasonal allergic rhinitis  07/27/2008  . Cardiomyopathy ([place]) 07/11/2008  . RENAL INSUFFICIENCY 07/11/2008  . ANXIETY 06/06/2008  . DEPRESSION 06/06/2008  . HYPERTENSION 06/06/2008  . Insomnia 06/06/2008  . Obstructive sleep apnea 06/06/2008    Past Surgical History:  Procedure Laterality Date  . APPENDECTOMY    . COLONOSCOPY WITH PROPOFOL N/A 04/15/2014   Procedure: COLONOSCOPY WITH PROPOFOL;  Surgeon: Irene Shipper, MD;  Location: [facility] ENDOSCOPY;  Service: Endoscopy;  Laterality: N/A;  . PATELLAR TENDON REPAIR     left        Home Medications    Prior to Admission medications   Medication Sig Start Date End Date Taking? Authorizing Provider  atorvastatin (LIPITOR) 10 MG tablet Take 10 mg by mouth daily.   Yes [provider]  albuterol (PROVENTIL HFA;VENTOLIN HFA) 108 (90 BASE) MCG/ACT inhaler Inhale 2 puffs into the lungs every 4 (four) hours as needed for wheezing or shortness of breath.    [provider]  carvedilol (COREG) 25 MG tablet Take 25 mg by mouth 2 (two) times daily with a meal.     [provider]  cholecalciferol (VITAMIN D) 1000 UNITS tablet Take 3,000 Units by mouth every morning.     [provider]  clonazePAM (KLONOPIN) 0.5 MG tablet Take 1 tablet (0.5 mg total) by mouth at  bedtime as needed. 12/17/13   Baird Lyons D, MD  colchicine 0.6 MG tablet Take 0.6 mg by mouth as needed.     [provider]  esomeprazole (NEXIUM) 40 MG capsule Take 40 mg by mouth daily as needed (indigestion.).     [provider]  furosemide (LASIX) 40 MG tablet Take 40 mg by mouth 2 (two) times daily.    [provider]  Insulin Detemir (LEVEMIR FLEXPEN [place]) Inject 60 Units into the skin at bedtime.     [provider]  Liraglutide (VICTOZA [place]) Inject into the skin. 1.2 mg at bedtime    [provider]  Magnesium 250 MG TABS Take 1 tablet by mouth every morning.     [provider]  potassium chloride SA (K-DUR,KLOR-CON)  20 MEQ tablet Take 20 mEq by mouth 2 (two) times daily.    [provider]  sacubitril-valsartan (ENTRESTO) 49-51 MG Take 1 tablet by mouth 2 (two) times daily. 12/19/17   Baird Lyons D, MD  spironolactone (ALDACTONE) 25 MG tablet Take 25 mg by mouth 2 (two) times daily.    [provider]  Testosterone (ANDROGEL) 20.25 MG/1.25GM (1.62%) GEL Place 1 application onto the skin every morning.     [provider]    Family History Family History  Problem Relation Age of Onset  . Heart disease Mother   . Diabetes Mother   . Heart disease Father   . Diabetes Father     Social History Social History   Tobacco Use  . Smoking status: Never Smoker  . Smokeless tobacco: Never Used  Substance Use Topics  . Alcohol use: Yes    Alcohol/week: 1.0 standard drinks    Types: 1 Cans of beer per week    Comment: socially  . Drug use: No     Allergies   Patient has no known allergies.   Review of Systems Review of Systems  Constitutional: Negative for fever.  Musculoskeletal: Positive for arthralgias and joint swelling.     Physical Exam Updated Vital Signs BP (!) 157/80 (BP Location: Right Arm)   Pulse 78   Temp 98.6 F (37 C) (Oral)   Resp 18   Ht 5\' 11"  (1.803 m)   Wt (!) 170.1 kg   SpO2 98%   BMI 52.30 kg/m   Physical Exam Vitals signs and nursing note reviewed.  Constitutional:      General: He is not in acute distress.    Appearance: He is well-developed. He is obese.     Comments: Calm, cooperative, pleasant  HENT:     Head: Normocephalic and atraumatic.  Eyes:     General: No scleral icterus.       Right eye: No discharge.        Left eye: No discharge.     Conjunctiva/sclera: Conjunctivae normal.     Pupils: Pupils are equal, round, and reactive to light.  Neck:     Musculoskeletal: Normal range of motion.  Cardiovascular:     Rate and Rhythm: Normal rate.  Pulmonary:     Effort: Pulmonary effort is normal. No respiratory  distress.  Abdominal:     General: There is no distension.  Musculoskeletal:        General: Swelling present.     Comments: Right knee: Moderate joint effusion. Large fluid filled blister over the lateral knee cap which is intact. Tenderness to palpation of medial joint line. Decreased ROM due to swelling but able to range  without significant pain.   Skin:    General: Skin is warm and dry.  Neurological:     Mental Status: He is alert and oriented to person, place, and time.  Psychiatric:        Behavior: Behavior normal.      ED Treatments / Results  Labs (all labs ordered are listed, but only abnormal results are displayed) Labs Reviewed - No data to display  EKG None  Radiology No results found.  Procedures Procedures (including critical care time)  Medications Ordered in ED Medications - No data to display   Initial Impression / Assessment and Plan / ED Course  I have reviewed the triage vital signs and the nursing notes.  Pertinent labs & imaging results that were available during my care of the patient were reviewed by me and considered in my medical decision making (see chart for details).  52 year old male presents with atraumatic joint swelling and pain. He has a blister which was likely from leaving ice on his knee for too long. Exam is not consistent with septic joint. Suspect meniscus tear. Will place in knee immobilizer and have him f/u with orthopedics. Advised him to not pop the blister.  Final Clinical Impressions(s) / ED Diagnoses   Final diagnoses:  Pain and swelling of right knee    ED Discharge Orders    None       Iris Pert 04/28/18 2035    Isla Pence, MD 04/28/18 2321

## 2018-06-28 ENCOUNTER — Ambulatory Visit (HOSPITAL_COMMUNITY)
Admission: RE | Admit: 2018-06-28 | Discharge: 2018-06-28 | Disposition: A | Discharge status: Home / Self Care | Payer: Medicaid Other | Source: Ambulatory Visit | Attending: Cardiology | Admitting: Cardiology

## 2018-06-28 VITALS — HR 81 | Wt 375.0 lb

## 2018-06-28 DIAGNOSIS — R9431 Abnormal electrocardiogram [ECG] [EKG]: Secondary | ICD-10-CM | POA: Diagnosis not present

## 2018-06-28 DIAGNOSIS — Z6841 Body Mass Index (BMI) 40.0 and over, adult: Secondary | ICD-10-CM | POA: Insufficient documentation

## 2018-06-28 DIAGNOSIS — I429 Cardiomyopathy, unspecified: Secondary | ICD-10-CM | POA: Insufficient documentation

## 2018-06-28 DIAGNOSIS — I5022 Chronic systolic (congestive) heart failure: Secondary | ICD-10-CM | POA: Insufficient documentation

## 2018-06-28 DIAGNOSIS — Z79899 Other long term (current) drug therapy: Secondary | ICD-10-CM | POA: Insufficient documentation

## 2018-06-28 DIAGNOSIS — Z794 Long term (current) use of insulin: Secondary | ICD-10-CM | POA: Insufficient documentation

## 2018-06-28 DIAGNOSIS — E1122 Type 2 diabetes mellitus with diabetic chronic kidney disease: Secondary | ICD-10-CM | POA: Diagnosis not present

## 2018-06-28 DIAGNOSIS — I129 Hypertensive chronic kidney disease with stage 1 through stage 4 chronic kidney disease, or unspecified chronic kidney disease: Secondary | ICD-10-CM | POA: Diagnosis not present

## 2018-06-28 DIAGNOSIS — G4733 Obstructive sleep apnea (adult) (pediatric): Secondary | ICD-10-CM | POA: Diagnosis not present

## 2018-06-28 DIAGNOSIS — E785 Hyperlipidemia, unspecified: Secondary | ICD-10-CM | POA: Diagnosis not present

## 2018-06-28 DIAGNOSIS — Z8249 Family history of ischemic heart disease and other diseases of the circulatory system: Secondary | ICD-10-CM | POA: Insufficient documentation

## 2018-06-28 DIAGNOSIS — I10 Essential (primary) hypertension: Secondary | ICD-10-CM

## 2018-06-28 DIAGNOSIS — N183 Chronic kidney disease, stage 3 (moderate): Secondary | ICD-10-CM | POA: Diagnosis not present

## 2018-06-28 LAB — BASIC METABOLIC PANEL
ANION GAP: 13 (ref 5–15)
BUN: 35 mg/dL — ABNORMAL HIGH (ref 6–20)
CO2: 20 mmol/L — AB (ref 22–32)
Calcium: 9.9 mg/dL (ref 8.9–10.3)
Chloride: 99 mmol/L (ref 98–111)
Creatinine, Ser: 2.07 mg/dL — ABNORMAL HIGH (ref 0.61–1.24)
GFR calc non Af Amer: 36 mL/min — ABNORMAL LOW (ref >60)
GFR, EST AFRICAN AMERICAN: 42 mL/min — AB (ref >60)
GLUCOSE: 272 mg/dL — AB (ref 70–99)
Potassium: 4.8 mmol/L (ref 3.5–5.1)
Sodium: 132 mmol/L — ABNORMAL LOW (ref 135–145)

## 2018-06-28 LAB — CBC
HCT: 39.8 % (ref 39.0–52.0)
Hemoglobin: 12 g/dL — ABNORMAL LOW (ref 13.0–17.0)
MCH: 21.9 pg — ABNORMAL LOW (ref 26.0–34.0)
MCHC: 30.2 g/dL (ref 30.0–36.0)
MCV: 72.5 fL — ABNORMAL LOW (ref 80.0–100.0)
Platelets: 301 K/uL (ref 150–400)
RBC: 5.49 MIL/uL (ref 4.22–5.81)
RDW: 15.2 % (ref 11.5–15.5)
WBC: 10.2 K/uL (ref 4.0–10.5)
nRBC: 0 % (ref 0.0–0.2)

## 2018-06-28 LAB — LIPID PANEL
Cholesterol: 182 mg/dL (ref 0–200)
HDL: 31 mg/dL — ABNORMAL LOW (ref >40)
LDL Cholesterol: UNDETERMINED mg/dL (ref 0–99)
Total CHOL/HDL Ratio: 5.9 RATIO
Triglycerides: 468 mg/dL — ABNORMAL HIGH (ref <150)
VLDL: UNDETERMINED mg/dL (ref 0–40)

## 2018-06-28 MED ORDER — HYDRALAZINE HCL 50 MG PO TABS: 50.0000 mg | ORAL_TABLET | Freq: Two times a day (BID) | ORAL | 6 refills | Status: DC

## 2018-06-28 MED ORDER — ISOSORBIDE MONONITRATE ER 30 MG PO TB24: 30.0000 mg | ORAL_TABLET | Freq: Every day | ORAL | 3 refills | Status: DC

## 2018-06-28 NOTE — Patient Instructions (Addendum)
INCREASE Hydralazine to 50mg  (1 tab) twice a day  START Imdur 30mg  (1 tab) daily  Labs today We will only contact you if something comes back abnormal or we need to make some changes. Otherwise no news is good news!  You have been referred to [facility].  They will call you to schedule this appointment.  Your physician has requested that you have an echocardiogram. Echocardiography is a painless test that uses sound waves to create images of your heart. It provides your doctor with information about the size and shape of your heart and how well your heart's chambers and valves are working. This procedure takes approximately one hour. There are no restrictions for this procedure.  Your physician recommends that you schedule a follow-up appointment in: 1 month with Dr. Aundra Dubin and an ECHO

## 2018-06-28 NOTE — Progress Notes (Signed)
PCP: Dr. Glendon Axe HF Cardiology: Dr. Aundra Dubin  53 yo with CKD stage 3, DM, HTN, and chronic systolic CHF was referred by Dr. Tamala Julian at [facility] for evaluation of CHF. Patient was diagnosed with a cardiomyopathy as far back as 2007.  His mother and father both have CHF and have ICDs.  Uncle had a heart transplant.  He has had long-standing HTN and diabetes.  We have record of a Cardiolite in 2017 with EF 25%, no ischemia and an echo in 2017 with EF 35-40%.  He says that with better blood pressure control, his EF returned to normal range on echo he had about a year ago (we do not have record of this echo).  He was on Entresto in the past and felt like it helped his symptoms, but he has been off it for several weeks as his insurance is not covering it. BP today is controlled.   Generally, he is feeling good.  No dyspnea walking on flat ground, he can walk up to 2 miles without trouble.  No dyspnea walking up stairs but he gets knee pain.  Struggling with his weight, he has been unable to lose.  No orthopnea/PND.  Uses CPAP. Sees an endocrinologist for diabetes.   Labs (3/17): K 4.2, creatinine 1.57  ECG: NSR, left axis deviation  PMH: 1. OSA: Uses CPAP.  2. Type 2 diabetes 3. Morbid obesity 4. Hyperlipidemia 5. CKD stage 3: Sees Dr. Marval Regal.  6. Cardiomyopathy: Diagnosed in 2007. Presumed nonischemic, possibly related to long-standing HTN versus familial cardiomyopathy.  - Cardiolite in 2017: EF 25%, no ischemia.  - Echo (2017): EF 35-40%.  7. HTN  Social History   Socioeconomic History  . Marital status: Divorced    Spouse name: Not on file  . Number of children: Not on file  . Years of education: Not on file  . Highest education level: Not on file  Occupational History  . Not on file  Social Needs  . Financial resource strain: Not on file  . Food insecurity:    Worry: Not on file    Inability: Not on file  . Transportation needs:    Medical: Not on file   Non-medical: Not on file  Tobacco Use  . Smoking status: Never Smoker  . Smokeless tobacco: Never Used  Substance and Sexual Activity  . Alcohol use: Yes    Alcohol/week: 1.0 standard drinks    Types: 1 Cans of beer per week    Comment: socially  . Drug use: No  . Sexual activity: Not on file  Lifestyle  . Physical activity:    Days per week: Not on file    Minutes per session: Not on file  . Stress: Not on file  Relationships  . Social connections:    Talks on phone: Not on file    Gets together: Not on file    Attends religious service: Not on file    Active member of club or organization: Not on file    Attends meetings of clubs or organizations: Not on file    Relationship status: Not on file  . Intimate partner violence:    Fear of current or ex partner: Not on file    Emotionally abused: Not on file    Physically abused: Not on file    Forced sexual activity: Not on file  Other Topics Concern  . Not on file  Social History Narrative  . Not on file   FH: Uncle  with heart transplant, mother with CHF/ICD, father with CHF/ICD  ROS: All systems reviewed and negative except as per HPI.   Current Outpatient Medications  Medication Sig Dispense Refill  . albuterol (PROVENTIL HFA;VENTOLIN HFA) 108 (90 BASE) MCG/ACT inhaler Inhale 2 puffs into the lungs every 4 (four) hours as needed for wheezing or shortness of breath.    Marland Kitchen atorvastatin (LIPITOR) 10 MG tablet Take 10 mg by mouth daily.    . carvedilol (COREG) 25 MG tablet Take 25 mg by mouth 2 (two) times daily with a meal.     . cholecalciferol (VITAMIN D) 1000 UNITS tablet Take 3,000 Units by mouth every morning.     . colchicine 0.6 MG tablet Take 0.6 mg by mouth as needed.     Marland Kitchen esomeprazole (NEXIUM) 40 MG capsule Take 40 mg by mouth daily as needed (indigestion.).     Marland Kitchen furosemide (LASIX) 40 MG tablet Take 40 mg by mouth 2 (two) times daily.    . hydrALAZINE (APRESOLINE) 50 MG tablet Take 1 tablet (50 mg total) by  mouth 2 (two) times daily. 60 tablet 6  . Insulin Detemir (LEVEMIR FLEXPEN [place]) Inject 60 Units into the skin at bedtime.     . Liraglutide (VICTOZA ) Inject into the skin. 1.2 mg at bedtime    . Magnesium 250 MG TABS Take 1 tablet by mouth every morning.     . potassium chloride SA (K-DUR,KLOR-CON) 20 MEQ tablet Take 20 mEq by mouth 2 (two) times daily.    Marland Kitchen spironolactone (ALDACTONE) 25 MG tablet Take 25 mg by mouth 2 (two) times daily.    . Testosterone (ANDROGEL) 20.25 MG/1.25GM (1.62%) GEL Place 1 application onto the skin every morning.     . isosorbide mononitrate (IMDUR) 30 MG 24 hr tablet Take 1 tablet (30 mg total) by mouth daily. 90 tablet 3   No current facility-administered medications for this encounter.    Pulse 81   Wt (!) 170.1 kg (375 lb)   SpO2 98%   BMI 52.30 kg/m  General: NAD, obese Neck: No JVD, no thyromegaly or thyroid nodule.  Lungs: Clear to auscultation bilaterally with normal respiratory effort. CV: Nondisplaced PMI.  Heart regular S1/S2, no S3/S4, no murmur.  No peripheral edema.  No carotid bruit.  Normal pedal pulses.  Abdomen: Soft, nontender, no hepatosplenomegaly, no distention.  Skin: Intact without lesions or rashes.  Neurologic: Alert and oriented x 3.  Psych: Normal affect. Extremities: No clubbing or cyanosis.  HEENT: Normal.   Assessment/Plan: 1. Cardiomyopathy/chronic systolic CHF: Uncertain etiology but suspect related to uncontrolled hypertension vs diabetes vs a familial cardiomyopathy given extensive family history.  He had a Cardiolite in 2017 that did not show ischemia.  Last echo that I have record of was from 2017 with EF 35-40%.  However, he says he had an echo 1 year ago with normalized EF.  If EF remains improved, CMP may be due to hypertension with improvement after controlling hypertension.  On exam, he is not volume overloaded. NYHA class II symptoms.  - Continue Coreg 25 mg bid and spironolactone 25 mg bid.  - I will not start  Entresto back at this time until I have seen his EF and renal function. He needs a BMET today and I will schedule an echo.  - Increase hydralazine to 50 mg bid (cannot remember to take tid) and add Imdur 30 mg daily.  2. HTN: BP upper normal range today. As above, will increase hydralazine.  3. Type II diabetes: Consider use of Farxiga or Jardiance in the future.  Also, given diabetes, I will check lipids with low threshold to treat with statin.  4. Morbid obesity: He has had a lot of trouble losing weight.  - I will refer to the  weight management program with Dr. Leafy Ro.  5. OSA: Continue CPAP.  6. CKD: Stage 3.  Check BMET today.   Loralie Champagne 06/28/2018

## 2018-06-29 ENCOUNTER — Telehealth (HOSPITAL_COMMUNITY): Payer: Self-pay

## 2018-06-29 DIAGNOSIS — I5022 Chronic systolic (congestive) heart failure: Secondary | ICD-10-CM

## 2018-06-29 DIAGNOSIS — E7800 Pure hypercholesterolemia, unspecified: Secondary | ICD-10-CM

## 2018-06-29 MED ORDER — ICOSAPENT ETHYL 1 G PO CAPS: 2.0000 g | ORAL_CAPSULE | Freq: Two times a day (BID) | ORAL | 6 refills | Status: DC

## 2018-06-29 MED ORDER — FUROSEMIDE 40 MG PO TABS: ORAL_TABLET | ORAL | 6 refills | Status: DC

## 2018-06-29 NOTE — Telephone Encounter (Signed)
-----   Message from Larey Dresser, MD sent at 06/29/2018  9:43 AM EST ----- Creatinine high.  Decrease Lasix to 40 qam/20 qpm.  Triglycerides high, would recommend starting Vascepa 2 g bid with lipids in 2 months.

## 2018-06-29 NOTE — Telephone Encounter (Signed)
Creatinine 2.07 Triglycerides 468  Pt aware of results of creatinine and cholesteroland recommendations to decrease lasix 40mg  every morning and 20mg  every evening. States understanding. Lab appt made for 2 months and appt card mailed.

## 2018-07-27 ENCOUNTER — Encounter (HOSPITAL_COMMUNITY): Payer: Medicaid Other | Admitting: Cardiology

## 2018-07-27 ENCOUNTER — Ambulatory Visit (HOSPITAL_COMMUNITY): Payer: Medicaid Other

## 2018-07-31 ENCOUNTER — Other Ambulatory Visit (HOSPITAL_COMMUNITY): Payer: Self-pay

## 2018-07-31 MED ORDER — ICOSAPENT ETHYL 1 G PO CAPS: 2.0000 g | ORAL_CAPSULE | Freq: Two times a day (BID) | ORAL | 6 refills | Status: DC

## 2018-08-02 ENCOUNTER — Telehealth (HOSPITAL_COMMUNITY): Payer: Self-pay | Admitting: Cardiology

## 2018-08-02 NOTE — Telephone Encounter (Signed)
PA REQUEST RECEIVED FOR ENTRESTO PA COMPLETED VIA NCTRAX PA # [id number] VAILID 08/02/18-3/36/21

## 2018-08-28 ENCOUNTER — Other Ambulatory Visit (HOSPITAL_COMMUNITY): Payer: Medicaid Other

## 2018-09-26 ENCOUNTER — Telehealth (HOSPITAL_COMMUNITY): Payer: Self-pay

## 2018-09-26 NOTE — Telephone Encounter (Signed)

## 2018-09-27 ENCOUNTER — Other Ambulatory Visit: Payer: Self-pay

## 2018-09-27 ENCOUNTER — Telehealth (HOSPITAL_COMMUNITY): Payer: Self-pay

## 2018-09-27 ENCOUNTER — Ambulatory Visit (HOSPITAL_COMMUNITY)
Admission: RE | Admit: 2018-09-27 | Discharge: 2018-09-27 | Disposition: A | Discharge status: Home / Self Care | Payer: Medicaid Other | Source: Ambulatory Visit | Attending: Cardiology | Admitting: Cardiology

## 2018-09-27 DIAGNOSIS — E7800 Pure hypercholesterolemia, unspecified: Secondary | ICD-10-CM | POA: Diagnosis present

## 2018-09-27 DIAGNOSIS — I5032 Chronic diastolic (congestive) heart failure: Secondary | ICD-10-CM

## 2018-09-27 LAB — LIPID PANEL
Cholesterol: 191 mg/dL (ref 0–200)
HDL: 34 mg/dL — ABNORMAL LOW (ref >40)
LDL Cholesterol: 118 mg/dL — ABNORMAL HIGH (ref 0–99)
Total CHOL/HDL Ratio: 5.6 RATIO
Triglycerides: 193 mg/dL — ABNORMAL HIGH (ref <150)
VLDL: 39 mg/dL (ref 0–40)

## 2018-09-27 MED ORDER — ROSUVASTATIN CALCIUM 5 MG PO TABS: 5.0000 mg | ORAL_TABLET | Freq: Every day | ORAL | 3 refills | Status: DC

## 2018-09-27 NOTE — Telephone Encounter (Signed)
Left VM to call back 

## 2018-09-27 NOTE — Telephone Encounter (Signed)
-----   Message from Larey Dresser, MD sent at 09/27/2018  3:46 PM EDT ----- Triglycerides are better.  However, LDL 118.  Would like to see LDL at least < 100 with diabetes.  Start Crestor 5 mg daily.  Lipids/LFTs in 2 months.

## 2018-09-27 NOTE — Telephone Encounter (Signed)
Pt aware of lab resuts. Will start crestor 5mg  daily, repeat labs in 2 months. Lab appt made and reminder card mailed

## 2018-11-27 ENCOUNTER — Other Ambulatory Visit (HOSPITAL_COMMUNITY): Payer: Medicaid Other

## 2018-12-18 ENCOUNTER — Encounter: Payer: Self-pay | Admitting: Internal Medicine

## 2018-12-20 ENCOUNTER — Encounter: Payer: Self-pay | Admitting: Internal Medicine

## 2018-12-20 ENCOUNTER — Other Ambulatory Visit: Payer: Self-pay

## 2018-12-20 ENCOUNTER — Ambulatory Visit: Payer: Medicaid Other | Admitting: Internal Medicine

## 2018-12-20 DIAGNOSIS — I429 Cardiomyopathy, unspecified: Secondary | ICD-10-CM | POA: Diagnosis not present

## 2018-12-20 DIAGNOSIS — G4733 Obstructive sleep apnea (adult) (pediatric): Secondary | ICD-10-CM | POA: Diagnosis not present

## 2018-12-20 NOTE — Patient Instructions (Signed)
We can continue CPAP auto 5-12, mask of choice, humidifier, supplies, AirView/ card  Please keep working to get your weight down- that will help your heart and your breathing  Please call if we can help

## 2018-12-20 NOTE — Progress Notes (Signed)
Subjective:    Patient ID: Dustin Mcclain, male    DOB: 12-27-1965, 53 y.o.   MRN: [id number]  HPI male never smoker followed for OSA/insomnia, complicated by obesity, HBP, cardiomyopathy, renal insufficiency, DM NPSG 08/15/16   AHI 10.8/ hr, desaturation to 83%, body weight 365 lbs   .------------------------------------------------------------------------------------- 12/19/2017- 53 year old male never smoker followed for OSA/insomnia, complicated by obesity, HBP, Cardiomyopathy/CHF, renal insufficiency, DM, GERD, CPAP auto 5-15/[facility] -----OSA; DME: [facility] Pt wears CPAP nightly and DL attached. No new supplies needed at this time.  Weight today 373 pounds Clonazepam 0.5 mg He says he gets "best sleep" with CPAP.  Download 100% compliance AHI 0.9/hour. He reports better cardiac output and improving cardiomyopathy.  12/20/2018- 53 year old male never smoker followed for OSA/insomnia, complicated by obesity, HBP, Cardiomyopathy/CHF, renal insufficiency, DM, GERD, CPAP auto 5-12/[facility] Download compliance 97%, AHI 1.5/ hr -----OSA on CPAP auto 5-12, DME: [facility]; no complaints He feels comfortable with his CPAP. Reviewed download. Not working now. Denies cough or wheeze. Cardiology follows for his cardiomyopathy and he understands he is stable.  ROS-see HPI    + = positive Constitutional:   No-   weight loss, night sweats, fevers, chills, fatigue, lassitude. HEENT:   No-  headaches, difficulty swallowing, tooth/dental problems, sore throat,       No-  sneezing, itching, ear ache, nasal congestion, post nasal drip,  CV:  No-   chest pain, orthopnea, PND, swelling in lower extremities, anasarca,  dizziness, palpitations Resp: No-   shortness of breath with exertion or at rest.              No-   productive cough,  No non-productive cough,  No- coughing up of blood.              No-   change in color of mucus.  No- wheezing.   Skin: No-   rash or lesions. GI:  No-   heartburn, indigestion,  abdominal pain, nausea, vomiting,  GU: . MS:  No-   joint pain or swelling.  Neuro-     nothing unusual Psych:  No- change in mood or affect. No depression or anxiety.  No memory loss.  Objective:   Physical Exam General- Alert, Oriented, Affect-appropriate, Distress- none acute   +morbidly obese Skin- rash-none, lesions- none, excoriation- none Lymphadenopathy- none Head- atraumatic            Eyes- Gross vision intact, PERRLA, conjunctivae clear secretions            Ears- Hearing, canals normal            Nose- Clear, no- Septal dev, mucus, polyps, erosion, perforation             Throat- Mallampati III-IV , mucosa clear , drainage- none, tonsils- atrophic Neck- flexible , trachea midline, no stridor , thyroid nl, carotid no bruit Chest - symmetrical excursion , unlabored           Heart/CV- RRR , no murmur , no gallop  , no rub, nl s1 s2                           - JVD- none , edema- none, stasis changes- none, varices- none           Lung- clear to P&A, wheeze- none, cough- none , dullness-none, rub- none           Chest wall-  Abd-  Br/ Gen/ Rectal- Not  done, not indicated Extrem- cyanosis- none, clubbing, none, atrophy- none, strength- nl Neuro- grossly intact to observation  Assessment & Plan:

## 2018-12-22 NOTE — Assessment & Plan Note (Signed)
He has not made the life style changes need to accomplish meaningful weight loss. Suggest contact [facility] program.

## 2018-12-22 NOTE — Assessment & Plan Note (Signed)
Followed by Cardiology 

## 2018-12-22 NOTE — Assessment & Plan Note (Signed)
He continues to benefit from CPAP with good compliance and control Plan- continue auto 5-12

## 2019-03-14 ENCOUNTER — Telehealth: Payer: Self-pay

## 2019-03-14 NOTE — Telephone Encounter (Signed)
NOTES ON FILE FROM [facility] [phone], SENT REFERRAL TO SCHEDULING 

## 2019-03-20 ENCOUNTER — Other Ambulatory Visit: Payer: Self-pay

## 2019-03-20 ENCOUNTER — Encounter: Payer: Self-pay | Admitting: *Deleted

## 2019-03-20 ENCOUNTER — Ambulatory Visit (INDEPENDENT_AMBULATORY_CARE_PROVIDER_SITE_OTHER): Payer: Medicaid Other | Admitting: Cardiology

## 2019-03-20 VITALS — BP 142/104 | HR 79 | Ht 71.0 in | Wt 374.0 lb

## 2019-03-20 DIAGNOSIS — Z9989 Dependence on other enabling machines and devices: Secondary | ICD-10-CM

## 2019-03-20 DIAGNOSIS — I420 Dilated cardiomyopathy: Secondary | ICD-10-CM

## 2019-03-20 DIAGNOSIS — I5022 Chronic systolic (congestive) heart failure: Secondary | ICD-10-CM

## 2019-03-20 DIAGNOSIS — E782 Mixed hyperlipidemia: Secondary | ICD-10-CM

## 2019-03-20 DIAGNOSIS — I10 Essential (primary) hypertension: Secondary | ICD-10-CM

## 2019-03-20 DIAGNOSIS — G4733 Obstructive sleep apnea (adult) (pediatric): Secondary | ICD-10-CM

## 2019-03-20 MED ORDER — HYDRALAZINE HCL 50 MG PO TABS: ORAL_TABLET | ORAL | 6 refills | Status: DC

## 2019-03-20 NOTE — Progress Notes (Signed)
Cardiology Office Note:    Date:  03/20/2019   ID:  Dustin Mcclain, DOB 10-Nov-1965, MRN [id number]  PCP:  Glendon Axe, MD  Cardiologist:  No primary care provider on file.  Electrophysiologist:  None   Referring MD: Glendon Axe, MD   Chief Complaint  Patient presents with  . Establish Care    History of Present Illness:    Dustin Mcclain is a 53 y.o. male with a hx of hypertension, CKD stage 3, hyperlipidemia, chronic systolic heart failure, Dilated CM with a recent EF in 2017 was 35-40%. He did see Dr. Aundra Dubin in feb 06/2018 a that time patient was off his entresto and his blood pressure was reported as acceptable.  He states that he has been doing well on the current regimen with keep his blood pressure close to 130/53mmHg as possible. But recently no matter what he did his blood pressure is now getting higher. As a result he decided to schedule a cardiology visit.   Today he denies chest pain, shortness of breath or lightheadedness or dizziness.   Past Medical History:  Diagnosis Date  . Allergic rhinitis, cause unspecified   . Anemia in stage 3 chronic kidney disease    sees dr Arty Baumgartner every 3 months  . Anxiety state, unspecified   . Asthma    mild  . Benign neoplasm of descending colon 04/15/2014  . Cardiomyopathy ([place]) 07/11/2008   Qualifier: Diagnosis of  By: Annamaria Boots MD, Clinton D   . CHF 07/27/2008   Qualifier: Diagnosis of  By: Annamaria Boots MD, Clinton D   . CHF (congestive heart failure) ([place])    right side  . Chronic systolic congestive heart failure ([place]) 10/22/2015  . Depressive disorder, not elsewhere classified   . Diabetes mellitus without complication ([place])   . DM 10/23/2009   Qualifier: Diagnosis of  By: Annamaria Boots MD, Clinton D   . GERD (gastroesophageal reflux disease)   . Insomnia 06/06/2008   Qualifier: Diagnosis of  By: Lockie Pares CMA, [name]    . Insomnia, unspecified   . Joint effusion 08/06/2013  . Mixed hyperlipidemia 10/03/2017  . Morbid obesity ([place])   .  Obstructive sleep apnea 06/06/2008   NPSG 08/15/16   AHI 10.8/ hr, desaturation to 83%, body weight 365 lbs- requalified for CPAP   . Other primary cardiomyopathies   . Right knee pain 08/06/2013  . Seasonal allergic rhinitis 07/27/2008   Symptom patterns suggests primary sensitivity to spring grass pollens. Environmental precautions discussed. Potential candidate for sublingual therapy.    . Shortness of breath 10/19/2015  . Tendinitis    left wrist and elbow and left knee  . Unspecified disorder resulting from impaired renal function   . Unspecified essential hypertension   . Unspecified sleep apnea    cpap setting of 22 or 23    Past Surgical History:  Procedure Laterality Date  . APPENDECTOMY    . COLONOSCOPY WITH PROPOFOL N/A 04/15/2014   Procedure: COLONOSCOPY WITH PROPOFOL;  Surgeon: Irene Shipper, MD;  Location: [facility];  Service: Endoscopy;  Laterality: N/A;  . PATELLAR TENDON REPAIR     left    Current Medications: Current Meds  Medication Sig  . albuterol (PROVENTIL HFA;VENTOLIN HFA) 108 (90 BASE) MCG/ACT inhaler Inhale 2 puffs into the lungs every 4 (four) hours as needed for wheezing or shortness of breath.  Marland Kitchen atorvastatin (LIPITOR) 10 MG tablet Take 10 mg by mouth daily.  . carvedilol (COREG) 25 MG tablet Take 25 mg by  mouth 2 (two) times daily with a meal.   . cholecalciferol (VITAMIN D) 1000 UNITS tablet Take 3,000 Units by mouth every morning.   . colchicine 0.6 MG tablet Take 0.6 mg by mouth as needed.   . furosemide (LASIX) 40 MG tablet Take 1 tablet (40 mg total) by mouth every morning AND 0.5 tablets (20 mg total) every evening.  . hydrALAZINE (APRESOLINE) 50 MG tablet Take 1 tablet (50mg ) every 8 hours  . Insulin Detemir (LEVEMIR FLEXPEN [place]) Inject 60 Units into the skin at bedtime.   . Liraglutide (VICTOZA [place]) Inject into the skin. 1.2 mg at bedtime  . Magnesium 250 MG TABS Take 1 tablet by mouth every morning.   . potassium chloride SA (K-DUR,KLOR-CON) 20 MEQ  tablet Take 20 mEq by mouth 2 (two) times daily.  Marland Kitchen spironolactone (ALDACTONE) 25 MG tablet Take 25 mg by mouth 2 (two) times daily.  . Testosterone (ANDROGEL) 20.25 MG/1.25GM (1.62%) GEL Place 1 application onto the skin every morning.   . [DISCONTINUED] hydrALAZINE (APRESOLINE) 50 MG tablet Take 1 tablet (50 mg total) by mouth 2 (two) times daily.     Allergies:   Patient has no known allergies.   Social History   Socioeconomic History  . Marital status: Divorced    Spouse name: Not on file  . Number of children: Not on file  . Years of education: Not on file  . Highest education level: Not on file  Occupational History  . Not on file  Social Needs  . Financial resource strain: Not on file  . Food insecurity    Worry: Not on file    Inability: Not on file  . Transportation needs    Medical: Not on file    Non-medical: Not on file  Tobacco Use  . Smoking status: Never Smoker  . Smokeless tobacco: Never Used  Substance and Sexual Activity  . Alcohol use: Yes    Alcohol/week: 1.0 standard drinks    Types: 1 Cans of beer per week    Comment: socially  . Drug use: No  . Sexual activity: Not on file  Lifestyle  . Physical activity    Days per week: Not on file    Minutes per session: Not on file  . Stress: Not on file  Relationships  . Social Herbalist on phone: Not on file    Gets together: Not on file    Attends religious service: Not on file    Active member of club or organization: Not on file    Attends meetings of clubs or organizations: Not on file    Relationship status: Not on file  Other Topics Concern  . Not on file  Social History Narrative  . Not on file     Family History: The patient's family history includes Diabetes in his father and mother; Heart disease in his father and mother.  ROS:   Review of Systems  Constitution: Negative for decreased appetite, fever and weight gain.  HENT: Negative for congestion, ear discharge, hoarse  voice and sore throat.   Eyes: Negative for discharge, redness, vision loss in right eye and visual halos.  Cardiovascular: Negative for chest pain, dyspnea on exertion, leg swelling, orthopnea and palpitations.  Respiratory: Negative for cough, hemoptysis, shortness of breath and snoring.   Endocrine: Negative for heat intolerance and polyphagia.  Hematologic/Lymphatic: Negative for bleeding problem. Does not bruise/bleed easily.  Skin: Negative for flushing, nail changes, rash and suspicious lesions.  Musculoskeletal: Negative for arthritis, joint pain, muscle cramps, myalgias, neck pain and stiffness.  Gastrointestinal: Negative for abdominal pain, bowel incontinence, diarrhea and excessive appetite.  Genitourinary: Negative for decreased libido, genital sores and incomplete emptying.  Neurological: Negative for brief paralysis, focal weakness, headaches and loss of balance.  Psychiatric/Behavioral: Negative for altered mental status, depression and suicidal ideas.  Allergic/Immunologic: Negative for HIV exposure and persistent infections.    EKGs/Labs/Other Studies Reviewed:    The following studies were reviewed today:   EKG:  The ekg ordered today demonstrates sinus rhythm, heart 75 bpm compared to EKG done on June 10, 2018 no significant change.  Recent Labs: 06/28/2018: BUN 35; Creatinine, Ser 2.07; Hemoglobin 12.0; Platelets 301; Potassium 4.8; Sodium 132  Recent Lipid Panel    Component Value Date/Time   CHOL 191 09/27/2018 1010   TRIG 193 (H) 09/27/2018 1010   HDL 34 (L) 09/27/2018 1010   CHOLHDL 5.6 09/27/2018 1010   VLDL 39 09/27/2018 1010   LDLCALC 118 (H) 09/27/2018 1010    Physical Exam:    VS:  BP (!) 142/104 (BP Location: Left Arm, Patient Position: Sitting, Cuff Size: Large)   Pulse 79   Ht 5\' 11"  (1.803 m)   Wt (!) 374 lb (169.6 kg)   SpO2 98%   BMI 52.16 kg/m     Wt Readings from Last 3 Encounters:  03/20/19 (!) 374 lb (169.6 kg)  12/20/18 (!) 368  lb 3.2 oz (167 kg)  06/28/18 (!) 375 lb (170.1 kg)     GEN: Well nourished, well developed in no acute distress HEENT: Normal NECK: No JVD; No carotid bruits LYMPHATICS: No lymphadenopathy CARDIAC: S1S2 noted,RRR, no murmurs, rubs, gallops RESPIRATORY:  Clear to auscultation without rales, wheezing or rhonchi  ABDOMEN: Soft, non-tender, non-distended, +bowel sounds, no guarding. EXTREMITIES: No edema, No cyanosis, no clubbing MUSCULOSKELETAL:  No edema; No deformity  SKIN: Warm and dry NEUROLOGIC:  Alert and oriented x 3, non-focal PSYCHIATRIC:  Normal affect, good insight  ASSESSMENT:    1. Accelerated hypertension   2. Dilated cardiomyopathy ([place])   3. Mixed hyperlipidemia   4. Chronic systolic (congestive) heart failure (H[facility])   5. OSA on CPAP   6. Morbid obesity ([place])    PLAN:    1.  Accelerated hypertension-the patient is hypertensive in the office today with a manual blood pressure of 160/104 mmHg.  I reviewed his medication he is currently on carvedilol 25 mg twice daily, furosemide 40 mg in the morning and 20 mg at night, hydralazine 10 mg twice daily, isosorbide mono nitrate 30 mg daily, spironolactone 25 mg daily.  At this time I am going to crease his hydralazine from 50 mg twice a day to 50 mg every 8 hours.  He is not on Entresto at this time due to cost. I will not restart this medication today.  I will get blood blood work today to assess his kidney function. If his cr will allow it will be ideal to restart entresto or an ARB.  2.  Chronic systolic heart failure-repeat an echocardiogram today.  His last echo was in 2017 which reports a EF of 35 to 40%. He is euvolemic today and takes his lasix as prescribed. After his echo if still with systolic heart failure, I will discuss the use of farxiga with him.  3.  Chronic kidney disease stage III-he follows with nephrology.  4.  Obstructive sleep apnea he admits to being compliant with his CPAP.  5.  Morbid  obesity-the  patient understands the need to lose weight with diet and exercise. We have discussed specific strategies for this.  6.  Hyperlipidemia the patient tells me that he was intolerant to atorvastatin his PCP has started him on pravastatin but he does not know the doses.  7. DM - continue on current medication regimen per pcp.   The patient is in agreement with the above plan. The patient left the office in stable condition.  The patient will follow up in 1 month.   Medication Adjustments/Labs and Tests Ordered: Current medicines are reviewed at length with the patient today.  Concerns regarding medicines are outlined above.  Orders Placed This Encounter  Procedures  . Basic Metabolic Panel (BMET)  . Magnesium  . EKG 12-Lead  . ECHOCARDIOGRAM COMPLETE   Meds ordered this encounter  Medications  . hydrALAZINE (APRESOLINE) 50 MG tablet    Sig: Take 1 tablet (50mg ) every 8 hours    Dispense:  90 tablet    Refill:  6    Please cancel all previous orders for current medication. Change in dosage or pill size.    Patient Instructions  Medication Instructions:  Your physician has recommended you make the following change in your medication:  CHANGE HYDRALAZINE TO 50MG  EVERY 8 HOURS  *If you need a refill on your cardiac medications before your next appointment, please call your pharmacy*  Lab Work: Your physician recommends that you return for lab work Today: BMET Magnesium  If you have labs (blood work) drawn today and your tests are completely normal, you will receive your results only by: Marland Kitchen MyChart Message (if you have MyChart) OR . A paper copy in the mail If you have any lab test that is abnormal or we need to change your treatment, we will call you to review the results.  Testing/Procedures: Echocardiogram Your physician has requested that you have an echocardiogram. Echocardiography is a painless test that uses sound waves to create images of your heart. It provides your  doctor with information about the size and shape of your heart and how well your heart's chambers and valves are working. This procedure takes approximately one hour. There are no restrictions for this procedure.   Follow-Up: At [facility], you and your health needs are our priority.  As part of our continuing mission to provide you with exceptional heart care, we have created designated Provider Care Teams.  These Care Teams include your primary Cardiologist (physician) and Advanced Practice Providers (APPs -  Physician Assistants and Nurse Practitioners) who all work together to provide you with the care you need, when you need it.  Your next appointment:   1 month  The format for your next appointment:   In Person  Provider:   You may see Dr. Harriet Masson  or the following Advanced Practice Provider on your designated Care Team:    Laurann Montana, FNP   Echocardiogram An echocardiogram is a procedure that uses painless sound waves (ultrasound) to produce an image of the heart. Images from an echocardiogram can provide important information about:  Signs of coronary artery disease (CAD).  Aneurysm detection. An aneurysm is a weak or damaged part of an artery wall that bulges out from the normal force of blood pumping through the body.  Heart size and shape. Changes in the size or shape of the heart can be associated with certain conditions, including heart failure, aneurysm, and CAD.  Heart muscle function.  Heart valve function.  Signs of a  past heart attack.  Fluid buildup around the heart.  Thickening of the heart muscle.  A tumor or infectious growth around the heart valves. Tell a health care provider about:  Any allergies you have.  All medicines you are taking, including vitamins, herbs, eye drops, creams, and over-the-counter medicines.  Any blood disorders you have.  Any surgeries you have had.  Any medical conditions you have.  Whether you are pregnant or may  be pregnant. What are the risks? Generally, this is a safe procedure. However, problems may occur, including:  Allergic reaction to dye (contrast) that may be used during the procedure. What happens before the procedure? No specific preparation is needed. You may eat and drink normally. What happens during the procedure?   An IV tube may be inserted into one of your veins.  You may receive contrast through this tube. A contrast is an injection that improves the quality of the pictures from your heart.  A gel will be applied to your chest.  A wand-like tool (transducer) will be moved over your chest. The gel will help to transmit the sound waves from the transducer.  The sound waves will harmlessly bounce off of your heart to allow the heart images to be captured in real-time motion. The images will be recorded on a computer. The procedure may vary among health care providers and hospitals. What happens after the procedure?  You may return to your normal, everyday life, including diet, activities, and medicines, unless your health care provider tells you not to do that. Summary  An echocardiogram is a procedure that uses painless sound waves (ultrasound) to produce an image of the heart.  Images from an echocardiogram can provide important information about the size and shape of your heart, heart muscle function, heart valve function, and fluid buildup around your heart.  You do not need to do anything to prepare before this procedure. You may eat and drink normally.  After the echocardiogram is completed, you may return to your normal, everyday life, unless your health care provider tells you not to do that. This information is not intended to replace advice given to you by your health care provider. Make sure you discuss any questions you have with your health care provider. Document Released: 04/22/2000 Document Revised: 08/16/2018 Document Reviewed: 05/28/2016 Elsevier Patient  Education  2020 [employer].        Adopting a Healthy Lifestyle.  Know what a healthy weight is for you (roughly BMI <25) and aim to maintain this   Aim for 7+ servings of fruits and vegetables daily   65-80+ fluid ounces of water or unsweet tea for healthy kidneys   Limit to max 1 drink of alcohol per day; avoid smoking/tobacco   Limit animal fats in diet for cholesterol and heart health - choose grass fed whenever available   Avoid highly processed foods, and foods high in saturated/trans fats   Aim for low stress - take time to unwind and care for your mental health   Aim for 150 min of moderate intensity exercise weekly for heart health, and weights twice weekly for bone health   Aim for 7-9 hours of sleep daily   When it comes to diets, agreement about the perfect plan isnt easy to find, even among the experts. Experts at the [employer] developed an idea known as the Healthy Eating Plate. Just imagine a plate divided into logical, healthy portions.   The emphasis is on diet  quality:   Load up on vegetables and fruits - one-half of your plate: Aim for color and variety, and remember that potatoes dont count.   Go for whole grains - one-quarter of your plate: Whole wheat, barley, wheat berries, quinoa, oats, brown rice, and foods made with them. If you want pasta, go with whole wheat pasta.   Protein power - one-quarter of your plate: Fish, chicken, beans, and nuts are all healthy, versatile protein sources. Limit red meat.   The diet, however, does go beyond the plate, offering a few other suggestions.   Use healthy plant oils, such as olive, canola, soy, corn, sunflower and peanut. Check the labels, and avoid partially hydrogenated oil, which have unhealthy trans fats.   If youre thirsty, drink water. Coffee and tea are good in moderation, but skip sugary drinks and limit milk and dairy products to one or two daily servings.   The type of  carbohydrate in the diet is more important than the amount. Some sources of carbohydrates, such as vegetables, fruits, whole grains, and beans-are healthier than others.   Finally, stay active  Signed, Berniece Salines, DO  03/20/2019 2:23 PM    [place]

## 2019-03-20 NOTE — Patient Instructions (Addendum)
Medication Instructions:  Your physician has recommended you make the following change in your medication:  CHANGE HYDRALAZINE TO 50MG  EVERY 8 HOURS  *If you need a refill on your cardiac medications before your next appointment, please call your pharmacy*  Lab Work: Your physician recommends that you return for lab work Today: BMET Magnesium  If you have labs (blood work) drawn today and your tests are completely normal, you will receive your results only by: Marland Kitchen MyChart Message (if you have MyChart) OR . A paper copy in the mail If you have any lab test that is abnormal or we need to change your treatment, we will call you to review the results.  Testing/Procedures: Echocardiogram Your physician has requested that you have an echocardiogram. Echocardiography is a painless test that uses sound waves to create images of your heart. It provides your doctor with information about the size and shape of your heart and how well your heart's chambers and valves are working. This procedure takes approximately one hour. There are no restrictions for this procedure.   Follow-Up: At [facility], you and your health needs are our priority.  As part of our continuing mission to provide you with exceptional heart care, we have created designated Provider Care Teams.  These Care Teams include your primary Cardiologist (physician) and Advanced Practice Providers (APPs -  Physician Assistants and Nurse Practitioners) who all work together to provide you with the care you need, when you need it.  Your next appointment:   1 month  The format for your next appointment:   In Person  Provider:   You may see Dr. Harriet Masson  or the following Advanced Practice Provider on your designated Care Team:    Laurann Montana, FNP   Echocardiogram An echocardiogram is a procedure that uses painless sound waves (ultrasound) to produce an image of the heart. Images from an echocardiogram can provide important  information about:  Signs of coronary artery disease (CAD).  Aneurysm detection. An aneurysm is a weak or damaged part of an artery wall that bulges out from the normal force of blood pumping through the body.  Heart size and shape. Changes in the size or shape of the heart can be associated with certain conditions, including heart failure, aneurysm, and CAD.  Heart muscle function.  Heart valve function.  Signs of a past heart attack.  Fluid buildup around the heart.  Thickening of the heart muscle.  A tumor or infectious growth around the heart valves. Tell a health care provider about:  Any allergies you have.  All medicines you are taking, including vitamins, herbs, eye drops, creams, and over-the-counter medicines.  Any blood disorders you have.  Any surgeries you have had.  Any medical conditions you have.  Whether you are pregnant or may be pregnant. What are the risks? Generally, this is a safe procedure. However, problems may occur, including:  Allergic reaction to dye (contrast) that may be used during the procedure. What happens before the procedure? No specific preparation is needed. You may eat and drink normally. What happens during the procedure?   An IV tube may be inserted into one of your veins.  You may receive contrast through this tube. A contrast is an injection that improves the quality of the pictures from your heart.  A gel will be applied to your chest.  A wand-like tool (transducer) will be moved over your chest. The gel will help to transmit the sound waves from the transducer.  The  sound waves will harmlessly bounce off of your heart to allow the heart images to be captured in real-time motion. The images will be recorded on a computer. The procedure may vary among health care providers and hospitals. What happens after the procedure?  You may return to your normal, everyday life, including diet, activities, and medicines, unless your  health care provider tells you not to do that. Summary  An echocardiogram is a procedure that uses painless sound waves (ultrasound) to produce an image of the heart.  Images from an echocardiogram can provide important information about the size and shape of your heart, heart muscle function, heart valve function, and fluid buildup around your heart.  You do not need to do anything to prepare before this procedure. You may eat and drink normally.  After the echocardiogram is completed, you may return to your normal, everyday life, unless your health care provider tells you not to do that. This information is not intended to replace advice given to you by your health care provider. Make sure you discuss any questions you have with your health care provider. Document Released: 04/22/2000 Document Revised: 08/16/2018 Document Reviewed: 05/28/2016 Elsevier Patient Education  2020 [employer].

## 2019-03-21 LAB — BASIC METABOLIC PANEL
BUN/Creatinine Ratio: 16 (ref 9–20)
BUN: 33 mg/dL — ABNORMAL HIGH (ref 6–24)
CO2: 21 mmol/L (ref 20–29)
Calcium: 9.2 mg/dL (ref 8.7–10.2)
Chloride: 99 mmol/L (ref 96–106)
Creatinine, Ser: 2.03 mg/dL — ABNORMAL HIGH (ref 0.76–1.27)
GFR calc Af Amer: 43 mL/min/{1.73_m2} — ABNORMAL LOW (ref >59)
GFR calc non Af Amer: 37 mL/min/{1.73_m2} — ABNORMAL LOW (ref >59)
Glucose: 328 mg/dL — ABNORMAL HIGH (ref 65–99)
Potassium: 5.4 mmol/L — ABNORMAL HIGH (ref 3.5–5.2)
Sodium: 134 mmol/L (ref 134–144)

## 2019-03-21 LAB — MAGNESIUM: Magnesium: 1.9 mg/dL (ref 1.6–2.3)

## 2019-03-22 ENCOUNTER — Ambulatory Visit (HOSPITAL_BASED_OUTPATIENT_CLINIC_OR_DEPARTMENT_OTHER)
Admission: RE | Admit: 2019-03-22 | Discharge: 2019-03-22 | Disposition: A | Discharge status: Home / Self Care | Payer: Medicaid Other | Source: Ambulatory Visit | Attending: Cardiology | Admitting: Cardiology

## 2019-03-22 ENCOUNTER — Other Ambulatory Visit: Payer: Self-pay

## 2019-03-22 DIAGNOSIS — I420 Dilated cardiomyopathy: Secondary | ICD-10-CM | POA: Diagnosis present

## 2019-03-22 NOTE — Progress Notes (Signed)
  Echocardiogram 2D Echocardiogram has been performed.  Dustin Mcclain 03/22/2019, 11:48 AM

## 2019-03-27 ENCOUNTER — Telehealth: Payer: Self-pay | Admitting: *Deleted

## 2019-03-27 ENCOUNTER — Telehealth: Payer: Self-pay

## 2019-03-27 NOTE — Telephone Encounter (Signed)
Telephone call to patient. Left message of echo results and Dr Harriet Masson will discuss more at office visit 04/17/19.Patient instructed to call with any questions.

## 2019-03-27 NOTE — Telephone Encounter (Signed)
-----   Message from Berniece Salines, DO sent at 03/23/2019  9:08 AM EST ----- EF is 35-40% and the heart is dilated. The walls on the left side of the heart has thickened from the hypertension. We will talk about this in more detail at your visit next month.

## 2019-03-27 NOTE — Telephone Encounter (Signed)
Left voice message requesting return call to give lab results.

## 2019-04-03 NOTE — Telephone Encounter (Signed)
Left voice message requesting return call for lab results.

## 2019-04-10 ENCOUNTER — Encounter: Payer: Self-pay | Admitting: Internal Medicine

## 2019-04-12 DIAGNOSIS — Z6841 Body Mass Index (BMI) 40.0 and over, adult: Secondary | ICD-10-CM

## 2019-04-12 HISTORY — DX: Morbid (severe) obesity due to excess calories: E66.01

## 2019-04-12 HISTORY — DX: Body Mass Index (BMI) 40.0 and over, adult: Z684

## 2019-04-17 ENCOUNTER — Encounter: Payer: Self-pay | Admitting: Cardiology

## 2019-04-17 ENCOUNTER — Other Ambulatory Visit: Payer: Self-pay

## 2019-04-17 ENCOUNTER — Ambulatory Visit (INDEPENDENT_AMBULATORY_CARE_PROVIDER_SITE_OTHER): Payer: Medicaid Other | Admitting: Cardiology

## 2019-04-17 VITALS — BP 140/98 | HR 83 | Ht 71.0 in | Wt 372.0 lb

## 2019-04-17 DIAGNOSIS — I420 Dilated cardiomyopathy: Secondary | ICD-10-CM

## 2019-04-17 DIAGNOSIS — E1169 Type 2 diabetes mellitus with other specified complication: Secondary | ICD-10-CM | POA: Diagnosis not present

## 2019-04-17 DIAGNOSIS — I519 Heart disease, unspecified: Secondary | ICD-10-CM

## 2019-04-17 DIAGNOSIS — I10 Essential (primary) hypertension: Secondary | ICD-10-CM

## 2019-04-17 DIAGNOSIS — I5022 Chronic systolic (congestive) heart failure: Secondary | ICD-10-CM

## 2019-04-17 DIAGNOSIS — Z794 Long term (current) use of insulin: Secondary | ICD-10-CM

## 2019-04-17 HISTORY — DX: Heart disease, unspecified: I51.9

## 2019-04-17 MED ORDER — DAPAGLIFLOZIN PROPANEDIOL 10 MG PO TABS: 10.0000 mg | ORAL_TABLET | Freq: Every day | ORAL | 5 refills | Status: DC

## 2019-04-17 MED ORDER — ISOSORBIDE MONONITRATE ER 60 MG PO TB24: 60.0000 mg | ORAL_TABLET | Freq: Every day | ORAL | 1 refills | Status: DC

## 2019-04-17 NOTE — Patient Instructions (Addendum)
Medication Instructions:  Your physician has recommended you make the following change in your medication:   START: Farxiga 10 mg Take 1 tab daily START: Imdur(isosorbide) 60 mg Take 1 tab daily  *If you need a refill on your cardiac medications before your next appointment, please call your pharmacy*  Lab Work: Your physician recommends that you return for lab work in: TODAY BMP,Magnesium  If you have labs (blood work) drawn today and your tests are completely normal, you will receive your results only by: Marland Kitchen MyChart Message (if you have MyChart) OR . A paper copy in the mail If you have any lab test that is abnormal or we need to change your treatment, we will call you to review the results.  Testing/Procedures: None  Follow-Up: At [facility], you and your health needs are our priority.  As part of our continuing mission to provide you with exceptional heart care, we have created designated Provider Care Teams.  These Care Teams include your primary Cardiologist (physician) and Advanced Practice Providers (APPs -  Physician Assistants and Nurse Practitioners) who all work together to provide you with the care you need, when you need it.  Your next appointment:   1 month(s)  The format for your next appointment:   In Person  Provider:   Berniece Salines, DO  Other Instructions Isosorbide Mononitrate extended-release tablets What is this medicine? ISOSORBIDE MONONITRATE (eye soe SOR bide mon oh NYE trate) is a vasodilator. It relaxes blood vessels, increasing the blood and oxygen supply to your heart. This medicine is used to prevent chest pain caused by angina. It will not help to stop an episode of chest pain. This medicine may be used for other purposes; ask your health care provider or pharmacist if you have questions. COMMON BRAND NAME(S): Imdur, Isotrate ER What should I tell my health care provider before I take this medicine? They need to know if you have any of these  conditions:  previous heart attack or heart failure  an unusual or allergic reaction to isosorbide mononitrate, nitrates, other medicines, foods, dyes, or preservatives  pregnant or trying to get pregnant  breast-feeding How should I use this medicine? Take this medicine by mouth with a glass of water. Follow the directions on the prescription label. Do not crush or chew. Take your medicine at regular intervals. Do not take your medicine more often than directed. Do not stop taking this medicine except on the advice of your doctor or health care professional. Talk to your pediatrician regarding the use of this medicine in children. Special care may be needed. Overdosage: If you think you have taken too much of this medicine contact a poison control center or emergency room at once. NOTE: This medicine is only for you. Do not share this medicine with others. What if I miss a dose? If you miss a dose, take it as soon as you can. If it is almost time for your next dose, take only that dose. Do not take double or extra doses. What may interact with this medicine? Do not take this medicine with any of the following medications:  medicines used to treat erectile dysfunction (ED) like avanafil, sildenafil, tadalafil, and vardenafil  riociguat This medicine may also interact with the following medications:  medicines for high blood pressure  other medicines for angina or heart failure This list may not describe all possible interactions. Give your health care provider a list of all the medicines, herbs, non-prescription drugs, or dietary supplements  you use. Also tell them if you smoke, drink alcohol, or use illegal drugs. Some items may interact with your medicine. What should I watch for while using this medicine? Check your heart rate and blood pressure regularly while you are taking this medicine. Ask your doctor or health care professional what your heart rate and blood pressure should be  and when you should contact him or her. Tell your doctor or health care professional if you feel your medicine is no longer working. You may get dizzy. Do not drive, use machinery, or do anything that needs mental alertness until you know how this medicine affects you. To reduce the risk of dizzy or fainting spells, do not sit or stand up quickly, especially if you are an older patient. Alcohol can make you more dizzy, and increase flushing and rapid heartbeats. Avoid alcoholic drinks. Do not treat yourself for coughs, colds, or pain while you are taking this medicine without asking your doctor or health care professional for advice. Some ingredients may increase your blood pressure. What side effects may I notice from receiving this medicine? Side effects that you should report to your doctor or health care professional as soon as possible:  bluish discoloration of lips, fingernails, or palms of hands  irregular heartbeat, palpitations  low blood pressure  nausea, vomiting  persistent headache  unusually weak or tired Side effects that usually do not require medical attention (report to your doctor or health care professional if they continue or are bothersome):  flushing of the face or neck  rash This list may not describe all possible side effects. Call your doctor for medical advice about side effects. You may report side effects to FDA at 1-800-FDA-1088. Where should I keep my medicine? Keep out of the reach of children. Store between 15 and 30 degrees C (59 and 86 degrees F). Keep container tightly closed. Throw away any unused medicine after the expiration date. NOTE: This sheet is a summary. It may not cover all possible information. If you have questions about this medicine, talk to your doctor, pharmacist, or health care provider.  2020 Elsevier/Gold Standard (2013-02-22 14:48:19) Dapagliflozin tablets What is this medicine? DAPAGLIFLOZIN (DAP a gli FLOE zin) helps to treat  type 2 diabetes. It helps to control blood sugar. Treatment is combined with diet and exercise. This drug may also be used to reduce the risk of going to the hospital for heart failure if you have type 2 diabetes and risk factors for heart disease. This medicine may be used for other purposes; ask your health care provider or pharmacist if you have questions. COMMON BRAND NAME(S): Wilder Glade What should I tell my health care provider before I take this medicine? They need to know if you have any of these conditions:  dehydration  diabetic ketoacidosis  diet low in salt  eating less due to illness, surgery, dieting, or any other reason  having surgery  history of pancreatitis or pancreas problems  history of yeast infection of the penis or vagina  if you often drink alcohol  infections in the bladder, kidneys, or urinary tract  kidney disease  low blood pressure  on hemodialysis  problems urinating  type 1 diabetes  uncircumcised male  an unusual or allergic reaction to dapagliflozin, other medicines, foods, dyes, or preservatives  pregnant or trying to get pregnant  breast-feeding How should I use this medicine? Take this medicine by mouth with a glass of water. Follow the directions on the prescription label. You  can take it with or without food. If it upsets your stomach, take it with food. Take this medicine in the morning. Take your dose at the same time each day. Do not take more often than directed. Do not stop taking except on your doctor's advice. A special MedGuide will be given to you by the pharmacist with each prescription and refill. Be sure to read this information carefully each time. Talk to your pediatrician regarding the use of this medicine in children. Special care may be needed. Overdosage: If you think you have taken too much of this medicine contact a poison control center or emergency room at once. NOTE: This medicine is only for you. Do not share  this medicine with others. What if I miss a dose? If you miss a dose, take it as soon as you can. If it is almost time for your next dose, take only that dose. Do not take double or extra doses. What may interact with this medicine? Do not take this medicine with any of the following medications:  gatifloxacin This medicine may also interact with the following medications:  alcohol  certain medicines for blood pressure, heart disease  diuretics  insulin  nateglinide  pioglitazone  quinolone antibiotics like ciprofloxacin, levofloxacin, ofloxacin  repaglinide  some herbal dietary supplements  steroid medicines like prednisone or cortisone  sulfonylureas like glimepiride, glipizide, glyburide  thyroid medicine This list may not describe all possible interactions. Give your health care provider a list of all the medicines, herbs, non-prescription drugs, or dietary supplements you use. Also tell them if you smoke, drink alcohol, or use illegal drugs. Some items may interact with your medicine. What should I watch for while using this medicine? Visit your doctor or health care professional for regular checks on your progress. This medicine can cause a serious condition in which there is too much acid in the blood. If you develop nausea, vomiting, stomach pain, unusual tiredness, or breathing problems, stop taking this medicine and call your doctor right away. If possible, use a ketone dipstick to check for ketones in your urine. A test called the HbA1C (A1C) will be monitored. This is a simple blood test. It measures your blood sugar control over the last 2 to 3 months. You will receive this test every 3 to 6 months. Learn how to check your blood sugar. Learn the symptoms of low and high blood sugar and how to manage them. Always carry a quick-source of sugar with you in case you have symptoms of low blood sugar. Examples include hard sugar candy or glucose tablets. Make sure others  know that you can choke if you eat or drink when you develop serious symptoms of low blood sugar, such as seizures or unconsciousness. They must get medical help at once. Tell your doctor or health care professional if you have high blood sugar. You might need to change the dose of your medicine. If you are sick or exercising more than usual, you might need to change the dose of your medicine. Do not skip meals. Ask your doctor or health care professional if you should avoid alcohol. Many nonprescription cough and cold products contain sugar or alcohol. These can affect blood sugar. Wear a medical ID bracelet or chain, and carry a card that describes your disease and details of your medicine and dosage times. What side effects may I notice from receiving this medicine? Side effects that you should report to your doctor or health care professional as soon  as possible:  allergic reactions like skin rash, itching or hives, swelling of the face, lips, or tongue  breathing problems  dizziness  feeling faint or lightheaded, falls  muscle weakness  nausea, vomiting, unusual stomach upset or pain  new pain or tenderness, change in skin color, sores or ulcers, or infection in legs or feet  penile discharge, itching, or pain in men  signs and symptoms of a genital infection, such as fever; tenderness, redness, or swelling in the genitals or area from the genitals to the back of the rectum  signs and symptoms of low blood sugar such as feeling anxious, confusion, dizziness, increased hunger, unusually weak or tired, sweating, shakiness, cold, irritable, headache, blurred vision, fast heartbeat, loss of consciousness  signs and symptoms of a urinary tract infection, such as fever, chills, a burning feeling when urinating, blood in the urine, back pain  trouble passing urine or change in the amount of urine, including an urgent need to urinate more often, in larger amounts, or at night  unusual  tiredness  vaginal discharge, itching, or odor in women Side effects that usually do not require medical attention (report to your doctor or health care professional if they continue or are bothersome):  mild increase in urination  thirsty This list may not describe all possible side effects. Call your doctor for medical advice about side effects. You may report side effects to FDA at 1-800-FDA-1088. Where should I keep my medicine? Keep out of the reach of children. Store at room temperature between 15 and 30 degrees C (59 and 86 degrees F). Throw away any unused medicine after the expiration date. NOTE: This sheet is a summary. It may not cover all possible information. If you have questions about this medicine, talk to your doctor, pharmacist, or health care provider.  2020 [employer]vier/Gold Standard (2018-03-09 15:24:42)

## 2019-04-17 NOTE — Progress Notes (Signed)
Cardiology Office Note:    Date:  04/17/2019   ID:  Dustin Mcclain, DOB 1965/09/08, MRN [id number]  PCP:  Glendon Axe, MD  Cardiologist:  No primary care provider on file.  Electrophysiologist:  None   Referring MD: Glendon Axe, MD   Chief Complaint  Patient presents with   Follow-up    History of Present Illness:    Dustin Mcclain is a 53 y.o. male with a hx of Dustin Mcclain is a 53 y.o. male with a hx of hypertension, CKD stage 3, hyperlipidemia, chronic systolic heart failure, Dilated CM with a recent EF in was 35-40%.  I last saw the patient on member 03/29/2019 at that time he has been taken off Entresto due to cost.  He was following as he had been lost to follow-up from cardiology.  During his visit, his blood pressure was elevated and we discussed strategies for improving his blood pressure.  His medication regimen included carvedilol 25 mg twice a days, furosemide 40 mg daily in the morning and 20 mg at night, this is all by mononitrate 30 mg daily, spironolactone 25 mg daily and I increase his hydralazine from 50 mg twice a day to 50 mg every 8 hours.  Addition cardiogram was ordered to to reassess his LV function.  In the interim the patient has been unable to get his echocardiogram.  He is here today for follow-up visit.  He admits that he has been taking his medication as prescribed.  He recently just saw his endocrinologist at [facility] on April 12, 2019.  He offers no complaints at this time.  Past Medical History:  Diagnosis Date   Allergic rhinitis, cause unspecified    Anemia in stage 3 chronic kidney disease    sees dr Arty Baumgartner every 3 months   Anxiety state, unspecified    Asthma    mild   Benign neoplasm of descending colon 04/15/2014   Cardiomyopathy ([place]) 07/11/2008   Qualifier: Diagnosis of  By: Annamaria Boots MD, Clinton D    CHF 07/27/2008   Qualifier: Diagnosis of  By: Annamaria Boots MD, Clinton D    CHF (congestive heart failure) ([place])    right side    Chronic systolic congestive heart failure ([place]) 10/22/2015   Depressive disorder, not elsewhere classified    Diabetes mellitus without complication ([place])    DM 10/23/2009   Qualifier: Diagnosis of  By: Annamaria Boots MD, Clinton D    GERD (gastroesophageal reflux disease)    Insomnia 06/06/2008   Qualifier: Diagnosis of  By: Lockie Pares CMA, [name]     Insomnia, unspecified    Joint effusion 08/06/2013   Mixed hyperlipidemia 10/03/2017   Morbid obesity ([place])    Obstructive sleep apnea 06/06/2008   NPSG 08/15/16   AHI 10.8/ hr, desaturation to 83%, body weight 365 lbs- requalified for CPAP    Other primary cardiomyopathies    Right knee pain 08/06/2013   Seasonal allergic rhinitis 07/27/2008   Symptom patterns suggests primary sensitivity to spring grass pollens. Environmental precautions discussed. Potential candidate for sublingual therapy.     Shortness of breath 10/19/2015   Tendinitis    left wrist and elbow and left knee   Unspecified disorder resulting from impaired renal function    Unspecified essential hypertension    Unspecified sleep apnea    cpap setting of 22 or 23    Past Surgical History:  Procedure Laterality Date   APPENDECTOMY     COLONOSCOPY WITH PROPOFOL N/A 04/15/2014  Procedure: COLONOSCOPY WITH PROPOFOL;  Surgeon: Irene Shipper, MD;  Location: [facility];  Service: Endoscopy;  Laterality: N/A;   PATELLAR TENDON REPAIR     left    Current Medications: Current Meds  Medication Sig   albuterol (PROVENTIL HFA;VENTOLIN HFA) 108 (90 BASE) MCG/ACT inhaler Inhale 2 puffs into the lungs every 4 (four) hours as needed for wheezing or shortness of breath.   atorvastatin (LIPITOR) 10 MG tablet Take 10 mg by mouth daily.   carvedilol (COREG) 25 MG tablet Take 25 mg by mouth 2 (two) times daily with a meal.    cholecalciferol (VITAMIN D) 1000 UNITS tablet Take 3,000 Units by mouth every morning.    colchicine 0.6 MG tablet Take 0.6 mg by mouth as needed.     furosemide (LASIX) 40 MG tablet Take 1 tablet (40 mg total) by mouth every morning AND 0.5 tablets (20 mg total) every evening.   hydrALAZINE (APRESOLINE) 50 MG tablet Take 1 tablet (50mg ) every 8 hours   Insulin Detemir (LEVEMIR FLEXPEN [place]) Inject 60 Units into the skin at bedtime.    Liraglutide (VICTOZA [place]) Inject into the skin. 1.2 mg at bedtime   Magnesium 250 MG TABS Take 1 tablet by mouth every morning.    potassium chloride SA (K-DUR,KLOR-CON) 20 MEQ tablet Take 20 mEq by mouth 2 (two) times daily.   spironolactone (ALDACTONE) 25 MG tablet Take 25 mg by mouth 2 (two) times daily.   Testosterone (ANDROGEL) 20.25 MG/1.25GM (1.62%) GEL Place 1 application onto the skin every morning.      Allergies:   Patient has no known allergies.   Social History   Socioeconomic History   Marital status: Divorced    Spouse name: Not on file   Number of children: Not on file   Years of education: Not on file   Highest education level: Not on file  Occupational History   Not on file  Social Needs   Financial resource strain: Not on file   Food insecurity    Worry: Not on file    Inability: Not on file   Transportation needs    Medical: Not on file    Non-medical: Not on file  Tobacco Use   Smoking status: Never Smoker   Smokeless tobacco: Never Used  Substance and Sexual Activity   Alcohol use: Yes    Alcohol/week: 1.0 standard drinks    Types: 1 Cans of beer per week    Comment: socially   Drug use: No   Sexual activity: Not on file  Lifestyle   Physical activity    Days per week: Not on file    Minutes per session: Not on file   Stress: Not on file  Relationships   Social connections    Talks on phone: Not on file    Gets together: Not on file    Attends religious service: Not on file    Active member of club or organization: Not on file    Attends meetings of clubs or organizations: Not on file    Relationship status: Not on file  Other Topics  Concern   Not on file  Social History Narrative   Not on file     Family History: The patient's family history includes Diabetes in his father and mother; Heart disease in his father and mother.  ROS:   Review of Systems  Constitution: Negative for decreased appetite, fever and weight gain.  HENT: Negative for congestion, ear discharge, hoarse voice  and sore throat.   Eyes: Negative for discharge, redness, vision loss in right eye and visual halos.  Cardiovascular: Negative for chest pain, dyspnea on exertion, leg swelling, orthopnea and palpitations.  Respiratory: Negative for cough, hemoptysis, shortness of breath and snoring.   Endocrine: Negative for heat intolerance and polyphagia.  Hematologic/Lymphatic: Negative for bleeding problem. Does not bruise/bleed easily.  Skin: Negative for flushing, nail changes, rash and suspicious lesions.  Musculoskeletal: Negative for arthritis, joint pain, muscle cramps, myalgias, neck pain and stiffness.  Gastrointestinal: Negative for abdominal pain, bowel incontinence, diarrhea and excessive appetite.  Genitourinary: Negative for decreased libido, genital sores and incomplete emptying.  Neurological: Negative for brief paralysis, focal weakness, headaches and loss of balance.  Psychiatric/Behavioral: Negative for altered mental status, depression and suicidal ideas.  Allergic/Immunologic: Negative for HIV exposure and persistent infections.    EKGs/Labs/Other Studies Reviewed:    The following studies were reviewed today:   EKG:  None today  TTE IMPRESSIONS 03/22/2019:  1. Left ventricular ejection fraction, by visual estimation, is 35 to 40%. The left ventricle has moderately decreased function. Mildly dilated left ventricle with global hypokinesis. Left ventricular septal wall thickness was severely increased.  Moderately increased left ventricular posterior wall thickness. The left ventricular hypertrophy is asymmetrical.  2. Left  ventricular diastolic parameters are consistent with Grade II diastolic dysfunction (pseudonormalization).  3. Global right ventricle has normal systolic function.The right ventricular size is moderately enlarged. No increase in right ventricular wall thickness.  4. Left atrial size was normal.  5. Right atrial size was normal.  6. The mitral valve is normal in structure. Trace mitral valve regurgitation. No evidence of mitral stenosis.  7. The tricuspid valve is normal in structure. Tricuspid valve regurgitation is not demonstrated.  8. The aortic valve is normal in structure. Aortic valve regurgitation is not visualized. No evidence of aortic valve sclerosis or stenosis.  9. The pulmonic valve was normal in structure. Pulmonic valve regurgitation is not visualized. 10. Normal pulmonary artery systolic pressure.   Recent Labs: 06/28/2018: Hemoglobin 12.0; Platelets 301 03/20/2019: BUN 33; Creatinine, Ser 2.03; Magnesium 1.9; Potassium 5.4; Sodium 134  Recent Lipid Panel    Component Value Date/Time   CHOL 191 09/27/2018 1010   TRIG 193 (H) 09/27/2018 1010   HDL 34 (L) 09/27/2018 1010   CHOLHDL 5.6 09/27/2018 1010   VLDL 39 09/27/2018 1010   LDLCALC 118 (H) 09/27/2018 1010    Physical Exam:    VS:  BP (!) 140/98 (BP Location: Right Arm, Patient Position: Sitting, Cuff Size: Large)    Pulse 83    Ht 5\' 11"  (1.803 m)    Wt (!) 372 lb (168.7 kg)    SpO2 98%    BMI 51.88 kg/m     Wt Readings from Last 3 Encounters:  04/17/19 (!) 372 lb (168.7 kg)  03/20/19 (!) 374 lb (169.6 kg)  12/20/18 (!) 368 lb 3.2 oz (167 kg)     GEN: Well nourished, well developed in no acute distress HEENT: Normal NECK: No JVD; No carotid bruits LYMPHATICS: No lymphadenopathy CARDIAC: S1S2 noted,RRR, no murmurs, rubs, gallops RESPIRATORY:  Clear to auscultation without rales, wheezing or rhonchi  ABDOMEN: Soft, non-tender, non-distended, +bowel sounds, no guarding. EXTREMITIES: No edema, No cyanosis, no  clubbing MUSCULOSKELETAL:  No edema; No deformity  SKIN: Warm and dry NEUROLOGIC:  Alert and oriented x 3, non-focal PSYCHIATRIC:  Normal affect, good insight  ASSESSMENT:    1. Chronic systolic congestive heart failure ([place])  2. Depressed left ventricular systolic function   3. Uncontrolled hypertension   4. Type 2 diabetes mellitus with other specified complication, with long-term current use of insulin ([place])   5. Dilated cardiomyopathy ([place])    PLAN:     Upon review of his medication list, it seem that the patient has not been taking Isosorbride mononitrate 60 mg daily. He should be on this medication therefore I will restart it. In addition due to his depressed LV systolic function and his GFR>30 he will be a good candidate for fargixa. I will add farxiga 10 mg daily to his medication regimen.  His cardiovascular regimen will now be coreg 25 mg BID, imdur 60 mg daily, Aldactone 25 mg daily, hydralazine 50mg  twice daily ( as the patient states the he will not be able to take this medication every 8 hours), lasix 40mg  in the am and Lasix 20 mg in the pm.   Continue Lipitor 10 mg daily.   His DM is being managed by endocrinologist at [facility].  Obesity - weight loss encouraged.   The patient is in agreement with the above plan. The patient left the office in stable condition.  The patient will follow up in 1 month.    Medication Adjustments/Labs and Tests Ordered: Current medicines are reviewed at length with the patient today.  Concerns regarding medicines are outlined above.  Orders Placed This Encounter  Procedures   Basic Metabolic Panel (BMET)   Magnesium   Meds ordered this encounter  Medications   dapagliflozin propanediol (FARXIGA) 10 MG TABS tablet    Sig: Take 10 mg by mouth daily before breakfast.    Dispense:  30 tablet    Refill:  5   isosorbide mononitrate (IMDUR) 60 MG 24 hr tablet    Sig: Take 1 tablet (60 mg total) by mouth daily.    Dispense:  90  tablet    Refill:  1    Patient Instructions  Medication Instructions:  Your physician has recommended you make the following change in your medication:   START: Farxiga 10 mg Take 1 tab daily START: Imdur(isosorbide) 60 mg Take 1 tab daily  *If you need a refill on your cardiac medications before your next appointment, please call your pharmacy*  Lab Work: Your physician recommends that you return for lab work in: TODAY BMP,Magnesium  If you have labs (blood work) drawn today and your tests are completely normal, you will receive your results only by:  [place] (if you have [place]) OR  A paper copy in the mail If you have any lab test that is abnormal or we need to change your treatment, we will call you to review the results.  Testing/Procedures: None  Follow-Up: At [facility], you and your health needs are our priority.  As part of our continuing mission to provide you with exceptional heart care, we have created designated Provider Care Teams.  These Care Teams include your primary Cardiologist (physician) and Advanced Practice Providers (APPs -  Physician Assistants and Nurse Practitioners) who all work together to provide you with the care you need, when you need it.  Your next appointment:   1 month(s)  The format for your next appointment:   In Person  Provider:   Berniece Salines, DO  Other Instructions Isosorbide Mononitrate extended-release tablets What is this medicine? ISOSORBIDE MONONITRATE (eye soe SOR bide mon oh NYE trate) is a vasodilator. It relaxes blood vessels, increasing the blood and oxygen supply to your heart.  This medicine is used to prevent chest pain caused by angina. It will not help to stop an episode of chest pain. This medicine may be used for other purposes; ask your health care provider or pharmacist if you have questions. COMMON BRAND NAME(S): Imdur, Isotrate ER What should I tell my health care provider before I take this  medicine? They need to know if you have any of these conditions:  previous heart attack or heart failure  an unusual or allergic reaction to isosorbide mononitrate, nitrates, other medicines, foods, dyes, or preservatives  pregnant or trying to get pregnant  breast-feeding How should I use this medicine? Take this medicine by mouth with a glass of water. Follow the directions on the prescription label. Do not crush or chew. Take your medicine at regular intervals. Do not take your medicine more often than directed. Do not stop taking this medicine except on the advice of your doctor or health care professional. Talk to your pediatrician regarding the use of this medicine in children. Special care may be needed. Overdosage: If you think you have taken too much of this medicine contact a poison control center or emergency room at once. NOTE: This medicine is only for you. Do not share this medicine with others. What if I miss a dose? If you miss a dose, take it as soon as you can. If it is almost time for your next dose, take only that dose. Do not take double or extra doses. What may interact with this medicine? Do not take this medicine with any of the following medications:  medicines used to treat erectile dysfunction (ED) like avanafil, sildenafil, tadalafil, and vardenafil  riociguat This medicine may also interact with the following medications:  medicines for high blood pressure  other medicines for angina or heart failure This list may not describe all possible interactions. Give your health care provider a list of all the medicines, herbs, non-prescription drugs, or dietary supplements you use. Also tell them if you smoke, drink alcohol, or use illegal drugs. Some items may interact with your medicine. What should I watch for while using this medicine? Check your heart rate and blood pressure regularly while you are taking this medicine. Ask your doctor or health care  professional what your heart rate and blood pressure should be and when you should contact him or her. Tell your doctor or health care professional if you feel your medicine is no longer working. You may get dizzy. Do not drive, use machinery, or do anything that needs mental alertness until you know how this medicine affects you. To reduce the risk of dizzy or fainting spells, do not sit or stand up quickly, especially if you are an older patient. Alcohol can make you more dizzy, and increase flushing and rapid heartbeats. Avoid alcoholic drinks. Do not treat yourself for coughs, colds, or pain while you are taking this medicine without asking your doctor or health care professional for advice. Some ingredients may increase your blood pressure. What side effects may I notice from receiving this medicine? Side effects that you should report to your doctor or health care professional as soon as possible:  bluish discoloration of lips, fingernails, or palms of hands  irregular heartbeat, palpitations  low blood pressure  nausea, vomiting  persistent headache  unusually weak or tired Side effects that usually do not require medical attention (report to your doctor or health care professional if they continue or are bothersome):  flushing of the face or  neck  rash This list may not describe all possible side effects. Call your doctor for medical advice about side effects. You may report side effects to FDA at 1-800-FDA-1088. Where should I keep my medicine? Keep out of the reach of children. Store between 15 and 30 degrees C (59 and 86 degrees F). Keep container tightly closed. Throw away any unused medicine after the expiration date. NOTE: This sheet is a summary. It may not cover all possible information. If you have questions about this medicine, talk to your doctor, pharmacist, or health care provider.  2020 Elsevier/Gold Standard (2013-02-22 14:48:19) Dapagliflozin tablets What is this  medicine? DAPAGLIFLOZIN (DAP a gli FLOE zin) helps to treat type 2 diabetes. It helps to control blood sugar. Treatment is combined with diet and exercise. This drug may also be used to reduce the risk of going to the hospital for heart failure if you have type 2 diabetes and risk factors for heart disease. This medicine may be used for other purposes; ask your health care provider or pharmacist if you have questions. COMMON BRAND NAME(S): Wilder Glade What should I tell my health care provider before I take this medicine? They need to know if you have any of these conditions:  dehydration  diabetic ketoacidosis  diet low in salt  eating less due to illness, surgery, dieting, or any other reason  having surgery  history of pancreatitis or pancreas problems  history of yeast infection of the penis or vagina  if you often drink alcohol  infections in the bladder, kidneys, or urinary tract  kidney disease  low blood pressure  on hemodialysis  problems urinating  type 1 diabetes  uncircumcised male  an unusual or allergic reaction to dapagliflozin, other medicines, foods, dyes, or preservatives  pregnant or trying to get pregnant  breast-feeding How should I use this medicine? Take this medicine by mouth with a glass of water. Follow the directions on the prescription label. You can take it with or without food. If it upsets your stomach, take it with food. Take this medicine in the morning. Take your dose at the same time each day. Do not take more often than directed. Do not stop taking except on your doctor's advice. A special MedGuide will be given to you by the pharmacist with each prescription and refill. Be sure to read this information carefully each time. Talk to your pediatrician regarding the use of this medicine in children. Special care may be needed. Overdosage: If you think you have taken too much of this medicine contact a poison control center or emergency room  at once. NOTE: This medicine is only for you. Do not share this medicine with others. What if I miss a dose? If you miss a dose, take it as soon as you can. If it is almost time for your next dose, take only that dose. Do not take double or extra doses. What may interact with this medicine? Do not take this medicine with any of the following medications:  gatifloxacin This medicine may also interact with the following medications:  alcohol  certain medicines for blood pressure, heart disease  diuretics  insulin  nateglinide  pioglitazone  quinolone antibiotics like ciprofloxacin, levofloxacin, ofloxacin  repaglinide  some herbal dietary supplements  steroid medicines like prednisone or cortisone  sulfonylureas like glimepiride, glipizide, glyburide  thyroid medicine This list may not describe all possible interactions. Give your health care provider a list of all the medicines, herbs, non-prescription drugs, or dietary  supplements you use. Also tell them if you smoke, drink alcohol, or use illegal drugs. Some items may interact with your medicine. What should I watch for while using this medicine? Visit your doctor or health care professional for regular checks on your progress. This medicine can cause a serious condition in which there is too much acid in the blood. If you develop nausea, vomiting, stomach pain, unusual tiredness, or breathing problems, stop taking this medicine and call your doctor right away. If possible, use a ketone dipstick to check for ketones in your urine. A test called the HbA1C (A1C) will be monitored. This is a simple blood test. It measures your blood sugar control over the last 2 to 3 months. You will receive this test every 3 to 6 months. Learn how to check your blood sugar. Learn the symptoms of low and high blood sugar and how to manage them. Always carry a quick-source of sugar with you in case you have symptoms of low blood sugar. Examples  include hard sugar candy or glucose tablets. Make sure others know that you can choke if you eat or drink when you develop serious symptoms of low blood sugar, such as seizures or unconsciousness. They must get medical help at once. Tell your doctor or health care professional if you have high blood sugar. You might need to change the dose of your medicine. If you are sick or exercising more than usual, you might need to change the dose of your medicine. Do not skip meals. Ask your doctor or health care professional if you should avoid alcohol. Many nonprescription cough and cold products contain sugar or alcohol. These can affect blood sugar. Wear a medical ID bracelet or chain, and carry a card that describes your disease and details of your medicine and dosage times. What side effects may I notice from receiving this medicine? Side effects that you should report to your doctor or health care professional as soon as possible:  allergic reactions like skin rash, itching or hives, swelling of the face, lips, or tongue  breathing problems  dizziness  feeling faint or lightheaded, falls  muscle weakness  nausea, vomiting, unusual stomach upset or pain  new pain or tenderness, change in skin color, sores or ulcers, or infection in legs or feet  penile discharge, itching, or pain in men  signs and symptoms of a genital infection, such as fever; tenderness, redness, or swelling in the genitals or area from the genitals to the back of the rectum  signs and symptoms of low blood sugar such as feeling anxious, confusion, dizziness, increased hunger, unusually weak or tired, sweating, shakiness, cold, irritable, headache, blurred vision, fast heartbeat, loss of consciousness  signs and symptoms of a urinary tract infection, such as fever, chills, a burning feeling when urinating, blood in the urine, back pain  trouble passing urine or change in the amount of urine, including an urgent need to  urinate more often, in larger amounts, or at night  unusual tiredness  vaginal discharge, itching, or odor in women Side effects that usually do not require medical attention (report to your doctor or health care professional if they continue or are bothersome):  mild increase in urination  thirsty This list may not describe all possible side effects. Call your doctor for medical advice about side effects. You may report side effects to FDA at 1-800-FDA-1088. Where should I keep my medicine? Keep out of the reach of children. Store at room temperature between 15  and 30 degrees C (59 and 86 degrees F). Throw away any unused medicine after the expiration date. NOTE: This sheet is a summary. It may not cover all possible information. If you have questions about this medicine, talk to your doctor, pharmacist, or health care provider.  2020 Elsevier/Gold Standard (2018-03-09 15:24:42)     Adopting a Healthy Lifestyle.  Know what a healthy weight is for you (roughly BMI <25) and aim to maintain this   Aim for 7+ servings of fruits and vegetables daily   65-80+ fluid ounces of water or unsweet tea for healthy kidneys   Limit to max 1 drink of alcohol per day; avoid smoking/tobacco   Limit animal fats in diet for cholesterol and heart health - choose grass fed whenever available   Avoid highly processed foods, and foods high in saturated/trans fats   Aim for low stress - take time to unwind and care for your mental health   Aim for 150 min of moderate intensity exercise weekly for heart health, and weights twice weekly for bone health   Aim for 7-9 hours of sleep daily   When it comes to diets, agreement about the perfect plan isnt easy to find, even among the experts. Experts at the [place] developed an idea known as the Healthy Eating Plate. Just imagine a plate divided into logical, healthy portions.   The emphasis is on diet quality:   Load up on  vegetables and fruits - one-half of your plate: Aim for color and variety, and remember that potatoes dont count.   Go for whole grains - one-quarter of your plate: Whole wheat, barley, wheat berries, quinoa, oats, brown rice, and foods made with them. If you want pasta, go with whole wheat pasta.   Protein power - one-quarter of your plate: Fish, chicken, beans, and nuts are all healthy, versatile protein sources. Limit red meat.   The diet, however, does go beyond the plate, offering a few other suggestions.   Use healthy plant oils, such as olive, canola, soy, corn, sunflower and peanut. Check the labels, and avoid partially hydrogenated oil, which have unhealthy trans fats.   If youre thirsty, drink water. Coffee and tea are good in moderation, but skip sugary drinks and limit milk and dairy products to one or two daily servings.   The type of carbohydrate in the diet is more important than the amount. Some sources of carbohydrates, such as vegetables, fruits, whole grains, and beans-are healthier than others.   Finally, stay active  Signed, Berniece Salines, DO  04/17/2019 7:37 PM    [facility]

## 2019-04-18 ENCOUNTER — Telehealth: Payer: Self-pay | Admitting: *Deleted

## 2019-04-18 DIAGNOSIS — I5022 Chronic systolic (congestive) heart failure: Secondary | ICD-10-CM

## 2019-04-18 LAB — BASIC METABOLIC PANEL
BUN/Creatinine Ratio: 16 (ref 9–20)
BUN: 31 mg/dL — ABNORMAL HIGH (ref 6–24)
CO2: 22 mmol/L (ref 20–29)
Calcium: 10 mg/dL (ref 8.7–10.2)
Chloride: 104 mmol/L (ref 96–106)
Creatinine, Ser: 1.94 mg/dL — ABNORMAL HIGH (ref 0.76–1.27)
GFR calc Af Amer: 45 mL/min/{1.73_m2} — ABNORMAL LOW (ref >59)
GFR calc non Af Amer: 39 mL/min/{1.73_m2} — ABNORMAL LOW (ref >59)
Glucose: 249 mg/dL — ABNORMAL HIGH (ref 65–99)
Potassium: 6.1 mmol/L — ABNORMAL HIGH (ref 3.5–5.2)
Sodium: 138 mmol/L (ref 134–144)

## 2019-04-18 LAB — MAGNESIUM: Magnesium: 1.9 mg/dL (ref 1.6–2.3)

## 2019-04-18 MED ORDER — HYDRALAZINE HCL 50 MG PO TABS: 100.0000 mg | ORAL_TABLET | Freq: Every day | ORAL | 1 refills | Status: DC

## 2019-04-18 NOTE — Telephone Encounter (Signed)
-----   Message from Berniece Salines, DO sent at 04/18/2019  8:23 AM EST ----- Please notify patient that his potassium is elevated at 6.1.  Have him hold his spironolactone for 3 days and come to get a BMP done on Tuesday.  While holding his spironolactone ask him to increase his hydralazine to 100 mg daily (that  will be to pills of his 50 mg that he already has).

## 2019-04-18 NOTE — Telephone Encounter (Signed)
Telephone call to patient. Left message with lab results and need to hold spironolactone  For 3 days and repeat BMP on Tuesday. Also to Increase hydralazine to 100 mg ( 2 50 mg Tabs ) daily. Asked that he call back to confirm he received the message

## 2019-04-22 NOTE — Telephone Encounter (Signed)
Telephone call to patient. Left message to return call regarding labs.

## 2019-04-23 ENCOUNTER — Other Ambulatory Visit: Payer: Self-pay | Admitting: *Deleted

## 2019-04-23 MED ORDER — DAPAGLIFLOZIN PROPANEDIOL 10 MG PO TABS: 10.0000 mg | ORAL_TABLET | Freq: Every day | ORAL | 5 refills | Status: DC

## 2019-04-24 ENCOUNTER — Telehealth: Payer: Self-pay | Admitting: *Deleted

## 2019-04-24 NOTE — Telephone Encounter (Signed)
Called [place] [employer] at [phone] for prior auth on patientt's Farxiga 10 mg . Case # [id number], Ref # [id number].

## 2019-04-26 LAB — BASIC METABOLIC PANEL
BUN/Creatinine Ratio: 13 (ref 9–20)
BUN: 23 mg/dL (ref 6–24)
CO2: 23 mmol/L (ref 20–29)
Calcium: 9.2 mg/dL (ref 8.7–10.2)
Chloride: 100 mmol/L (ref 96–106)
Creatinine, Ser: 1.79 mg/dL — ABNORMAL HIGH (ref 0.76–1.27)
GFR calc Af Amer: 50 mL/min/{1.73_m2} — ABNORMAL LOW (ref >59)
GFR calc non Af Amer: 43 mL/min/{1.73_m2} — ABNORMAL LOW (ref >59)
Glucose: 180 mg/dL — ABNORMAL HIGH (ref 65–99)
Potassium: 4.7 mmol/L (ref 3.5–5.2)
Sodium: 135 mmol/L (ref 134–144)

## 2019-05-13 ENCOUNTER — Ambulatory Visit (INDEPENDENT_AMBULATORY_CARE_PROVIDER_SITE_OTHER): Payer: Medicaid Other | Admitting: Cardiology

## 2019-05-13 ENCOUNTER — Encounter: Payer: Self-pay | Admitting: Cardiology

## 2019-05-13 ENCOUNTER — Other Ambulatory Visit: Payer: Self-pay

## 2019-05-13 VITALS — BP 152/84 | Ht 71.0 in | Wt 377.0 lb

## 2019-05-13 DIAGNOSIS — I519 Heart disease, unspecified: Secondary | ICD-10-CM

## 2019-05-13 DIAGNOSIS — I420 Dilated cardiomyopathy: Secondary | ICD-10-CM

## 2019-05-13 DIAGNOSIS — I5022 Chronic systolic (congestive) heart failure: Secondary | ICD-10-CM

## 2019-05-13 DIAGNOSIS — G4733 Obstructive sleep apnea (adult) (pediatric): Secondary | ICD-10-CM | POA: Diagnosis not present

## 2019-05-13 DIAGNOSIS — I110 Hypertensive heart disease with heart failure: Secondary | ICD-10-CM

## 2019-05-13 MED ORDER — LOSARTAN POTASSIUM 50 MG PO TABS: 50.0000 mg | ORAL_TABLET | Freq: Every day | ORAL | 1 refills | Status: DC

## 2019-05-13 NOTE — Progress Notes (Signed)
Cardiology Office Note:    Date:  05/13/2019   ID:  Dustin Mcclain, DOB [birth year], MRN [id number]  PCP:  Glendon Axe, MD  Cardiologist:  Berniece Salines, DO  Electrophysiologist:  None   Referring MD: Glendon Axe, MD   Chief Complaint  Patient presents with  . Hypertension  . Congestive Heart Failure   History of Present Illness:    Dustin Mcclain is a 54 y.o. male with a hx of history of hypertension, CKD stage III, hyperlipidemia, systolic heart failure, dilated cardiomyopathy EF 35 to 40%, obesity presents today for follow-up visit.    I initially saw the patient March 29, 2019 at the time when taking off Entresto due to cost and his medication was denied by his insurance company to be covered.During his visit, his blood pressure was elevated and we discussed strategies for improving his blood pressure.  His medication regimen included carvedilol 25 mg twice a days, furosemide 40 mg daily in the morning and 20 mg at night, this is all by mononitrate 30 mg daily, spironolactone 25 mg daily and I increase his hydralazine from 50 mg twice a day to 50 mg every 8 hours.    In addition an echocardiogram was ordered to assess his LV function.  Last saw the patient April 17, 2019.  During that time we will discuss his results from his echocardiogram.  I also started the patient on Farxiga milligrams daily, isosorbide mononitrate was restarted since he had not been taking his medication.  He reported that he was unable to take his hydralazine 3 times a day therefore hydralazine was increased to 100 mg twice daily he was maintained on his Coreg 25 mg twice daily, Aldactone 25 mg daily his Lasix 40 mg in the morning and 20 mg in the p.m.  In the interim the patient tells me that his insurance company had denied his Wilder Glade despite our attempt to obtain preauthorization.  He offers no complaints today.   Past Medical History:  Diagnosis Date  . Allergic rhinitis, cause unspecified   .  Anemia in stage 3 chronic kidney disease    sees dr Arty Baumgartner every 3 months  . Anxiety state, unspecified   . Asthma    mild  . Benign neoplasm of descending colon 04/15/2014  . Cardiomyopathy ([place]) 07/11/2008   Qualifier: Diagnosis of  By: Annamaria Boots MD, Clinton D   . CHF 07/27/2008   Qualifier: Diagnosis of  By: Annamaria Boots MD, Clinton D   . CHF (congestive heart failure) ([place])    right side  . Chronic systolic congestive heart failure ([place]) 10/22/2015  . Depressive disorder, not elsewhere classified   . Diabetes mellitus without complication ([place])   . DM 10/23/2009   Qualifier: Diagnosis of  By: Annamaria Boots MD, Clinton D   . GERD (gastroesophageal reflux disease)   . Insomnia 06/06/2008   Qualifier: Diagnosis of  By: Lockie Pares CMA, [name]    . Insomnia, unspecified   . Joint effusion 08/06/2013  . Mixed hyperlipidemia 10/03/2017  . Morbid obesity ([place])   . Obstructive sleep apnea 06/06/2008   NPSG 08/15/16   AHI 10.8/ hr, desaturation to 83%, body weight 365 lbs- requalified for CPAP   . Other primary cardiomyopathies   . Right knee pain 08/06/2013  . Seasonal allergic rhinitis 07/27/2008   Symptom patterns suggests primary sensitivity to spring grass pollens. Environmental precautions discussed. Potential candidate for sublingual therapy.    . Shortness of breath 10/19/2015  . Tendinitis  left wrist and elbow and left knee  . Unspecified disorder resulting from impaired renal function   . Unspecified essential hypertension   . Unspecified sleep apnea    cpap setting of 22 or 23    Past Surgical History:  Procedure Laterality Date  . APPENDECTOMY    . COLONOSCOPY WITH PROPOFOL N/A 04/15/2014   Procedure: COLONOSCOPY WITH PROPOFOL;  Surgeon: Irene Shipper, MD;  Location: [facility] END[facility]COPY;  Service: Endoscopy;  Laterality: N/A;  . PATELLAR TENDON REPAIR     left    Current Medications: Current Meds  Medication Sig  . albuterol (PROVENTIL HFA;VENTOLIN HFA) 108 (90 BASE) MCG/ACT inhaler Inhale 2 puffs  into the lungs every 4 (four) hours as needed for wheezing or shortness of breath.  Marland Kitchen atorvastatin (LIPITOR) 10 MG tablet Take 10 mg by mouth daily.  . carvedilol (COREG) 25 MG tablet Take 25 mg by mouth 2 (two) times daily with a meal.   . cholecalciferol (VITAMIN D) 1000 UNITS tablet Take 3,000 Units by mouth every morning.   . colchicine 0.6 MG tablet Take 0.6 mg by mouth as needed.   . furosemide (LASIX) 40 MG tablet Take 1 tablet (40 mg total) by mouth every morning AND 0.5 tablets (20 mg total) every evening.  . hydrALAZINE (APRESOLINE) 50 MG tablet Take 2 tablets (100 mg total) by mouth daily.  . insulin regular human CONCENTRATED (HUMULIN R) 500 UNIT/ML kwikpen Use as directed. Max daily dose is 300 units daily.  . isosorbide mononitrate (IMDUR) 60 MG 24 hr tablet Take 1 tablet (60 mg total) by mouth daily.  . Liraglutide (VICTOZA [place]) Inject into the skin. 1.2 mg at bedtime  . Magnesium 250 MG TABS Take 1 tablet by mouth every morning.   . potassium chloride SA (K-DUR,KLOR-CON) 20 MEQ tablet Take 20 mEq by mouth 2 (two) times daily.  Marland Kitchen spironolactone (ALDACTONE) 25 MG tablet Take 25 mg by mouth 2 (two) times daily.  . Testosterone (ANDROGEL) 20.25 MG/1.25GM (1.62%) GEL Place 1 application onto the skin every morning.   . [DISCONTINUED] Insulin Detemir (LEVEMIR FLEXPEN [place]) Inject 60 Units into the skin at bedtime.      Allergies:   Patient has no known allergies.   Social History   Socioeconomic History  . Marital status: Divorced    Spouse name: Not on file  . Number of children: Not on file  . Years of education: Not on file  . Highest education level: Not on file  Occupational History  . Not on file  Tobacco Use  . Smoking status: Never Smoker  . Smokeless tobacco: Never Used  Substance and Sexual Activity  . Alcohol use: Yes    Alcohol/week: 1.0 standard drinks    Types: 1 Cans of beer per week    Comment: socially  . Drug use: No  . Sexual activity: Not on file   Other Topics Concern  . Not on file  Social History Narrative  . Not on file   Social Determinants of Health   Financial Resource Strain:   . Difficulty of Paying Living Expenses: Not on file  Food Insecurity:   . Worried About Charity fundraiser in the Last Year: Not on file  . Ran Out of Food in the Last Year: Not on file  Transportation Needs:   . Lack of Transportation (Medical): Not on file  . Lack of Transportation (Non-Medical): Not on file  Physical Activity:   . Days of Exercise per Week:  Not on file  . Minutes of Exercise per Session: Not on file  Stress:   . Feeling of Stress : Not on file  Social Connections:   . Frequency of Communication with Friends and Family: Not on file  . Frequency of Social Gatherings with Friends and Family: Not on file  . Attends Religious Services: Not on file  . Active Member of Clubs or Organizations: Not on file  . Attends Archivist Meetings: Not on file  . Marital Status: Not on file     Family History: The patient's family history includes Diabetes in his father and mother; Heart disease in his father and mother.  ROS:   Review of Systems  Constitution: Negative for decreased appetite, fever and weight gain.  HENT: Negative for congestion, ear discharge, hoarse voice and sore throat.   Eyes: Negative for discharge, redness, vision loss in right eye and visual halos.  Cardiovascular: Negative for chest pain, dyspnea on exertion, leg swelling, orthopnea and palpitations.  Respiratory: Negative for cough, hemoptysis, shortness of breath and snoring.   Endocrine: Negative for heat intolerance and polyphagia.  Hematologic/Lymphatic: Negative for bleeding problem. Does not bruise/bleed easily.  Skin: Negative for flushing, nail changes, rash and suspicious lesions.  Musculoskeletal: Negative for arthritis, joint pain, muscle cramps, myalgias, neck pain and stiffness.  Gastrointestinal: Negative for abdominal pain, bowel  incontinence, diarrhea and excessive appetite.  Genitourinary: Negative for decreased libido, genital sores and incomplete emptying.  Neurological: Negative for brief paralysis, focal weakness, headaches and loss of balance.  Psychiatric/Behavioral: Negative for altered mental status, depression and suicidal ideas.  Allergic/Immunologic: Negative for HIV exposure and persistent infections.    EKGs/Labs/Other Studies Reviewed:    The following studies were reviewed today:   EKG:  The ekg ordered today demonstrates   Recent Labs: 06/28/2018: Hemoglobin 12.0; Platelets 301 04/17/2019: Magnesium 1.9 04/25/2019: BUN 23; Creatinine, Ser 1.79; Potassium 4.7; Sodium 135  Recent Lipid Panel    Component Value Date/Time   CHOL 191 09/27/2018 1010   TRIG 193 (H) 09/27/2018 1010   HDL 34 (L) 09/27/2018 1010   CHOLHDL 5.6 09/27/2018 1010   VLDL 39 09/27/2018 1010   LDLCALC 118 (H) 09/27/2018 1010    Physical Exam:    VS:  BP (!) 152/84 (BP Location: Left Arm, Patient Position: Sitting, Cuff Size: Large)   Ht 5\' 11"  (1.803 m)   Wt (!) 377 lb (171 kg)   BMI 52.58 kg/m     Wt Readings from Last 3 Encounters:  05/13/19 (!) 377 lb (171 kg)  04/17/19 (!) 372 lb (168.7 kg)  03/20/19 (!) 374 lb (169.6 kg)     GEN: Well nourished, well developed in no acute distress HEENT: Normal NECK: No JVD; No carotid bruits LYMPHATICS: No lymphadenopathy CARDIAC: S1S2 noted,RRR, no murmurs, rubs, gallops RESPIRATORY:  Clear to auscultation without rales, wheezing or rhonchi  ABDOMEN: Soft, non-tender, non-distended, +bowel sounds, no guarding. EXTREMITIES: No edema, No cyanosis, no clubbing MUSCULOSKELETAL:  No edema; No deformity  SKIN: Warm and dry NEUROLOGIC:  Alert and oriented x 3, non-focal PSYCHIATRIC:  Normal affect, good insight  ASSESSMENT:    1. Dilated cardiomyopathy ([place])   2. Chronic systolic congestive heart failure ([place])   3. Depressed left ventricular systolic function   4.  Obstructive sleep apnea   5. Hypertensive heart disease with chronic systolic congestive heart failure ([place])    PLAN:    1.  His blood pressure is not yet controlled on his current  medication regimen.  He reports that he is compliant with his medication as well as his improving his diet and decreasing his salt intake.  Going to continue his carvedilol 25 mg twice daily, hydralazine 100 mg twice daily, Aldactone 25 mg daily, Imdur 60 mg daily Lasix 40 mg a.m. and 20 mg p.m.  I will cautiously start patient on losartan 50 mg daily and closely follow his electrolytes and kidney function.  2.  Unfortunately for his dilated cardiomyopathy he is not yet on GDMT.  He is on a beta-blocker (Coreg 25 mg twice daily), mineralocorticoid (Aldactone 25 mg daily).  Due to his Delene Loll being denied by his insurance company, I am going to start the patient on losartan 50 mg daily (also cautiously watching his creatinine and his potassium) of also asked him to discuss this with his nephrologist at his next upcoming visit in February.  In addition he was started on Farxiga but this medication was not covered with preauthorization denied and he is unable to afford it.  For now we will investigate any patient assistance.  He will also be following with our [facility] heart failure clinic.  3.  Morbid obesity-the patient understands the need to lose weight with diet and exercise. We have discussed specific strategies for this..  4.  Hyperlipidemia, continue statin  5.  Blood work in 2 weeks  the patient is in agreement with the above plan. The patient left the office in stable condition.  The patient will follow up in   Medication Adjustments/Labs and Tests Ordered: Current medicines are reviewed at length with the patient today.  Concerns regarding medicines are outlined above.  Orders Placed This Encounter  Procedures  . Basic Metabolic Panel (BMET)  . Magnesium   Meds ordered this encounter  Medications  .  losartan (COZAAR) 50 MG tablet    Sig: Take 1 tablet (50 mg total) by mouth daily.    Dispense:  90 tablet    Refill:  1    Patient Instructions  Medication Instructions:  Your physician has recommended you make the following change in your medication:   START: Losartan 50 mg Take 1 tab daily  *If you need a refill on your cardiac medications before your next appointment, please call your pharmacy*  Lab Work: Your physician recommends that you return for lab work in: 2 weeks BMP,Magnesium  If you have labs (blood work) drawn today and your tests are completely normal, you will receive your results only by: Marland Kitchen MyChart Message (if you have MyChart) OR . A paper copy in the mail If you have any lab test that is abnormal or we need to change your treatment, we will call you to review the results.  Testing/Procedures: None  Follow-Up: At [facility], you and your health needs are our priority.  As part of our continuing mission to provide you with exceptional heart care, we have created designated Provider Care Teams.  These Care Teams include your primary Cardiologist (physician) and Advanced Practice Providers (APPs -  Physician Assistants and Nurse Practitioners) who all work together to provide you with the care you need, when you need it.  Your next appointment:   3 month(s)  The format for your next appointment:   In Person  Provider:   Berniece Salines, DO  Other Instructions Losartan Tablets What is this medicine? LOSARTAN (loe SAR tan) is an angiotensin II receptor blocker, also known as an ARB. It treats high blood pressure. It can slow kidney  damage in some patients. It may also be used to lower the risk of stroke. This medicine may be used for other purposes; ask your health care provider or pharmacist if you have questions. COMMON BRAND NAME(S): Cozaar What should I tell my health care provider before I take this medicine? They need to know if you have any of these  conditions:  heart failure  kidney or liver disease  an unusual or allergic reaction to losartan, other medicines, foods, dyes, or preservatives  pregnant or trying to get pregnant  breast-feeding How should I use this medicine? Take this drug by mouth. Take it as directed on the prescription label at the same time every day. You can take it with or without food. If it upsets your stomach, take it with food. Keep taking it unless your health care provider tells you to stop. Talk to your health care provider about the use of this drug in children. While it may be prescribed for children as young as 6 for selected conditions, precautions do apply. Overdosage: If you think you have taken too much of this medicine contact a poison control center or emergency room at once. NOTE: This medicine is only for you. Do not share this medicine with others. What if I miss a dose? If you miss a dose, take it as soon as you can. If it is almost time for your next dose, take only that dose. Do not take double or extra doses. What may interact with this medicine?  blood pressure medicines  diuretics, especially triamterene, spironolactone, or amiloride  fluconazole  NSAIDs, medicines for pain and inflammation, like ibuprofen or naproxen  potassium salts or potassium supplements  rifampin This list may not describe all possible interactions. Give your health care provider a list of all the medicines, herbs, non-prescription drugs, or dietary supplements you use. Also tell them if you smoke, drink alcohol, or use illegal drugs. Some items may interact with your medicine. What should I watch for while using this medicine? Visit your doctor or health care professional for regular checks on your progress. Check your blood pressure as directed. Ask your doctor or health care professional what your blood pressure should be and when you should contact him or her. Call your doctor or health care professional  if you notice an irregular or fast heart beat. Women should inform their doctor if they wish to become pregnant or think they might be pregnant. There is a potential for serious side effects to an unborn child, particularly in the second or third trimester. Talk to your health care professional or pharmacist for more information. You may get drowsy or dizzy. Do not drive, use machinery, or do anything that needs mental alertness until you know how this drug affects you. Do not stand or sit up quickly, especially if you are an older patient. This reduces the risk of dizzy or fainting spells. Alcohol can make you more drowsy and dizzy. Avoid alcoholic drinks. Avoid salt substitutes unless you are told otherwise by your doctor or health care professional. Do not treat yourself for coughs, colds, or pain while you are taking this medicine without asking your doctor or health care professional for advice. Some ingredients may increase your blood pressure. What side effects may I notice from receiving this medicine? Side effects that you should report to your doctor or health care professional as soon as possible:  confusion, dizziness, light headedness or fainting spells  decreased amount of urine passed  difficulty  breathing or swallowing, hoarseness, or tightening of the throat  fast or irregular heart beat, palpitations, or chest pain  skin rash, itching  swelling of your face, lips, tongue, hands, or feet Side effects that usually do not require medical attention (report to your doctor or health care professional if they continue or are bothersome):  cough  decreased sexual function or desire  headache  nasal congestion or stuffiness  nausea or stomach pain  sore or cramping muscles This list may not describe all possible side effects. Call your doctor for medical advice about side effects. You may report side effects to FDA at 1-800-FDA-1088. Where should I keep my medicine? Keep  out of the reach of children and pets. Store at room temperature between 15 and 30 degrees C (59 and 86 degrees F). Protect from light. Keep the container tightly closed. Throw away any unused drug after the expiration date. NOTE: This sheet is a summary. It may not cover all possible information. If you have questions about this medicine, talk to your doctor, pharmacist, or health care provider.  2020 Elsevier/Gold Standard (2018-11-28 12:12:28)      Adopting a Healthy Lifestyle.  Know what a healthy weight is for you (roughly BMI <25) and aim to maintain this   Aim for 7+ servings of fruits and vegetables daily   65-80+ fluid ounces of water or unsweet tea for healthy kidneys   Limit to max 1 drink of alcohol per day; avoid smoking/tobacco   Limit animal fats in diet for cholesterol and heart health - choose grass fed whenever available   Avoid highly processed foods, and foods high in saturated/trans fats   Aim for low stress - take time to unwind and care for your mental health   Aim for 150 min of moderate intensity exercise weekly for heart health, and weights twice weekly for bone health   Aim for 7-9 hours of sleep daily   When it comes to diets, agreement about the perfect plan isnt easy to find, even among the experts. Experts at the [place] developed an idea known as the Healthy Eating Plate. Just imagine a plate divided into logical, healthy portions.   The emphasis is on diet quality:   Load up on vegetables and fruits - one-half of your plate: Aim for color and variety, and remember that potatoes dont count.   Go for whole grains - one-quarter of your plate: Whole wheat, barley, wheat berries, quinoa, oats, brown rice, and foods made with them. If you want pasta, go with whole wheat pasta.   Protein power - one-quarter of your plate: Fish, chicken, beans, and nuts are all healthy, versatile protein sources. Limit red meat.   The diet,  however, does go beyond the plate, offering a few other suggestions.   Use healthy plant oils, such as olive, canola, soy, corn, sunflower and peanut. Check the labels, and avoid partially hydrogenated oil, which have unhealthy trans fats.   If youre thirsty, drink water. Coffee and tea are good in moderation, but skip sugary drinks and limit milk and dairy products to one or two daily servings.   The type of carbohydrate in the diet is more important than the amount. Some sources of carbohydrates, such as vegetables, fruits, whole grains, and beans-are healthier than others.   Finally, stay active  Signed, Berniece Salines, DO  05/13/2019 10:16 AM    [place]

## 2019-05-13 NOTE — Patient Instructions (Signed)
Medication Instructions:  Your physician has recommended you make the following change in your medication:   START: Losartan 50 mg Take 1 tab daily  *If you need a refill on your cardiac medications before your next appointment, please call your pharmacy*  Lab Work: Your physician recommends that you return for lab work in: 2 weeks BMP,Magnesium  If you have labs (blood work) drawn today and your tests are completely normal, you will receive your results only by: Marland Kitchen MyChart Message (if you have MyChart) OR . A paper copy in the mail If you have any lab test that is abnormal or we need to change your treatment, we will call you to review the results.  Testing/Procedures: None  Follow-Up: At [facility], you and your health needs are our priority.  As part of our continuing mission to provide you with exceptional heart care, we have created designated Provider Care Teams.  These Care Teams include your primary Cardiologist (physician) and Advanced Practice Providers (APPs -  Physician Assistants and Nurse Practitioners) who all work together to provide you with the care you need, when you need it.  Your next appointment:   3 month(s)  The format for your next appointment:   In Person  Provider:   Berniece Salines, DO  Other Instructions Losartan Tablets What is this medicine? LOSARTAN (loe SAR tan) is an angiotensin II receptor blocker, also known as an ARB. It treats high blood pressure. It can slow kidney damage in some patients. It may also be used to lower the risk of stroke. This medicine may be used for other purposes; ask your health care provider or pharmacist if you have questions. COMMON BRAND NAME(S): Cozaar What should I tell my health care provider before I take this medicine? They need to know if you have any of these conditions:  heart failure  kidney or liver disease  an unusual or allergic reaction to losartan, other medicines, foods, dyes, or  preservatives  pregnant or trying to get pregnant  breast-feeding How should I use this medicine? Take this drug by mouth. Take it as directed on the prescription label at the same time every day. You can take it with or without food. If it upsets your stomach, take it with food. Keep taking it unless your health care provider tells you to stop. Talk to your health care provider about the use of this drug in children. While it may be prescribed for children as young as 6 for selected conditions, precautions do apply. Overdosage: If you think you have taken too much of this medicine contact a poison control center or emergency room at once. NOTE: This medicine is only for you. Do not share this medicine with others. What if I miss a dose? If you miss a dose, take it as soon as you can. If it is almost time for your next dose, take only that dose. Do not take double or extra doses. What may interact with this medicine?  blood pressure medicines  diuretics, especially triamterene, spironolactone, or amiloride  fluconazole  NSAIDs, medicines for pain and inflammation, like ibuprofen or naproxen  potassium salts or potassium supplements  rifampin This list may not describe all possible interactions. Give your health care provider a list of all the medicines, herbs, non-prescription drugs, or dietary supplements you use. Also tell them if you smoke, drink alcohol, or use illegal drugs. Some items may interact with your medicine. What should I watch for while using this medicine? Visit  your doctor or health care professional for regular checks on your progress. Check your blood pressure as directed. Ask your doctor or health care professional what your blood pressure should be and when you should contact him or her. Call your doctor or health care professional if you notice an irregular or fast heart beat. Women should inform their doctor if they wish to become pregnant or think they might be  pregnant. There is a potential for serious side effects to an unborn child, particularly in the second or third trimester. Talk to your health care professional or pharmacist for more information. You may get drowsy or dizzy. Do not drive, use machinery, or do anything that needs mental alertness until you know how this drug affects you. Do not stand or sit up quickly, especially if you are an older patient. This reduces the risk of dizzy or fainting spells. Alcohol can make you more drowsy and dizzy. Avoid alcoholic drinks. Avoid salt substitutes unless you are told otherwise by your doctor or health care professional. Do not treat yourself for coughs, colds, or pain while you are taking this medicine without asking your doctor or health care professional for advice. Some ingredients may increase your blood pressure. What side effects may I notice from receiving this medicine? Side effects that you should report to your doctor or health care professional as soon as possible:  confusion, dizziness, light headedness or fainting spells  decreased amount of urine passed  difficulty breathing or swallowing, hoarseness, or tightening of the throat  fast or irregular heart beat, palpitations, or chest pain  skin rash, itching  swelling of your face, lips, tongue, hands, or feet Side effects that usually do not require medical attention (report to your doctor or health care professional if they continue or are bothersome):  cough  decreased sexual function or desire  headache  nasal congestion or stuffiness  nausea or stomach pain  sore or cramping muscles This list may not describe all possible side effects. Call your doctor for medical advice about side effects. You may report side effects to FDA at 1-800-FDA-1088. Where should I keep my medicine? Keep out of the reach of children and pets. Store at room temperature between 15 and 30 degrees C (59 and 86 degrees F). Protect from light.  Keep the container tightly closed. Throw away any unused drug after the expiration date. NOTE: This sheet is a summary. It may not cover all possible information. If you have questions about this medicine, talk to your doctor, pharmacist, or health care provider.  2020 [employer]/Gold Standard (2018-11-28 12:12:28)

## 2019-05-28 ENCOUNTER — Encounter (HOSPITAL_COMMUNITY): Payer: Medicaid Other | Admitting: Cardiology

## 2019-06-19 ENCOUNTER — Telehealth: Payer: Self-pay | Admitting: Cardiology

## 2019-06-19 MED ORDER — HYDRALAZINE HCL 50 MG PO TABS: 100.0000 mg | ORAL_TABLET | Freq: Every day | ORAL | 3 refills | Status: DC

## 2019-06-19 NOTE — Telephone Encounter (Signed)
Hydralazine refilled. 90 day supply with 3 refills sent to preferred pharmacy.

## 2019-06-19 NOTE — Telephone Encounter (Signed)
New message   Patient needs a new prescription for hydrALAZINE (APRESOLINE) 50 MG tablet sent to [id number] - [place], [place] - [address] AT [address]

## 2019-06-21 ENCOUNTER — Telehealth: Payer: Self-pay | Admitting: Cardiology

## 2019-06-21 NOTE — Telephone Encounter (Signed)
Patient calling the office for samples of medication:   1.  What medication and dosage are you requesting samples for? hydrALAZINE (APRESOLINE) 50 MG tablet  2.  Are you currently out of this medication? Yes   Patient is calling requesting an 18 day supply of samples. He states when he tried to get it filled yesterday insurance would not cover it and they informed him it would be another 18 days before they could. Please advise.

## 2019-07-22 ENCOUNTER — Other Ambulatory Visit (HOSPITAL_COMMUNITY): Payer: Self-pay | Admitting: *Deleted

## 2019-08-08 ENCOUNTER — Ambulatory Visit (INDEPENDENT_AMBULATORY_CARE_PROVIDER_SITE_OTHER): Payer: Medicaid Other | Admitting: Cardiology

## 2019-08-08 ENCOUNTER — Other Ambulatory Visit: Payer: Self-pay

## 2019-08-08 ENCOUNTER — Encounter: Payer: Self-pay | Admitting: Cardiology

## 2019-08-08 VITALS — BP 140/100 | HR 79 | Ht 71.0 in | Wt 376.0 lb

## 2019-08-08 DIAGNOSIS — I420 Dilated cardiomyopathy: Secondary | ICD-10-CM | POA: Diagnosis not present

## 2019-08-08 DIAGNOSIS — Z9114 Patient's other noncompliance with medication regimen: Secondary | ICD-10-CM

## 2019-08-08 DIAGNOSIS — E0800 Diabetes mellitus due to underlying condition with hyperosmolarity without nonketotic hyperglycemic-hyperosmolar coma (NKHHC): Secondary | ICD-10-CM

## 2019-08-08 DIAGNOSIS — I519 Heart disease, unspecified: Secondary | ICD-10-CM

## 2019-08-08 DIAGNOSIS — I110 Hypertensive heart disease with heart failure: Secondary | ICD-10-CM

## 2019-08-08 DIAGNOSIS — I5022 Chronic systolic (congestive) heart failure: Secondary | ICD-10-CM

## 2019-08-08 DIAGNOSIS — Z91148 Patient's other noncompliance with medication regimen for other reason: Secondary | ICD-10-CM

## 2019-08-08 DIAGNOSIS — E782 Mixed hyperlipidemia: Secondary | ICD-10-CM

## 2019-08-08 DIAGNOSIS — Z794 Long term (current) use of insulin: Secondary | ICD-10-CM

## 2019-08-08 DIAGNOSIS — Z6841 Body Mass Index (BMI) 40.0 and over, adult: Secondary | ICD-10-CM

## 2019-08-08 DIAGNOSIS — I10 Essential (primary) hypertension: Secondary | ICD-10-CM

## 2019-08-08 DIAGNOSIS — I5020 Unspecified systolic (congestive) heart failure: Secondary | ICD-10-CM

## 2019-08-08 MED ORDER — HYDRALAZINE HCL 100 MG PO TABS: 100.0000 mg | ORAL_TABLET | Freq: Two times a day (BID) | ORAL | 3 refills | Status: DC

## 2019-08-08 NOTE — Patient Instructions (Signed)
Medication Instructions:  Your physician has recommended you make the following change in your medication:   Stop taking Potassium Chloride Start taking Hydralazine 100 mg twice daily.  *If you need a refill on your cardiac medications before your next appointment, please call your pharmacy*   Lab Work: Your physician recommends that you have your labs done today to check your BMET and Magnesium.  If you have labs (blood work) drawn today and your tests are completely normal, you will receive your results only by: Marland Kitchen MyChart Message (if you have MyChart) OR . A paper copy in the mail If you have any lab test that is abnormal or we need to change your treatment, we will call you to review the results.   Testing/Procedures: None ordered   Follow-Up: At [facility], you and your health needs are our priority.  As part of our continuing mission to provide you with exceptional heart care, we have created designated Provider Care Teams.  These Care Teams include your primary Cardiologist (physician) and Advanced Practice Providers (APPs -  Physician Assistants and Nurse Practitioners) who all work together to provide you with the care you need, when you need it.  We recommend signing up for the patient portal called "MyChart".  Sign up information is provided on this After Visit Summary.  MyChart is used to connect with patients for Virtual Visits (Telemedicine).  Patients are able to view lab/test results, encounter notes, upcoming appointments, etc.  Non-urgent messages can be sent to your provider as well.   To learn more about what you can do with MyChart, go to [email or web address].    Your next appointment:   4 month(s)  The format for your next appointment:   In Person  Provider:   Berniece Salines, DO   Other Instructions   Bring your blood pressure cuff and your medications at your next visit.  We put a referral in for Dr. Aundra Dubin.

## 2019-08-08 NOTE — Progress Notes (Signed)
Cardiology Office Note:    Date:  08/08/2019   ID:  Dustin Mcclain, DOB [birth year], MRN [id number]  PCP:  Glendon Axe, MD  Cardiologist:  Berniece Salines, DO  Electrophysiologist:  None   Referring MD: Glendon Axe, MD   Chief Complaint  Patient presents with  . Follow-up   History of Present Illness:    Dustin Mcclain is a 54 y.o. male with a hx of history of hypertension, CKD stage III, hyperlipidemia, systolic heart failure, dilated cardiomyopathy EF 35 to 40%, obesity.  I initially saw the patient March 29, 2019 at the time when taking off Entresto due to cost and his medication was denied by his insurance company to be covered.During his visit, his blood pressure was elevated and we discussed strategies for improving his blood pressure. His medication regimen included carvedilol 25 mg twice a days, furosemide 40 mg daily in the morning and 20 mg at night, this is all by mononitrate 30 mg daily, spironolactone 25 mg daily and I increase his hydralazine from 50 mg twice a day to 50 mg every 8 hours.   In addition an echocardiogram was ordered to assess his LV function.  Last saw the patient April 17, 2019.  During that time we will discuss his results from his echocardiogram.  I also started the patient on Farxiga milligrams daily, isosorbide mononitrate was restarted since he had not been taking his medication.  He reported that he was unable to take his hydralazine 3 times a day therefore hydralazine was increased to 100 mg twice daily he was maintained on his Coreg 25 mg twice daily, Aldactone 25 mg daily his Lasix 40 mg in the morning and 20 mg in the p.m.  In the interim the patient tells me that his insurance company had denied his Wilder Glade despite our attempt to obtain preauthorization.    He was seen on 05/13/2019.At that time his blood pressure was elevated as I questioned his diet again and advised him to decrease his salt intake. His blood pressure regimen that day was  atenolol 25 mg twice a day, hydralazine 100 mg twice a day, Aldactone 25 mg daily, Imdur 60 mg daily, Lasix 40 mg in a.m. and 20 mg in the p.m. Cautiously started the patient on losartan 50 mg daily.  Today he tells me that he has been taking his hydralazine 50 mg twice a day-which was not his prescribed dose. He explained that this was due to insurance problems.  He denies any chest pain, he is at his baseline shortness of breath.  Past Medical History:  Diagnosis Date  . Allergic rhinitis, cause unspecified   . Anemia in stage 3 chronic kidney disease    sees dr Arty Baumgartner every 3 months  . Anxiety state, unspecified   . Asthma    mild  . Benign neoplasm of descending colon 04/15/2014  . Cardiomyopathy ([place]) 07/11/2008   Qualifier: Diagnosis of  By: Annamaria Boots MD, Clinton D   . CHF 07/27/2008   Qualifier: Diagnosis of  By: Annamaria Boots MD, Clinton D   . CHF (congestive heart failure) ([place])    right side  . Chronic systolic congestive heart failure ([place]) 10/22/2015  . Depressive disorder, not elsewhere classified   . Diabetes mellitus without complication ([place])   . DM 10/23/2009   Qualifier: Diagnosis of  By: Annamaria Boots MD, Clinton D   . GERD (gastroesophageal reflux disease)   . Insomnia 06/06/2008   Qualifier: Diagnosis of  By: Lockie Pares CMA, [name]    .  Insomnia, unspecified   . Joint effusion 08/06/2013  . Mixed hyperlipidemia 10/03/2017  . Morbid obesity ([place])   . Obstructive sleep apnea 06/06/2008   NPSG 08/15/16   AHI 10.8/ hr, desaturation to 83%, body weight 365 lbs- requalified for CPAP   . Other primary cardiomyopathies   . Right knee pain 08/06/2013  . Seasonal allergic rhinitis 07/27/2008   Symptom patterns suggests primary sensitivity to spring grass pollens. Environmental precautions discussed. Potential candidate for sublingual therapy.    . Shortness of breath 10/19/2015  . Tendinitis    left wrist and elbow and left knee  . Unspecified disorder resulting from impaired renal function   .  Unspecified essential hypertension   . Unspecified sleep apnea    cpap setting of 22 or 23    Past Surgical History:  Procedure Laterality Date  . APPENDECTOMY    . COLONOSCOPY WITH PROPOFOL N/A 04/15/2014   Procedure: COLONOSCOPY WITH PROPOFOL;  Surgeon: Irene Shipper, MD;  Location: [facility];  Service: Endoscopy;  Laterality: N/A;  . PATELLAR TENDON REPAIR     left    Current Medications: Current Meds  Medication Sig  . albuterol (PROVENTIL HFA;VENTOLIN HFA) 108 (90 BASE) MCG/ACT inhaler Inhale 2 puffs into the lungs every 4 (four) hours as needed for wheezing or shortness of breath.  Marland Kitchen atorvastatin (LIPITOR) 10 MG tablet Take 10 mg by mouth daily.  . carvedilol (COREG) 25 MG tablet Take 25 mg by mouth 2 (two) times daily with a meal.   . cholecalciferol (VITAMIN D) 1000 UNITS tablet Take 3,000 Units by mouth every morning.   . colchicine 0.6 MG tablet Take 0.6 mg by mouth as needed.   . furosemide (LASIX) 40 MG tablet Take 1 tablet (40 mg total) by mouth every morning AND 0.5 tablets (20 mg total) every evening.  . hydrALAZINE (APRESOLINE) 100 MG tablet Take 1 tablet (100 mg total) by mouth 2 (two) times daily.  . insulin regular human CONCENTRATED (HUMULIN R) 500 UNIT/ML kwikpen Use as directed. Max daily dose is 300 units daily.  . isosorbide mononitrate (IMDUR) 60 MG 24 hr tablet Take 1 tablet (60 mg total) by mouth daily.  . Liraglutide (VICTOZA Goodnight) Inject into the skin. 1.2 mg at bedtime  . losartan (COZAAR) 50 MG tablet Take 1 tablet (50 mg total) by mouth daily.  . Magnesium 250 MG TABS Take 1 tablet by mouth every morning.   Marland Kitchen spironolactone (ALDACTONE) 25 MG tablet Take 25 mg by mouth 2 (two) times daily.  . Testosterone (ANDROGEL) 20.25 MG/1.25GM (1.62%) GEL Place 1 application onto the skin every morning.   . [DISCONTINUED] hydrALAZINE (APRESOLINE) 50 MG tablet Take 2 tablets (100 mg total) by mouth daily.  . [DISCONTINUED] potassium chloride SA (K-DUR,KLOR-CON) 20 MEQ  tablet Take 20 mEq by mouth 2 (two) times daily.     Allergies:   Patient has no known allergies.   Social History   Socioeconomic History  . Marital status: Divorced    Spouse name: Not on file  . Number of children: Not on file  . Years of education: Not on file  . Highest education level: Not on file  Occupational History  . Not on file  Tobacco Use  . Smoking status: Never Smoker  . Smokeless tobacco: Never Used  Substance and Sexual Activity  . Alcohol use: Yes    Alcohol/week: 1.0 standard drinks    Types: 1 Cans of beer per week    Comment: socially  .  Drug use: No  . Sexual activity: Not on file  Other Topics Concern  . Not on file  Social History Narrative  . Not on file   Social Determinants of Health   Financial Resource Strain:   . Difficulty of Paying Living Expenses:   Food Insecurity:   . Worried About Charity fundraiser in the Last Year:   . Arboriculturist in the Last Year:   Transportation Needs:   . Film/video editor (Medical):   Marland Kitchen Lack of Transportation (Non-Medical):   Physical Activity:   . Days of Exercise per Week:   . Minutes of Exercise per Session:   Stress:   . Feeling of Stress :   Social Connections:   . Frequency of Communication with Friends and Family:   . Frequency of Social Gatherings with Friends and Family:   . Attends Religious Services:   . Active Member of Clubs or Organizations:   . Attends Archivist Meetings:   Marland Kitchen Marital Status:      Family History: The patient's family history includes Diabetes in his father and mother; Heart disease in his father and mother.  ROS:   Review of Systems  Constitution: Negative for decreased appetite, fever and weight gain.  HENT: Negative for congestion, ear discharge, hoarse voice and sore throat.   Eyes: Negative for discharge, redness, vision loss in right eye and visual halos.  Cardiovascular: Negative for chest pain, dyspnea on exertion, leg swelling,  orthopnea and palpitations.  Respiratory: Negative for cough, hemoptysis, shortness of breath and snoring.   Endocrine: Negative for heat intolerance and polyphagia.  Hematologic/Lymphatic: Negative for bleeding problem. Does not bruise/bleed easily.  Skin: Negative for flushing, nail changes, rash and suspicious lesions.  Musculoskeletal: Negative for arthritis, joint pain, muscle cramps, myalgias, neck pain and stiffness.  Gastrointestinal: Negative for abdominal pain, bowel incontinence, diarrhea and excessive appetite.  Genitourinary: Negative for decreased libido, genital sores and incomplete emptying.  Neurological: Negative for brief paralysis, focal weakness, headaches and loss of balance.  Psychiatric/Behavioral: Negative for altered mental status, depression and suicidal ideas.  Allergic/Immunologic: Negative for HIV exposure and persistent infections.    EKGs/Labs/Other Studies Reviewed:    The following studies were reviewed today:   EKG: None today  TTE IMPRESSIONS March 22, 2019 1. Left ventricular ejection fraction, by visual estimation, is 35 to  40%. The left ventricle has moderately decreased function. Mildly dilated  left ventricle with global hypokinesis. Left ventricular septal wall  thickness was severely increased.  Moderately increased left ventricular posterior wall thickness. The left  ventricular hypertrophy is asymmetrical.  2. Left ventricular diastolic parameters are consistent with Grade II  diastolic dysfunction (pseudonormalization).  3. Global right ventricle has normal systolic function.The right  ventricular size is moderately enlarged. No increase in right ventricular  wall thickness.  4. Left atrial size was normal.  5. Right atrial size was normal.  6. The mitral valve is normal in structure. Trace mitral valve  regurgitation. No evidence of mitral stenosis.  7. The tricuspid valve is normal in structure. Tricuspid valve    regurgitation is not demonstrated.  8. The aortic valve is normal in structure. Aortic valve regurgitation is  not visualized. No evidence of aortic valve sclerosis or stenosis.  9. The pulmonic valve was normal in structure. Pulmonic valve  regurgitation is not visualized.  10. Normal pulmonary artery systolic pressure.   Recent Labs: 04/17/2019: Magnesium 1.9 04/25/2019: BUN 23; Creatinine, Ser 1.79; Potassium  4.7; Sodium 135  Recent Lipid Panel    Component Value Date/Time   CHOL 191 09/27/2018 1010   TRIG 193 (H) 09/27/2018 1010   HDL 34 (L) 09/27/2018 1010   CHOLHDL 5.6 09/27/2018 1010   VLDL 39 09/27/2018 1010   LDLCALC 118 (H) 09/27/2018 1010    Physical Exam:    VS:  BP (!) 140/100 (BP Location: Left Arm, Patient Position: Sitting, Cuff Size: Large)   Pulse 79   Ht 5\' 11"  (1.803 m)   Wt (!) 376 lb (170.6 kg)   SpO2 95%   BMI 52.44 kg/m     Wt Readings from Last 3 Encounters:  08/08/19 (!) 376 lb (170.6 kg)  05/13/19 (!) 377 lb (171 kg)  04/17/19 (!) 372 lb (168.7 kg)     GEN: Obese male, well nourished, well developed in no acute distress HEENT: Normal NECK: No JVD; No carotid bruits LYMPHATICS: No lymphadenopathy CARDIAC: S1S2 noted,RRR, no murmurs, rubs, gallops RESPIRATORY:  Clear to auscultation without rales, wheezing or rhonchi  ABDOMEN: Soft, non-tender, non-distended, +bowel sounds, no guarding. EXTREMITIES: No edema, No cyanosis, no clubbing MUSCULOSKELETAL:  No deformity  SKIN: Warm and dry NEUROLOGIC:  Alert and oriented x 3, non-focal PSYCHIATRIC:  Normal affect, good insight  ASSESSMENT:    1. Dilated cardiomyopathy ([place])   2. Mixed hyperlipidemia   3. Depressed left ventricular systolic function   4. Essential hypertension   5. Morbid obesity with BMI of 50.0-59.9, adult ([place])   6. Noncompliance with medication regimen   7. HFrEF (heart failure with reduced ejection fraction) ([place])   8. Hypertensive heart disease with chronic  systolic congestive heart failure ([place])   9. Uncontrolled hypertension   10. Diabetes mellitus due to underlying condition with hyperosmolarity without coma, with long-term current use of insulin ([facility])    PLAN:     1. His blood pressure is not yet at goal. With his self changes in his blood pressure medication dose and I question his compliance. I also have encouraged the patient to decrease his salt intake. We discussed heavily the importance of taking his medications and appropriate dosing. I am going to reorder his hydralazine at 100 mg twice a day.he will continue Aldactone 25 mg daily, Coreg 25 mg twice daily, Imdur 60 mg daily, Lasix 40 mg in the a.m. and 20 mg in the p.m., losartan 50 mg daily.   2. For his dilated nonischemic cardiomyopathy he is currently on carvedilol 25 mg twice a day, Aldactone 25 mg daily, losartan 50 mg. Delene Loll and Wilder Glade were not approved by the patient insurance company despite attempts for [place] to get prior authorization. He still has not followed up with our advanced heart failure clinic I have sent a referral again today. His last echo was March 22, 2019 which showed EF of 35 to 40%.  3. Hyperlipidemia continue patient on his Lipitor.  4. Diabetes mellitus-insulin per endocrinologist.  5. CKD-he follows with his nephrologist and was recently seen in February.  6. Morbid obesity-I discussed with the patient today the possibility of potential bariatric surgery but he tells me that he has tried to do so but this would not be covered by his insurance company. Therefore the patient understands the need to lose weight with diet and exercise. We have discussed specific strategies for this.  7. Blood work will be done today to assess electrolytes and kidney function.  The patient is in agreement with the above plan. The patient left the office in  stable condition.  The patient will follow up in   Medication Adjustments/Labs and Tests Ordered: Current medicines  are reviewed at length with the patient today.  Concerns regarding medicines are outlined above.  Orders Placed This Encounter  Procedures  . Basic metabolic panel  . Magnesium  . AMB referral to CHF clinic   Meds ordered this encounter  Medications  . hydrALAZINE (APRESOLINE) 100 MG tablet    Sig: Take 1 tablet (100 mg total) by mouth 2 (two) times daily.    Dispense:  180 tablet    Refill:  3    Patient Instructions  Medication Instructions:  Your physician has recommended you make the following change in your medication:   Stop taking Potassium Chloride Start taking Hydralazine 100 mg twice daily.  *If you need a refill on your cardiac medications before your next appointment, please call your pharmacy*   Lab Work: Your physician recommends that you have your labs done today to check your BMET and Magnesium.  If you have labs (blood work) drawn today and your tests are completely normal, you will receive your results only by: Marland Kitchen MyChart Message (if you have MyChart) OR . A paper copy in the mail If you have any lab test that is abnormal or we need to change your treatment, we will call you to review the results.   Testing/Procedures: None ordered   Follow-Up: At [facility], you and your health needs are our priority.  As part of our continuing mission to provide you with exceptional heart care, we have created designated Provider Care Teams.  These Care Teams include your primary Cardiologist (physician) and Advanced Practice Providers (APPs -  Physician Assistants and Nurse Practitioners) who all work together to provide you with the care you need, when you need it.  We recommend signing up for the patient portal called "MyChart".  Sign up information is provided on this After Visit Summary.  MyChart is used to connect with patients for Virtual Visits (Telemedicine).  Patients are able to view lab/test results, encounter notes, upcoming appointments, etc.  Non-urgent  messages can be sent to your provider as well.   To learn more about what you can do with MyChart, go to [email or web address].    Your next appointment:   4 month(s)  The format for your next appointment:   In Person  Provider:   Berniece Salines, DO   Other Instructions   Bring your blood pressure cuff and your medications at your next visit.  We put a referral in for Dr. Aundra Dubin.    Adopting a Healthy Lifestyle.  Know what a healthy weight is for you (roughly BMI <25) and aim to maintain this   Aim for 7+ servings of fruits and vegetables daily   65-80+ fluid ounces of water or unsweet tea for healthy kidneys   Limit to max 1 drink of alcohol per day; avoid smoking/tobacco   Limit animal fats in diet for cholesterol and heart health - choose grass fed whenever available   Avoid highly processed foods, and foods high in saturated/trans fats   Aim for low stress - take time to unwind and care for your mental health   Aim for 150 min of moderate intensity exercise weekly for heart health, and weights twice weekly for bone health   Aim for 7-9 hours of sleep daily   When it comes to diets, agreement about the perfect plan isnt easy to find, even among the experts. Experts at the  [employer] developed an idea known as the Healthy Eating Plate. Just imagine a plate divided into logical, healthy portions.   The emphasis is on diet quality:   Load up on vegetables and fruits - one-half of your plate: Aim for color and variety, and remember that potatoes dont count.   Go for whole grains - one-quarter of your plate: Whole wheat, barley, wheat berries, quinoa, oats, brown rice, and foods made with them. If you want pasta, go with whole wheat pasta.   Protein power - one-quarter of your plate: Fish, chicken, beans, and nuts are all healthy, versatile protein sources. Limit red meat.   The diet, however, does go beyond the plate, offering a few other  suggestions.   Use healthy plant oils, such as olive, canola, soy, corn, sunflower and peanut. Check the labels, and avoid partially hydrogenated oil, which have unhealthy trans fats.   If youre thirsty, drink water. Coffee and tea are good in moderation, but skip sugary drinks and limit milk and dairy products to one or two daily servings.   The type of carbohydrate in the diet is more important than the amount. Some sources of carbohydrates, such as vegetables, fruits, whole grains, and beans-are healthier than others.   Finally, stay active  Signed, Berniece Salines, DO  08/08/2019 12:04 PM    [facility]

## 2019-08-09 LAB — BASIC METABOLIC PANEL
BUN/Creatinine Ratio: 14 (ref 9–20)
BUN: 30 mg/dL — ABNORMAL HIGH (ref 6–24)
CO2: 22 mmol/L (ref 20–29)
Calcium: 10.2 mg/dL (ref 8.7–10.2)
Chloride: 103 mmol/L (ref 96–106)
Creatinine, Ser: 2.2 mg/dL — ABNORMAL HIGH (ref 0.76–1.27)
GFR calc Af Amer: 38 mL/min/{1.73_m2} — ABNORMAL LOW (ref >59)
GFR calc non Af Amer: 33 mL/min/{1.73_m2} — ABNORMAL LOW (ref >59)
Glucose: 201 mg/dL — ABNORMAL HIGH (ref 65–99)
Potassium: 5.5 mmol/L — ABNORMAL HIGH (ref 3.5–5.2)
Sodium: 140 mmol/L (ref 134–144)

## 2019-08-09 LAB — MAGNESIUM: Magnesium: 1.7 mg/dL (ref 1.6–2.3)

## 2019-10-02 ENCOUNTER — Encounter: Payer: Self-pay | Admitting: Cardiology

## 2019-10-04 ENCOUNTER — Telehealth (HOSPITAL_COMMUNITY): Payer: Self-pay | Admitting: Pharmacy Technician

## 2019-10-04 ENCOUNTER — Telehealth (HOSPITAL_COMMUNITY): Payer: Self-pay

## 2019-10-04 ENCOUNTER — Encounter (HOSPITAL_COMMUNITY): Payer: Self-pay | Admitting: Cardiology

## 2019-10-04 ENCOUNTER — Other Ambulatory Visit: Payer: Self-pay

## 2019-10-04 ENCOUNTER — Ambulatory Visit (HOSPITAL_COMMUNITY)
Admission: RE | Admit: 2019-10-04 | Discharge: 2019-10-04 | Disposition: A | Discharge status: Home / Self Care | Payer: Medicaid Other | Source: Ambulatory Visit | Attending: Cardiology | Admitting: Cardiology

## 2019-10-04 VITALS — BP 120/76 | HR 84 | Ht 71.0 in | Wt 375.4 lb

## 2019-10-04 DIAGNOSIS — I5022 Chronic systolic (congestive) heart failure: Secondary | ICD-10-CM | POA: Diagnosis present

## 2019-10-04 DIAGNOSIS — Z8249 Family history of ischemic heart disease and other diseases of the circulatory system: Secondary | ICD-10-CM | POA: Diagnosis not present

## 2019-10-04 DIAGNOSIS — Z794 Long term (current) use of insulin: Secondary | ICD-10-CM | POA: Insufficient documentation

## 2019-10-04 DIAGNOSIS — E1122 Type 2 diabetes mellitus with diabetic chronic kidney disease: Secondary | ICD-10-CM | POA: Insufficient documentation

## 2019-10-04 DIAGNOSIS — I130 Hypertensive heart and chronic kidney disease with heart failure and stage 1 through stage 4 chronic kidney disease, or unspecified chronic kidney disease: Secondary | ICD-10-CM | POA: Diagnosis not present

## 2019-10-04 DIAGNOSIS — E785 Hyperlipidemia, unspecified: Secondary | ICD-10-CM | POA: Diagnosis not present

## 2019-10-04 DIAGNOSIS — N183 Chronic kidney disease, stage 3 unspecified: Secondary | ICD-10-CM | POA: Diagnosis not present

## 2019-10-04 DIAGNOSIS — I429 Cardiomyopathy, unspecified: Secondary | ICD-10-CM | POA: Insufficient documentation

## 2019-10-04 DIAGNOSIS — Z6841 Body Mass Index (BMI) 40.0 and over, adult: Secondary | ICD-10-CM | POA: Insufficient documentation

## 2019-10-04 DIAGNOSIS — Z79899 Other long term (current) drug therapy: Secondary | ICD-10-CM | POA: Diagnosis not present

## 2019-10-04 DIAGNOSIS — G4733 Obstructive sleep apnea (adult) (pediatric): Secondary | ICD-10-CM | POA: Diagnosis not present

## 2019-10-04 LAB — BASIC METABOLIC PANEL
Anion gap: 9 (ref 5–15)
BUN: 44 mg/dL — ABNORMAL HIGH (ref 6–20)
CO2: 23 mmol/L (ref 22–32)
Calcium: 9.4 mg/dL (ref 8.9–10.3)
Chloride: 105 mmol/L (ref 98–111)
Creatinine, Ser: 2.43 mg/dL — ABNORMAL HIGH (ref 0.61–1.24)
GFR calc Af Amer: 34 mL/min — ABNORMAL LOW (ref >60)
GFR calc non Af Amer: 29 mL/min — ABNORMAL LOW (ref >60)
Glucose, Bld: 226 mg/dL — ABNORMAL HIGH (ref 70–99)
Potassium: 5.1 mmol/L (ref 3.5–5.1)
Sodium: 137 mmol/L (ref 135–145)

## 2019-10-04 MED ORDER — SACUBITRIL-VALSARTAN 24-26 MG PO TABS: 1.0000 | ORAL_TABLET | Freq: Two times a day (BID) | ORAL | 5 refills | Status: DC

## 2019-10-04 NOTE — Telephone Encounter (Signed)
Patient Advocate Encounter   Received notification from Medicaid that prior authorization for Dustin Mcclain is required.   PA submitted on [employer] [place] [id number] W Status is pending   Will continue to follow.

## 2019-10-04 NOTE — Patient Instructions (Addendum)
STOP Losartan   START Entresto 24/26mg  (1 tab) twice a day   Labs today and repeat in 10 days We will only contact you if something comes back abnormal or we need to make some changes. Otherwise no news is good news!   You have been referred to healthy weight and wellness. They will call you to schedule an appointment.   Your physician has requested that you have an echocardiogram. Echocardiography is a painless test that uses sound waves to create images of your heart. It provides your doctor with information about the size and shape of your heart and how well your heart's chambers and valves are working. This procedure takes approximately one hour. There are no restrictions for this procedure.   Your physician recommends that you schedule a follow-up appointment in: 3 weeks with the Pharmacist and 6 weeks with Dr Aundra Dubin   Please call office at [phone] option 2 if you have any questions or concerns.   At the [facility], you and your health needs are our priority. As part of our continuing mission to provide you with exceptional heart care, we have created designated Provider Care Teams. These Care Teams include your primary Cardiologist (physician) and Advanced Practice Providers (APPs- Physician Assistants and Nurse Practitioners) who all work together to provide you with the care you need, when you need it.   You may see any of the following providers on your designated Care Team at your next follow up: Marland Kitchen Dr Glori Bickers . Dr Loralie Champagne . Darrick Grinder, NP . Lyda Jester, PA . Audry Riles, PharmD   Please be sure to bring in all your medications bottles to every appointment.

## 2019-10-04 NOTE — Telephone Encounter (Signed)
Pt advised to stop kcl. Pt is switching to entresto from losartan.  Pt has 6/7 for lab. Pt voiced understanding.

## 2019-10-04 NOTE — Telephone Encounter (Signed)
-----   Message from Larey Dresser, MD sent at 10/04/2019  2:13 PM EDT ----- Creatinine mildly higher.  Think he needs to STOP KCl as K is mildly high.  He is stopping losartan and starting Entresto, will need to follow BMET closely => make sure he has a recheck next week.

## 2019-10-06 NOTE — Progress Notes (Signed)
PCP: Dr. Glendon Axe HF Cardiology: Dr. Aundra Dubin  54 y.o. yo with CKD stage 3, DM, HTN, and chronic systolic CHF was initially referred by Dr. Tamala Julian at [facility] for evaluation of CHF. Patient was diagnosed with a cardiomyopathy as far back as 2007.  His mother and father both have CHF and have ICDs.  Uncle had a heart transplant.  He has had long-standing HTN and diabetes.  We have record of a Cardiolite in 2017 with EF 25%, no ischemia and an echo in 2017 with EF 35-40%.  He says that with better blood pressure control, his EF returned to normal range on an echo at outside facility.  He was on Entresto in the past and felt like it helped his symptoms, but he has been off it for as his insurance is not covering it.   He was lost to followup in our clinic for about a year but was recently referred back by Dr. Harriet Masson.   Most recent echo in 11/20 showed EF 35-40%, mild LV dilation, moderate LVH, moderately dilated RV with normal systolic function.   He had been off his cardiac meds, but is now taking all his medications and BP is controlled.  He is using CPAP regularly.  He is able to walk about 2 miles before getting fatigued and short of breath.  He is not lightheaded.  He walks for exercise and rides an exercise bike.  He used to drink fairly heavily but is not drinking now.  K was high recently, but he is still taking KCl supplement (feels like he needs it for cramps).     Labs (3/17): K 4.2, creatinine 1.57 Labs (5/20): LDL 118 Labs (4/21): K 5.5, creatinine 2.2  ECG: NSR, LAFB, lateral TWIs  PMH: 1. OSA: Uses CPAP.  2. Type 2 diabetes 3. Morbid obesity 4. Hyperlipidemia 5. CKD stage 3: Sees Dr. Marval Regal.  6. Cardiomyopathy: Diagnosed in 2007. Presumed nonischemic, possibly related to long-standing HTN versus familial cardiomyopathy.  - Cardiolite in 2017: EF 25%, no ischemia.  - Echo (2017): EF 35-40%.  - Echo (11/20): EF 35-40%, mildly dilated LV, moderate LVH, moderate RV  dilation with normal systolic function.  7. HTN  Social History   Socioeconomic History  . Marital status: Divorced    Spouse name: Not on file  . Number of children: Not on file  . Years of education: Not on file  . Highest education level: Not on file  Occupational History  . Not on file  Tobacco Use  . Smoking status: Never Smoker  . Smokeless tobacco: Never Used  Substance and Sexual Activity  . Alcohol use: Yes    Alcohol/week: 2.0 standard drinks    Types: 1 Cans of beer, 1 Shots of liquor per week    Comment: socially  . Drug use: Yes    Types: Marijuana    Comment: once weekly  . Sexual activity: Not on file  Other Topics Concern  . Not on file  Social History Narrative  . Not on file   Social Determinants of Health   Financial Resource Strain:   . Difficulty of Paying Living Expenses:   Food Insecurity:   . Worried About Charity fundraiser in the Last Year:   . Arboriculturist in the Last Year:   Transportation Needs:   . Film/video editor (Medical):   Marland Kitchen Lack of Transportation (Non-Medical):   Physical Activity:   . Days of Exercise per Week:   .  Minutes of Exercise per Session:   Stress:   . Feeling of Stress :   Social Connections:   . Frequency of Communication with Friends and Family:   . Frequency of Social Gatherings with Friends and Family:   . Attends Religious Services:   . Active Member of Clubs or Organizations:   . Attends Archivist Meetings:   Marland Kitchen Marital Status:   Intimate Partner Violence:   . Fear of Current or Ex-Partner:   . Emotionally Abused:   Marland Kitchen Physically Abused:   . Sexually Abused:    FH: Uncle with heart transplant, mother with CHF/ICD, father with CHF/ICD  ROS: All systems reviewed and negative except as per HPI.   Current Outpatient Medications  Medication Sig Dispense Refill  . albuterol (PROVENTIL HFA;VENTOLIN HFA) 108 (90 BASE) MCG/ACT inhaler Inhale 2 puffs into the lungs every 4 (four) hours as  needed for wheezing or shortness of breath.    Marland Kitchen atorvastatin (LIPITOR) 10 MG tablet Take 10 mg by mouth daily.    . carvedilol (COREG) 25 MG tablet Take 25 mg by mouth 2 (two) times daily with a meal.     . cholecalciferol (VITAMIN D) 1000 UNITS tablet Take 3,000 Units by mouth every morning.     . colchicine 0.6 MG tablet Take 0.6 mg by mouth as needed.     . furosemide (LASIX) 40 MG tablet Take 1 tablet (40 mg total) by mouth every morning AND 0.5 tablets (20 mg total) every evening. 45 tablet 6  . hydrALAZINE (APRESOLINE) 100 MG tablet Take 1 tablet (100 mg total) by mouth 2 (two) times daily. 180 tablet 3  . insulin regular human CONCENTRATED (HUMULIN R) 500 UNIT/ML kwikpen Use as directed. Max daily dose is 300 units daily.    . isosorbide mononitrate (IMDUR) 60 MG 24 hr tablet Take 1 tablet (60 mg total) by mouth daily. 90 tablet 1  . Liraglutide (VICTOZA [place]) Inject into the skin. 1.2 mg at bedtime    . Magnesium 250 MG TABS Take 1 tablet by mouth every morning.     Marland Kitchen spironolactone (ALDACTONE) 25 MG tablet Take 25 mg by mouth 2 (two) times daily.    . Testosterone (ANDROGEL) 20.25 MG/1.25GM (1.62%) GEL Place 1 application onto the skin every morning.     . sacubitril-valsartan (ENTRESTO) 24-26 MG Take 1 tablet by mouth 2 (two) times daily. 60 tablet 5   No current facility-administered medications for this encounter.   BP 120/76   Pulse 84   Ht 5\' 11"  (1.803 m)   Wt (!) 170.3 kg (375 lb 6.4 oz)   SpO2 96%   BMI 52.36 kg/m  General: NAD, obese.  Neck: No JVD, no thyromegaly or thyroid nodule.  Lungs: Clear to auscultation bilaterally with normal respiratory effort. CV: Nondisplaced PMI.  Heart regular S1/S2, no S3/S4, no murmur.  No peripheral edema.  No carotid bruit.  Normal pedal pulses.  Abdomen: Soft, nontender, no hepatosplenomegaly, no distention.  Skin: Intact without lesions or rashes.  Neurologic: Alert and oriented x 3.  Psych: Normal affect. Extremities: No clubbing  or cyanosis.  HEENT: Normal.   Assessment/Plan: 1. Cardiomyopathy/chronic systolic CHF: Uncertain etiology but suspect related to uncontrolled hypertension vs diabetes vs a familial cardiomyopathy given extensive family history.  He had a Cardiolite in 2017 that did not show ischemia.  Last echo in 11/20 showed EF 35-40% with moderate LVH.  He recently restarted taking his cardiac medications regularly, and BP is well-controlled.  -  Continue Coreg 25 mg bid and spironolactone 25 mg bid.  - Continue hydralazine mg bid (cannot remember to take tid) and Imdur 60 mg daily.  - If BMET shows stable K and creatinine today, will have him stop losartan and start Entresto 24/26 bid.  I reviewed meds with our HF pharmacist, and he should be able to get Entresto. - BMET today and in 10 days.  - I will get an echo at followup with BP controlled.  If EF remains low, cardiomyopathy may be familial at least in part rather than all due to HTN.  2. HTN: BP controlled when he takes all his meds.   3. Type II diabetes: Consider use of Farxiga or Jardiance in the future.   4. Morbid obesity: He has had a lot of trouble losing weight.  - I will refer to [facility].  He is motivated to lose weight.   5. OSA: Continue CPAP.  6. CKD: Stage 3.  Check BMET today.   Followup with HF pharmacist in 3 wks and see me in 6 wks with echo.   Loralie Champagne 10/06/2019

## 2019-10-08 NOTE — Telephone Encounter (Signed)
Advanced Heart Failure Patient Advocate Encounter  Prior Authorization for Delene Loll has been approved.    PA# [id number] Effective dates: 10/04/19 through 09/28/20  Charlann Boxer, CPhT

## 2019-10-14 ENCOUNTER — Other Ambulatory Visit: Payer: Self-pay

## 2019-10-14 ENCOUNTER — Telehealth (HOSPITAL_COMMUNITY): Payer: Self-pay

## 2019-10-14 ENCOUNTER — Ambulatory Visit (HOSPITAL_COMMUNITY)
Admission: RE | Admit: 2019-10-14 | Discharge: 2019-10-14 | Disposition: A | Discharge status: Home / Self Care | Payer: Medicaid Other | Source: Ambulatory Visit | Attending: Internal Medicine | Admitting: Internal Medicine

## 2019-10-14 DIAGNOSIS — I5022 Chronic systolic (congestive) heart failure: Secondary | ICD-10-CM | POA: Diagnosis present

## 2019-10-14 LAB — BASIC METABOLIC PANEL
Anion gap: 10 (ref 5–15)
BUN: 37 mg/dL — ABNORMAL HIGH (ref 6–20)
CO2: 22 mmol/L (ref 22–32)
Calcium: 9.3 mg/dL (ref 8.9–10.3)
Chloride: 104 mmol/L (ref 98–111)
Creatinine, Ser: 2.41 mg/dL — ABNORMAL HIGH (ref 0.61–1.24)
GFR calc Af Amer: 34 mL/min — ABNORMAL LOW (ref >60)
GFR calc non Af Amer: 30 mL/min — ABNORMAL LOW (ref >60)
Glucose, Bld: 260 mg/dL — ABNORMAL HIGH (ref 70–99)
Potassium: 5.4 mmol/L — ABNORMAL HIGH (ref 3.5–5.1)
Sodium: 136 mmol/L (ref 135–145)

## 2019-10-14 MED ORDER — SPIRONOLACTONE 25 MG PO TABS: 12.5000 mg | ORAL_TABLET | Freq: Every day | ORAL | 3 refills | Status: DC

## 2019-10-14 NOTE — Telephone Encounter (Signed)
-----   Message from Larey Dresser, MD sent at 10/14/2019 12:35 PM EDT ----- Stable creatine but K is high.  Stop any K supplement.

## 2019-10-14 NOTE — Telephone Encounter (Signed)
Patient advised and verbalized understanding. Pt denies taking any K supplements but states he will check again to make sure he is not. Med list updated to reflect changes

## 2019-10-23 NOTE — Progress Notes (Signed)
PCP: Dr. Glendon Axe HF Cardiology: Dr. Aundra Dubin  HPI:  54 y.o. with CKD stage 3, DM, HTN, and chronic systolic CHF was initially referred by Dr. Tamala Julian at [facility] for evaluation of CHF. Patient was diagnosed with a cardiomyopathy as far back as 2007.  His mother and father both have CHF and have ICDs.  Uncle had a heart transplant.  He has had long-standing HTN and diabetes.  We have record of a Cardiolite in 2017 with EF 25%, no ischemia and an echo in 2017 with EF 35-40%.  He says that with better blood pressure control, his EF returned to normal range on an echo at outside facility.  He was on Entresto in the past and felt like it helped his symptoms, but he had been as his insurance was not covering it.   He was lost to followup in our clinic for a year but was recently referred back by Dr. Harriet Masson.   Most recent echo in 11/20 showed EF 35-40%, mild LV dilation, moderate LVH, moderately dilated RV with normal systolic function.   Recently presented to HF Clinic with Dr. Aundra Dubin on 10/04/19. He had been off his cardiac meds, but reported he was now taking all his medications and BP was controlled.  He was using CPAP regularly.  He reported being able to walk about 2 miles before getting fatigued and short of breath.  He was not lightheaded.  He stated he was able to walk for exercise and ride an exercise bike.  He used to drink fairly heavily but was not drinking anymore.  K was high recently, but he was still taking KCl supplement (felt like he needed it for cramps).    Today he returns to HF clinic for pharmacist medication titration. At last visit with MD, losartan was changed to [facility]. His potassium supplement was discontinued due to elevated potassium of 5.1. Potassium remained elevated at 5.4 on repeat lab draw the following week. His spironolactone was held for 1 day and then decreased to 12.5 mg daily. All KCL supplements were discontinued. However, he did not tolerate these  changes well so he self-adjusted his medications. He complains of severe fatigue with the Entresto so he stopped it and resumed losartan 50 mg daily 1.5 weeks ago. He had also been taking spironolactone 25 mg BID, but decreased that to 25 mg once daily after elevated K on labs (did not reduce to 12.5 mg as instructed). Since he stopped the [facility] he has been feeling well. No dizziness, lightheadedness, chest pain, palpitations or fatigue. Says he can walk 2-3 miles without SOB. Weight up 3 lbs in clinic today. Weighs himself daily at home. Says weight had been stable at 168-172 lbs at home but that it had increased recently due to eating more salty foods and drinking more beer over the last 2 weeks at cook outs. Also noted he had been exercising less but he plans to restart this week. He has been taking furosemide 40 mg every morning and has not needed any extra recently. Says he takes extra when his hands and feet start to swell. He sleeps on 4 pillows, but this is stable and is to help be more comfortable while wearing his CPAP. No PND/orthopnea.   HF Medications: -Carvedilol 25 mg BID -Losartan 50 mg daily (self-discontinued Entresto) -Spironolactone 12.5 mg daily ->reported taking 25 mg daily -Hydralazine 100 mg BID -Isosorbide mononitrate 60 mg daily Furosemide 40 mgAM/20 mg PM - reported taking -40 mg daily  Has the patient been experiencing any side effects to the medications prescribed?  Yes - noted severe fatigue with Entresto. His SBP were 120-130s while taking the Entresto. He stopped the [facility] because it was making him feel poorly. He was fatigued, couldn't get out of bed and could not do his normal routines. He self-restarted losartan 50 mg daily.   Does the patient have any problems obtaining medications due to transportation or finances?   No - Has [place] Medicaid prescription insurance.  Understanding of regimen: good Understanding of indications: good Potential of compliance: fair -  takes all his medications but frequently self-adjusts medications.  Patient understands to avoid NSAIDs. Patient understands to avoid decongestants.    Pertinent Lab Values: . Serum creatinine 2.19, BUN 33, Potassium 5.0, Sodium 135, BNP 192.6 pg/mL  Vital Signs: . Weight: 378.2 lbs (last clinic weight: 375.4 lbs) . Blood pressure: 138/74  . Heart rate: 80   Assessment: 1. Cardiomyopathy/chronic systolic CHF: Uncertain etiology but suspect related to uncontrolled hypertension vs diabetes vs a familial cardiomyopathy given extensive family history.  He had a Cardiolite in 2017 that did not show ischemia.  Last echo in 11/20 showed EF 35-40% with moderate LVH.  He recently restarted taking his cardiac medications regularly, and BP is better controlled.  - Vitals: BP 138/74, HR 74 - Labs: Scr improved from 2.41 to 2.19. K improved from 5.4 to 5.0. BNP 192.6 pg/mL. - Continue furosemide 40 mg daily. - Continue carvedilol 25 mg BID - Continue losartan 50 mg daily. Patient self-discontinued Entresto 1.5 weeks ago due to debilitating fatigue.  - Continue spironolactone 25 mg daily. Patient was instructed to decrease spironolactone to 12.5 mg daily after last visit, but since he was taking 25 mg BID he decided to only decrease to 25 mg daily. Will continue spironolactone 25 mg daily as labs were improved today.  - Continue hydralazine 100 mg BID (cannot remember to take TID) and increase Imdur to 90 mg daily.  - Plan for echo at followup with BP controlled.  If EF remains low, cardiomyopathy may be familial at least in part rather than all due to HTN.  2. HTN: BP controlled when he takes all his medications.   3. Type II diabetes: Consider use of Farxiga or Jardiance in the future as Scr stabilizes.    4. Morbid obesity: He has had a lot of trouble losing weight.  - Referred to [facility].  He is motivated to lose weight.   5. OSA: Continue CPAP.  6. CKD: Stage 3.  Scr  improved to 2.19 on BMET today.    Plan: 1) Medication changes: Based on clinical presentation, vital signs and recent labs will increase Imdur to 90 mg daily. Updated patient medication list to reflect how the patient is actually taking furosemide, losartan and spironolactone. Entresto taken off medication list as patient reported not taking due to adverse effects.  2) Labs: Scr 2.19, K 5.0 3) Follow-up: 2 weeks with Dr. Rush Farmer, PharmD, BCPS, [facility], [place] Pharmacist [phone]

## 2019-11-04 ENCOUNTER — Ambulatory Visit (HOSPITAL_COMMUNITY)
Admission: RE | Admit: 2019-11-04 | Discharge: 2019-11-04 | Disposition: A | Discharge status: Home / Self Care | Payer: Medicaid Other | Source: Ambulatory Visit | Attending: Internal Medicine | Admitting: Internal Medicine

## 2019-11-04 ENCOUNTER — Other Ambulatory Visit: Payer: Self-pay

## 2019-11-04 VITALS — BP 138/74 | HR 80 | Wt 378.2 lb

## 2019-11-04 DIAGNOSIS — I429 Cardiomyopathy, unspecified: Secondary | ICD-10-CM | POA: Diagnosis not present

## 2019-11-04 DIAGNOSIS — R5383 Other fatigue: Secondary | ICD-10-CM | POA: Insufficient documentation

## 2019-11-04 DIAGNOSIS — I130 Hypertensive heart and chronic kidney disease with heart failure and stage 1 through stage 4 chronic kidney disease, or unspecified chronic kidney disease: Secondary | ICD-10-CM | POA: Diagnosis not present

## 2019-11-04 DIAGNOSIS — N183 Chronic kidney disease, stage 3 unspecified: Secondary | ICD-10-CM | POA: Diagnosis not present

## 2019-11-04 DIAGNOSIS — Z79899 Other long term (current) drug therapy: Secondary | ICD-10-CM | POA: Diagnosis not present

## 2019-11-04 DIAGNOSIS — E1122 Type 2 diabetes mellitus with diabetic chronic kidney disease: Secondary | ICD-10-CM | POA: Insufficient documentation

## 2019-11-04 DIAGNOSIS — G4733 Obstructive sleep apnea (adult) (pediatric): Secondary | ICD-10-CM | POA: Insufficient documentation

## 2019-11-04 DIAGNOSIS — I5022 Chronic systolic (congestive) heart failure: Secondary | ICD-10-CM | POA: Insufficient documentation

## 2019-11-04 DIAGNOSIS — Z7901 Long term (current) use of anticoagulants: Secondary | ICD-10-CM | POA: Diagnosis not present

## 2019-11-04 LAB — BASIC METABOLIC PANEL
Anion gap: 8 (ref 5–15)
BUN: 33 mg/dL — ABNORMAL HIGH (ref 6–20)
CO2: 23 mmol/L (ref 22–32)
Calcium: 9.3 mg/dL (ref 8.9–10.3)
Chloride: 104 mmol/L (ref 98–111)
Creatinine, Ser: 2.19 mg/dL — ABNORMAL HIGH (ref 0.61–1.24)
GFR calc Af Amer: 39 mL/min — ABNORMAL LOW (ref >60)
GFR calc non Af Amer: 33 mL/min — ABNORMAL LOW (ref >60)
Glucose, Bld: 188 mg/dL — ABNORMAL HIGH (ref 70–99)
Potassium: 5 mmol/L (ref 3.5–5.1)
Sodium: 135 mmol/L (ref 135–145)

## 2019-11-04 LAB — BRAIN NATRIURETIC PEPTIDE: B Natriuretic Peptide: 192.6 pg/mL — ABNORMAL HIGH (ref 0.0–100.0)

## 2019-11-04 MED ORDER — ISOSORBIDE MONONITRATE ER 60 MG PO TB24: ORAL_TABLET | ORAL | 11 refills | Status: DC

## 2019-11-04 MED ORDER — LOSARTAN POTASSIUM 50 MG PO TABS: 50.0000 mg | ORAL_TABLET | Freq: Every day | ORAL | 11 refills | Status: DC

## 2019-11-04 MED ORDER — SPIRONOLACTONE 25 MG PO TABS: 25.0000 mg | ORAL_TABLET | Freq: Every day | ORAL | 11 refills | Status: DC

## 2019-11-04 MED ORDER — FUROSEMIDE 40 MG PO TABS: 40.0000 mg | ORAL_TABLET | Freq: Every day | ORAL | 11 refills | Status: DC

## 2019-11-04 NOTE — Patient Instructions (Addendum)
It was a pleasure seeing you today!  MEDICATIONS: -We are changing your medications today -Increase isosorbide mononitrate (Imdur) to 90 mg (1.5 tablets) daily -Call if you have questions about your medications.  LABS: -We will call you if your labs need attention.  NEXT APPOINTMENT:  Return to clinic in 2 weeks with Dr. Aundra Dubin.  In general, to take care of your heart failure: -Limit your fluid intake to 2 Liters (half-gallon) per day.   -Limit your salt intake to ideally 2-3 grams (2000-3000 mg) per day. -Weigh yourself daily and record, and bring that "weight diary" to your next appointment.  (Weight gain of 2-3 pounds in 1 day typically means fluid weight.) -The medications for your heart are to help your heart and help you live longer.   -Please contact us before stopping any of your heart medications.  Call the clinic at [phone] with questions or to reschedule future appointments.

## 2019-11-05 ENCOUNTER — Other Ambulatory Visit (HOSPITAL_COMMUNITY): Payer: Self-pay

## 2019-11-05 MED ORDER — ISOSORBIDE MONONITRATE ER 30 MG PO TB24: 90.0000 mg | ORAL_TABLET | Freq: Every day | ORAL | 3 refills | Status: DC

## 2019-11-05 NOTE — Telephone Encounter (Signed)
Pharmacy sent over a fax stating that the isosorbide 60mg  can't be spilt so they requested that we sent in the 30mg  tablets. New rx sent.   Meds ordered this encounter  Medications   isosorbide mononitrate (IMDUR) 30 MG 24 hr tablet    Sig: Take 3 tablets (90 mg total) by mouth daily.    Dispense:  270 tablet    Refill:  3    Please cancel all previous orders for current medication. Change in dosage or pill size.

## 2019-11-19 ENCOUNTER — Ambulatory Visit (HOSPITAL_BASED_OUTPATIENT_CLINIC_OR_DEPARTMENT_OTHER)
Admission: RE | Admit: 2019-11-19 | Discharge: 2019-11-19 | Disposition: A | Discharge status: Home / Self Care | Payer: Medicaid Other | Source: Ambulatory Visit | Attending: Cardiology | Admitting: Cardiology

## 2019-11-19 ENCOUNTER — Encounter (HOSPITAL_COMMUNITY): Payer: Self-pay | Admitting: Cardiology

## 2019-11-19 ENCOUNTER — Other Ambulatory Visit: Payer: Self-pay

## 2019-11-19 ENCOUNTER — Ambulatory Visit (HOSPITAL_COMMUNITY)
Admission: RE | Admit: 2019-11-19 | Discharge: 2019-11-19 | Disposition: A | Discharge status: Home / Self Care | Payer: Medicaid Other | Source: Ambulatory Visit | Attending: Cardiology | Admitting: Cardiology

## 2019-11-19 ENCOUNTER — Other Ambulatory Visit (HOSPITAL_COMMUNITY): Payer: Self-pay | Admitting: Surgery

## 2019-11-19 VITALS — BP 152/98 | HR 86 | Wt 380.0 lb

## 2019-11-19 DIAGNOSIS — Z8249 Family history of ischemic heart disease and other diseases of the circulatory system: Secondary | ICD-10-CM | POA: Insufficient documentation

## 2019-11-19 DIAGNOSIS — G4733 Obstructive sleep apnea (adult) (pediatric): Secondary | ICD-10-CM | POA: Insufficient documentation

## 2019-11-19 DIAGNOSIS — Z7901 Long term (current) use of anticoagulants: Secondary | ICD-10-CM | POA: Diagnosis not present

## 2019-11-19 DIAGNOSIS — E785 Hyperlipidemia, unspecified: Secondary | ICD-10-CM | POA: Diagnosis not present

## 2019-11-19 DIAGNOSIS — Z6841 Body Mass Index (BMI) 40.0 and over, adult: Secondary | ICD-10-CM | POA: Diagnosis not present

## 2019-11-19 DIAGNOSIS — I5022 Chronic systolic (congestive) heart failure: Secondary | ICD-10-CM

## 2019-11-19 DIAGNOSIS — I10 Essential (primary) hypertension: Secondary | ICD-10-CM

## 2019-11-19 DIAGNOSIS — E1122 Type 2 diabetes mellitus with diabetic chronic kidney disease: Secondary | ICD-10-CM | POA: Diagnosis not present

## 2019-11-19 DIAGNOSIS — N183 Chronic kidney disease, stage 3 unspecified: Secondary | ICD-10-CM | POA: Diagnosis not present

## 2019-11-19 DIAGNOSIS — Z79899 Other long term (current) drug therapy: Secondary | ICD-10-CM | POA: Insufficient documentation

## 2019-11-19 DIAGNOSIS — E875 Hyperkalemia: Secondary | ICD-10-CM

## 2019-11-19 DIAGNOSIS — Z794 Long term (current) use of insulin: Secondary | ICD-10-CM | POA: Diagnosis not present

## 2019-11-19 DIAGNOSIS — I130 Hypertensive heart and chronic kidney disease with heart failure and stage 1 through stage 4 chronic kidney disease, or unspecified chronic kidney disease: Secondary | ICD-10-CM | POA: Insufficient documentation

## 2019-11-19 LAB — BASIC METABOLIC PANEL
Anion gap: 9 (ref 5–15)
BUN: 39 mg/dL — ABNORMAL HIGH (ref 6–20)
CO2: 21 mmol/L — ABNORMAL LOW (ref 22–32)
Calcium: 9.5 mg/dL (ref 8.9–10.3)
Chloride: 105 mmol/L (ref 98–111)
Creatinine, Ser: 2.17 mg/dL — ABNORMAL HIGH (ref 0.61–1.24)
GFR calc Af Amer: 39 mL/min — ABNORMAL LOW (ref >60)
GFR calc non Af Amer: 34 mL/min — ABNORMAL LOW (ref >60)
Glucose, Bld: 255 mg/dL — ABNORMAL HIGH (ref 70–99)
Potassium: 5.3 mmol/L — ABNORMAL HIGH (ref 3.5–5.1)
Sodium: 135 mmol/L (ref 135–145)

## 2019-11-19 MED ORDER — DAPAGLIFLOZIN PROPANEDIOL 10 MG PO TABS
10.0000 mg | ORAL_TABLET | Freq: Every day | ORAL | 11 refills | Status: AC
Start: 2019-11-19 — End: ?

## 2019-11-19 MED ORDER — PATIROMER SORBITEX CALCIUM 8.4 G PO PACK: 8.4000 g | PACK | Freq: Every day | ORAL | Status: DC

## 2019-11-19 MED ORDER — VELTASSA 8.4 G PO PACK: 8.4000 g | PACK | Freq: Every day | ORAL | 3 refills | Status: DC

## 2019-11-19 NOTE — Progress Notes (Signed)
[place] prescription sent to pharmacy per order from Dr. Aundra Dubin.

## 2019-11-19 NOTE — Patient Instructions (Signed)
START Farxiga 10 mg, one tab daily  Labs today We will only contact you if something comes back abnormal or we need to make some changes. Otherwise no news is good news!  Labs needed in 10 days  Your physician recommends that you schedule a follow-up appointment in: 3 months  in the Advanced Practitioners (PA/NP) Clinic    Do the following things EVERYDAY: 1) Weigh yourself in the morning before breakfast. Write it down and keep it in a log. 2) Take your medicines as prescribed 3) Eat low salt foods--Limit salt (sodium) to 2000 mg per day.  4) Stay as active as you can everyday 5) Limit all fluids for the day to less than 2 liters

## 2019-11-19 NOTE — Progress Notes (Signed)
°  Echocardiogram 2D Echocardiogram has been performed.  Dustin Mcclain 11/19/2019, 11:40 AM

## 2019-11-20 NOTE — Progress Notes (Signed)
PCP: Dr. Glendon Axe HF Cardiology: Dr. Aundra Dubin  54 y.o. yo with CKD stage 3, DM, HTN, and chronic systolic CHF was initially referred by Dr. Tamala Julian at [facility] for evaluation of CHF. Patient was diagnosed with a cardiomyopathy as far back as 2007.  His mother and father both have CHF and have ICDs.  Uncle had a heart transplant.  He has had long-standing HTN and diabetes.  We have record of a Cardiolite in 2017 with EF 25%, no ischemia and an echo in 2017 with EF 35-40%.  He says that with better blood pressure control, his EF returned to normal range on an echo at outside facility.  He was on Entresto in the past and felt like it helped his symptoms, but he has been off it for as his insurance is not covering it.   He was lost to followup in our clinic for about a year but was referred back by Dr. Harriet Masson.   Echo in 11/20 showed EF 35-40%, mild LV dilation, moderate LVH, moderately dilated RV with normal systolic function.   Echo was done today and reviewed, showing EF 40-45%, mild LVH, RV normal.   He returns for followup of CHF.  No dyspnea walking on flat ground, mild dyspnea walking up stairs.  BP high today, but he says that SBP typically runs 120s-130s when he checks at home. He tried to take [facility] but developed severe fatigue with it and had to stop it. Weight is up 5 lbs. He is using CPAP.  No chest pain, orthopnea, or PND.      Labs (3/17): K 4.2, creatinine 1.57 Labs (5/20): LDL 118 Labs (4/21): K 5.5, creatinine 2.2 Labs (6/21): K 5, creatinine 2.19  PMH: 1. OSA: Uses CPAP.  2. Type 2 diabetes 3. Morbid obesity 4. Hyperlipidemia 5. CKD stage 3: Sees Dr. Marval Regal.  6. Cardiomyopathy: Diagnosed in 2007. Presumed nonischemic, possibly related to long-standing HTN versus familial cardiomyopathy.  - Cardiolite in 2017: EF 25%, no ischemia.  - Echo (2017): EF 35-40%.  - Echo (11/20): EF 35-40%, mildly dilated LV, moderate LVH, moderate RV dilation with normal systolic  function.  - Echo (7/21): EF 40-45%, mild LVH, RV normal.  7. HTN  Social History   Socioeconomic History  . Marital status: Divorced    Spouse name: Not on file  . Number of children: Not on file  . Years of education: Not on file  . Highest education level: Not on file  Occupational History  . Not on file  Tobacco Use  . Smoking status: Never Smoker  . Smokeless tobacco: Never Used  Vaping Use  . Vaping Use: Never used  Substance and Sexual Activity  . Alcohol use: Yes    Alcohol/week: 2.0 standard drinks    Types: 1 Cans of beer, 1 Shots of liquor per week    Comment: socially  . Drug use: Yes    Types: Marijuana    Comment: once weekly  . Sexual activity: Not on file  Other Topics Concern  . Not on file  Social History Narrative  . Not on file   Social Determinants of Health   Financial Resource Strain:   . Difficulty of Paying Living Expenses:   Food Insecurity:   . Worried About Charity fundraiser in the Last Year:   . Arboriculturist in the Last Year:   Transportation Needs:   . Film/video editor (Medical):   Marland Kitchen Lack of Transportation (Non-Medical):  Physical Activity:   . Days of Exercise per Week:   . Minutes of Exercise per Session:   Stress:   . Feeling of Stress :   Social Connections:   . Frequency of Communication with Friends and Family:   . Frequency of Social Gatherings with Friends and Family:   . Attends Religious Services:   . Active Member of Clubs or Organizations:   . Attends Archivist Meetings:   Marland Kitchen Marital Status:   Intimate Partner Violence:   . Fear of Current or Ex-Partner:   . Emotionally Abused:   Marland Kitchen Physically Abused:   . Sexually Abused:    FH: Uncle with heart transplant, mother with CHF/ICD, father with CHF/ICD  ROS: All systems reviewed and negative except as per HPI.   Current Outpatient Medications  Medication Sig Dispense Refill  . albuterol (PROVENTIL HFA;VENTOLIN HFA) 108 (90 BASE) MCG/ACT  inhaler Inhale 2 puffs into the lungs every 4 (four) hours as needed for wheezing or shortness of breath.    Marland Kitchen atorvastatin (LIPITOR) 10 MG tablet Take 10 mg by mouth daily.    . carvedilol (COREG) 25 MG tablet Take 25 mg by mouth 2 (two) times daily with a meal.     . cholecalciferol (VITAMIN D) 1000 UNITS tablet Take 3,000 Units by mouth every morning.     . furosemide (LASIX) 40 MG tablet Take 1 tablet (40 mg total) by mouth daily. 30 tablet 11  . hydrALAZINE (APRESOLINE) 100 MG tablet Take 1 tablet (100 mg total) by mouth 2 (two) times daily. 180 tablet 3  . insulin regular human CONCENTRATED (HUMULIN R) 500 UNIT/ML kwikpen Use as directed. Max daily dose is 300 units daily.    . isosorbide mononitrate (IMDUR) 30 MG 24 hr tablet Take 3 tablets (90 mg total) by mouth daily. 270 tablet 3  . Liraglutide (VICTOZA [place]) Inject into the skin. 1.2 mg at bedtime    . losartan (COZAAR) 50 MG tablet Take 1 tablet (50 mg total) by mouth daily. 30 tablet 11  . Magnesium 250 MG TABS Take 1 tablet by mouth every morning.     Marland Kitchen spironolactone (ALDACTONE) 25 MG tablet Take 1 tablet (25 mg total) by mouth daily. 30 tablet 11  . Testosterone (ANDROGEL) 20.25 MG/1.25GM (1.62%) GEL Place 1 application onto the skin every morning.     . colchicine 0.6 MG tablet Take 0.6 mg by mouth as needed.  (Patient not taking: Reported on 11/19/2019)    . dapagliflozin propanediol (FARXIGA) 10 MG TABS tablet Take 1 tablet (10 mg total) by mouth daily before breakfast. 30 tablet 11  . patiromer (VELTASSA) 8.4 g packet Take 1 packet (8.4 g total) by mouth daily. 30 each 3   No current facility-administered medications for this encounter.   BP (!) 152/98   Pulse 86   Wt (!) 172.4 kg (380 lb)   SpO2 99%   BMI 53.00 kg/m  General: NAD Neck: No JVD, no thyromegaly or thyroid nodule.  Lungs: Clear to auscultation bilaterally with normal respiratory effort. CV: Nondisplaced PMI.  Heart regular S1/S2, no S3/S4, no murmur.  1+  ankle edema.  No carotid bruit.  Normal pedal pulses.  Abdomen: Soft, nontender, no hepatosplenomegaly, no distention.  Skin: Intact without lesions or rashes.  Neurologic: Alert and oriented x 3.  Psych: Normal affect. Extremities: No clubbing or cyanosis.  HEENT: Normal.   Assessment/Plan: 1. Cardiomyopathy/chronic systolic CHF: Uncertain etiology but suspect related to uncontrolled hypertension vs diabetes  vs a familial cardiomyopathy given extensive family history.  He had a Cardiolite in 2017 that did not show ischemia.  Echo was done today and reviewed, EF up to 40-45%.  NYHA class II symptoms, not volume overloaded on exam.  - Continue Coreg 25 mg bid and spironolactone 25 mg daily.  - Continue hydralazine 100 mg bid (cannot remember to take tid) and Imdur 60 mg daily.  - Continue losartan 50 mg daily, he is unable to tolerate Entresto due to fatigue.  - Start dapagliflozin 10 mg daily, BMET today and in 10 days.  - EF is out of ICD range.  2. HTN: BP controlled on home readings.  3. Type II diabetes: Starting Farxiga.   4. Morbid obesity: He has had a lot of trouble losing weight.  - He has been referred to [facility].    5. OSA: Continue CPAP.  6. CKD: Stage 3.  Check BMET today.   Followup 3 months with NP/PA.   Loralie Champagne 11/20/2019

## 2019-11-28 ENCOUNTER — Ambulatory Visit: Payer: Medicaid Other | Admitting: Cardiology

## 2019-11-29 ENCOUNTER — Other Ambulatory Visit: Payer: Self-pay

## 2019-11-29 ENCOUNTER — Ambulatory Visit (HOSPITAL_COMMUNITY)
Admission: RE | Admit: 2019-11-29 | Discharge: 2019-11-29 | Disposition: A | Discharge status: Home / Self Care | Payer: Medicaid Other | Source: Ambulatory Visit | Attending: Cardiology | Admitting: Cardiology

## 2019-11-29 DIAGNOSIS — I5022 Chronic systolic (congestive) heart failure: Secondary | ICD-10-CM | POA: Diagnosis present

## 2019-11-29 LAB — BASIC METABOLIC PANEL
Anion gap: 11 (ref 5–15)
BUN: 39 mg/dL — ABNORMAL HIGH (ref 6–20)
CO2: 22 mmol/L (ref 22–32)
Calcium: 9.4 mg/dL (ref 8.9–10.3)
Chloride: 103 mmol/L (ref 98–111)
Creatinine, Ser: 2.56 mg/dL — ABNORMAL HIGH (ref 0.61–1.24)
GFR calc Af Amer: 32 mL/min — ABNORMAL LOW (ref >60)
GFR calc non Af Amer: 28 mL/min — ABNORMAL LOW (ref >60)
Glucose, Bld: 274 mg/dL — ABNORMAL HIGH (ref 70–99)
Potassium: 4.7 mmol/L (ref 3.5–5.1)
Sodium: 136 mmol/L (ref 135–145)

## 2019-12-18 ENCOUNTER — Encounter: Payer: Self-pay | Admitting: Cardiology

## 2019-12-18 ENCOUNTER — Other Ambulatory Visit: Payer: Self-pay

## 2019-12-18 ENCOUNTER — Ambulatory Visit (INDEPENDENT_AMBULATORY_CARE_PROVIDER_SITE_OTHER): Payer: Medicaid Other | Admitting: Cardiology

## 2019-12-18 VITALS — BP 160/100 | HR 74 | Ht 71.0 in | Wt 373.0 lb

## 2019-12-18 DIAGNOSIS — I420 Dilated cardiomyopathy: Secondary | ICD-10-CM | POA: Diagnosis not present

## 2019-12-18 DIAGNOSIS — I5022 Chronic systolic (congestive) heart failure: Secondary | ICD-10-CM | POA: Diagnosis not present

## 2019-12-18 DIAGNOSIS — I519 Heart disease, unspecified: Secondary | ICD-10-CM

## 2019-12-18 DIAGNOSIS — E782 Mixed hyperlipidemia: Secondary | ICD-10-CM

## 2019-12-18 DIAGNOSIS — I10 Essential (primary) hypertension: Secondary | ICD-10-CM

## 2019-12-18 NOTE — Progress Notes (Signed)
Cardiology Office Note:    Date:  12/18/2019   ID:  Dustin Mcclain, DOB 04-05-1966, MRN [id number]  PCP:  Glendon Axe, MD  Cardiologist:  Berniece Salines, DO  Electrophysiologist:  None   Referring MD: Glendon Axe, MD   Chief Complaint  Patient presents with  . Follow-up    History of Present Illness:    Dustin Mcclain is a 54 y.o. male with a hx of history of hypertension, CKD stage III, hyperlipidemia, systolic heart failure, dilated cardiomyopathy EF recently improved EF 40 to 45% from 35 to 40%, morbid obesity.  I last saw the patient on Dustin 1, 2021 at that time our discussion was heavily based on medication management and compliance.  I also have the patient follow-up with the heart failure clinic as he had not been able to be improved approved for [place].  In the interim he was able to see the heart failure clinic.  He was tried on [place] which gave him significant fatigue this medication was titrated off and back to losartan.  He is on [place] at this time.  Today based on chart review he had an echocardiogram November 19, 2019 which showed improvement of his ejection fraction to 40 to 45%.  Today he offers no complaints at this time he tells me that he has been doing well from a cardiovascular standpoint.  Past Medical History:  Diagnosis Date  . Allergic rhinitis, cause unspecified   . Anemia in stage 3 chronic kidney disease    sees dr Dustin Mcclain every 3 months  . Anxiety state, unspecified   . Asthma    mild  . Benign neoplasm of descending colon 04/15/2014  . Cardiomyopathy ([place]) 07/11/2008   Qualifier: Diagnosis of  By: Dustin Boots MD, Dustin Mcclain   . CHF 07/27/2008   Qualifier: Diagnosis of  By: Dustin Boots MD, Dustin Mcclain   . CHF (congestive heart failure) ([place])    right side  . Chronic systolic congestive heart failure ([place]) 10/22/2015  . Depressive disorder, not elsewhere classified   . Diabetes mellitus without complication ([place])   . DM 10/23/2009   Qualifier:  Diagnosis of  By: Dustin Boots MD, Dustin Mcclain   . GERD (gastroesophageal reflux disease)   . Insomnia 06/06/2008   Qualifier: Diagnosis of  By: Dustin Mcclain CMA, [name]    . Insomnia, unspecified   . Joint effusion 08/06/2013  . Mixed hyperlipidemia 10/03/2017  . Morbid obesity ([place])   . Obstructive sleep apnea 06/06/2008   NPSG 08/15/16   AHI 10.8/ hr, desaturation to 83%, body weight 365 lbs- requalified for CPAP   . Other primary cardiomyopathies   . Right knee pain 08/06/2013  . Seasonal allergic rhinitis 07/27/2008   Symptom patterns suggests primary sensitivity to spring grass pollens. Environmental precautions discussed. Potential candidate for sublingual therapy.    . Shortness of breath 10/19/2015  . Tendinitis    left wrist and elbow and left knee  . Unspecified disorder resulting from impaired renal function   . Unspecified essential hypertension   . Unspecified sleep apnea    cpap setting of 22 or 23    Past Surgical History:  Procedure Laterality Date  . APPENDECTOMY    . COLONOSCOPY WITH PROPOFOL N/A 04/15/2014   Procedure: COLONOSCOPY WITH PROPOFOL;  Surgeon: Dustin Shipper, MD;  Location: [facility];  Service: Endoscopy;  Laterality: N/A;  . PATELLAR TENDON REPAIR     left    Current Medications: Current Meds  Medication Sig  .  albuterol (PROVENTIL HFA;VENTOLIN HFA) 108 (90 BASE) MCG/ACT inhaler Inhale 2 puffs into the lungs every 4 (four) hours as needed for wheezing or shortness of breath.  Dustin Mcclain atorvastatin (LIPITOR) 10 MG tablet Take 10 mg by mouth daily.  . carvedilol (COREG) 25 MG tablet Take 25 mg by mouth 2 (two) times daily with a meal.   . cholecalciferol (VITAMIN Mcclain) 1000 UNITS tablet Take 3,000 Units by mouth every morning.   . colchicine 0.6 MG tablet Take 0.6 mg by mouth as needed.   . dapagliflozin propanediol (FARXIGA) 10 MG TABS tablet Take 1 tablet (10 mg total) by mouth daily before breakfast.  . furosemide (LASIX) 40 MG tablet Take 1 tablet (40 mg total) by mouth  daily.  . hydrALAZINE (APRESOLINE) 100 MG tablet Take 1 tablet (100 mg total) by mouth 2 (two) times daily.  . insulin regular human CONCENTRATED (HUMULIN R) 500 UNIT/ML kwikpen Use as directed. Max daily dose is 300 units daily.  . isosorbide mononitrate (IMDUR) 30 MG 24 hr tablet Take 3 tablets (90 mg total) by mouth daily.  . Liraglutide (VICTOZA [place]) Inject into the skin. 1.2 mg at bedtime  . losartan (COZAAR) 50 MG tablet Take 1 tablet (50 mg total) by mouth daily.  . Magnesium 250 MG TABS Take 1 tablet by mouth every morning.   Dustin Mcclain spironolactone (ALDACTONE) 25 MG tablet Take 1 tablet (25 mg total) by mouth daily.  . Testosterone (ANDROGEL) 20.25 MG/1.25GM (1.62%) GEL Place 1 application onto the skin every morning.      Allergies:   Patient has no known allergies.   Social History   Socioeconomic History  . Marital status: Divorced    Spouse name: Not on file  . Number of children: Not on file  . Years of education: Not on file  . Highest education level: Not on file  Occupational History  . Not on file  Tobacco Use  . Smoking status: Never Smoker  . Smokeless tobacco: Never Used  Vaping Use  . Vaping Use: Never used  Substance and Sexual Activity  . Alcohol use: Yes    Alcohol/week: 2.0 standard drinks    Types: 1 Cans of beer, 1 Shots of liquor per week    Comment: socially  . Drug use: Yes    Types: Marijuana    Comment: once weekly  . Sexual activity: Not on file  Other Topics Concern  . Not on file  Social History Narrative  . Not on file   Social Determinants of Health   Financial Resource Strain:   . Difficulty of Paying Living Expenses:   Food Insecurity:   . Worried About Charity fundraiser in the Last Year:   . Arboriculturist in the Last Year:   Transportation Needs:   . Film/video editor (Medical):   Dustin Mcclain Lack of Transportation (Non-Medical):   Physical Activity:   . Days of Exercise per Week:   . Minutes of Exercise per Session:   Stress:     . Feeling of Stress :   Social Connections:   . Frequency of Communication with Friends and Family:   . Frequency of Social Gatherings with Friends and Family:   . Attends Religious Services:   . Active Member of Clubs or Organizations:   . Attends Archivist Meetings:   Dustin Mcclain Marital Status:      Family History: The patient's family history includes Diabetes in his father and mother; Heart disease in his  father and mother.  ROS:   Review of Systems  Constitution: Negative for decreased appetite, fever and weight gain.  HENT: Negative for congestion, ear discharge, hoarse voice and sore throat.   Eyes: Negative for discharge, redness, vision loss in right eye and visual halos.  Cardiovascular: Negative for chest pain, dyspnea on exertion, leg swelling, orthopnea and palpitations.  Respiratory: Negative for cough, hemoptysis, shortness of breath and snoring.   Endocrine: Negative for heat intolerance and polyphagia.  Hematologic/Lymphatic: Negative for bleeding problem. Does not bruise/bleed easily.  Skin: Negative for flushing, nail changes, rash and suspicious lesions.  Musculoskeletal: Negative for arthritis, joint pain, muscle cramps, myalgias, neck pain and stiffness.  Gastrointestinal: Negative for abdominal pain, bowel incontinence, diarrhea and excessive appetite.  Genitourinary: Negative for decreased libido, genital sores and incomplete emptying.  Neurological: Negative for brief paralysis, focal weakness, headaches and loss of balance.  Psychiatric/Behavioral: Negative for altered mental status, depression and suicidal ideas.  Allergic/Immunologic: Negative for HIV exposure and persistent infections.    EKGs/Labs/Other Studies Reviewed:    The following studies were reviewed today:   EKG: None today  Echo IMPRESSIONS November 19, 2019 1. Left ventricular ejection fraction, by estimation, is 40 to 45%. The  left ventricle has mild to moderately decreased  function. The left  ventricle demonstrates global hypokinesis. The left ventricular internal  cavity size was mildly dilated. There is  moderate left ventricular hypertrophy. Left ventricular diastolic  parameters are consistent with Grade I diastolic dysfunction (impaired  relaxation).  2. Right ventricular systolic function is normal. The right ventricular  size is normal. Tricuspid regurgitation signal is inadequate for assessing  PA pressure.  3. The mitral valve is normal in structure. Trivial mitral valve  regurgitation. No evidence of mitral stenosis.  4. The aortic valve is tricuspid. Aortic valve regurgitation is not  visualized. No aortic stenosis is present.  5. The inferior vena cava is normal in size with greater than 50%  respiratory variability, suggesting right atrial pressure of 3 mmHg  Recent Labs: 08/08/2019: Magnesium 1.7 11/04/2019: B Natriuretic Peptide 192.6 11/29/2019: BUN 39; Creatinine, Ser 2.56; Potassium 4.7; Sodium 136  Recent Lipid Panel    Component Value Date/Time   CHOL 191 09/27/2018 1010   TRIG 193 (H) 09/27/2018 1010   HDL 34 (L) 09/27/2018 1010   CHOLHDL 5.6 09/27/2018 1010   VLDL 39 09/27/2018 1010   LDLCALC 118 (H) 09/27/2018 1010    Physical Exam:    VS:  BP (!) 160/100 (BP Location: Right Arm, Patient Position: Sitting, Cuff Size: Large)   Pulse 74   Ht 5\' 11"  (1.803 m)   Wt (!) 373 lb (169.2 kg)   SpO2 98%   BMI 52.02 kg/m     Wt Readings from Last 3 Encounters:  12/18/19 (!) 373 lb (169.2 kg)  11/19/19 (!) 380 lb (172.4 kg)  11/04/19 (!) 378 lb 3.2 oz (171.6 kg)     GEN: Well nourished, well developed in no acute distress HEENT: Normal NECK: No JVD; No carotid bruits LYMPHATICS: No lymphadenopathy CARDIAC: S1S2 noted,RRR, no murmurs, rubs, gallops RESPIRATORY:  Clear to auscultation without rales, wheezing or rhonchi  ABDOMEN: Soft, non-tender, non-distended, +bowel sounds, no guarding. EXTREMITIES: No edema, No  cyanosis, no clubbing MUSCULOSKELETAL:  No deformity  SKIN: Warm and dry NEUROLOGIC:  Alert and oriented x 3, non-focal PSYCHIATRIC:  Normal affect, good insight  ASSESSMENT:    1. Essential hypertension   2. Dilated cardiomyopathy ([place])   3. Chronic systolic  congestive heart failure ([place])   4. OBESITY, MORBID   5. Mixed hyperlipidemia   6. Depressed left ventricular systolic function    PLAN:      His blood pressure is elevated in the office today.  The patient tells me at home his blood pressure is running 974B and 638G systolic and his diastolic never gets over 90.  Ideally I would like to increase his losartan to 50 mg twice a day to help with bringing his target less than 130/80 mmHg.  But since he is pretty sure that his blood pressure is not elevated at home of asked the patient to bring records of his blood pressure recording as well as bring his home monitor system.  In the meantime he is going to send me information for the next week in my chart about his blood pressure reading.  His EF is improving his recent echo in July 2021 shows slightly improved EF of 40 to 45%.  He did not tolerate Entresto once restarted again due to fatigue.  He will remain on losartan, Coreg as well as [place].  He follows with the heart failure clinic now.  Hyperlipidemia-we will continue patient on his current atorvastatin 10 mg daily.  Morbid obesity-the patient understands the need to lose weight with diet and exercise. We have discussed specific strategies for this.  Diabetes mellitus he follows with an endocrinologist.  Chronic kidney disease we will continue to monitor the patient electrolytes and kidney function.  He also does follow with nephrology  The patient is in agreement with the above plan. The patient left the office in stable condition.  The patient will follow up in 8 weeks   Medication Adjustments/Labs and Tests Ordered: Current medicines are reviewed at length with the  patient today.  Concerns regarding medicines are outlined above.  No orders of the defined types were placed in this encounter.  No orders of the defined types were placed in this encounter.   Patient Instructions  Medication Instructions:  Your physician recommends that you continue on your current medications as directed. Please refer to the Current Medication list given to you today.  *If you need a refill on your cardiac medications before your next appointment, please call your pharmacy*   Lab Work: None If you have labs (blood work) drawn today and your tests are completely normal, you will receive your results only by: Dustin Mcclain MyChart Message (if you have MyChart) OR . A paper copy in the mail If you have any lab test that is abnormal or we need to change your treatment, we will call you to review the results.   Testing/Procedures: None   Follow-Up: At [facility], you and your health needs are our priority.  As part of our continuing mission to provide you with exceptional heart care, we have created designated Provider Care Teams.  These Care Teams include your primary Cardiologist (physician) and Advanced Practice Providers (APPs -  Physician Assistants and Nurse Practitioners) who all work together to provide you with the care you need, when you need it.  We recommend signing up for the patient portal called "MyChart".  Sign up information is provided on this After Visit Summary.  MyChart is used to connect with patients for Virtual Visits (Telemedicine).  Patients are able to view lab/test results, encounter notes, upcoming appointments, etc.  Non-urgent messages can be sent to your provider as well.   To learn more about what you can do with MyChart, go to [email or web address].  Your next appointment:   8 week(s)  The format for your next appointment:   In Person  Provider:   Berniece Salines, DO   Other Instructions      Adopting a Healthy Lifestyle.  Know  what a healthy weight is for you (roughly BMI <25) and aim to maintain this   Aim for 7+ servings of fruits and vegetables daily   65-80+ fluid ounces of water or unsweet tea for healthy kidneys   Limit to max 1 drink of alcohol per day; avoid smoking/tobacco   Limit animal fats in diet for cholesterol and heart health - choose grass fed whenever available   Avoid highly processed foods, and foods high in saturated/trans fats   Aim for low stress - take time to unwind and care for your mental health   Aim for 150 min of moderate intensity exercise weekly for heart health, and weights twice weekly for bone health   Aim for 7-9 hours of sleep daily   When it comes to diets, agreement about the perfect plan isnt easy to find, even among the experts. Experts at the [employer] developed an idea known as the Healthy Eating Plate. Just imagine a plate divided into logical, healthy portions.   The emphasis is on diet quality:   Load up on vegetables and fruits - one-half of your plate: Aim for color and variety, and remember that potatoes dont count.   Go for whole grains - one-quarter of your plate: Whole wheat, barley, wheat berries, quinoa, oats, brown rice, and foods made with them. If you want pasta, go with whole wheat pasta.   Protein power - one-quarter of your plate: Fish, chicken, beans, and nuts are all healthy, versatile protein sources. Limit red meat.   The diet, however, does go beyond the plate, offering a few other suggestions.   Use healthy plant oils, such as olive, canola, soy, corn, sunflower and peanut. Check the labels, and avoid partially hydrogenated oil, which have unhealthy trans fats.   If youre thirsty, drink water. Coffee and tea are good in moderation, but skip sugary drinks and limit milk and dairy products to one or two daily servings.   The type of carbohydrate in the diet is more important than the amount. Some sources of  carbohydrates, such as vegetables, fruits, whole grains, and beans-are healthier than others.   Finally, stay active  Signed, Berniece Salines, DO  12/18/2019 1:46 PM    [facility]

## 2019-12-18 NOTE — Patient Instructions (Signed)

## 2019-12-19 ENCOUNTER — Encounter: Payer: Self-pay | Admitting: Internal Medicine

## 2019-12-19 ENCOUNTER — Ambulatory Visit: Payer: Medicaid Other | Admitting: Internal Medicine

## 2019-12-19 DIAGNOSIS — G4733 Obstructive sleep apnea (adult) (pediatric): Secondary | ICD-10-CM | POA: Diagnosis not present

## 2019-12-19 NOTE — Patient Instructions (Signed)
Glad you are doing well with your CPAP. We can continue auto 5-12  I agree with you that losing some weight is a really important goal- good luck.  Please call if we can help

## 2019-12-19 NOTE — Progress Notes (Signed)
Subjective:    Patient ID: Dustin Mcclain, male    DOB: 24-May-1965, 54 y.o.   MRN: [id number]  HPI male never smoker followed for OSA/insomnia, complicated by obesity, HBP, cardiomyopathy, renal insufficiency, DM NPSG 08/15/16   AHI 10.8/ hr, desaturation to 83%, body weight 365 lbs   .-------------------------------------------------------------------------------------  12/20/2018- 54 year old male never smoker followed for OSA/insomnia, complicated by obesity, HBP, Cardiomyopathy/CHF, renal insufficiency, DM, GERD, CPAP auto 5-12/[employer] Download compliance 97%, AHI 1.5/ hr -----OSA on CPAP auto 5-12, DME: [employer]; no complaints He feels comfortable with his CPAP. Reviewed download. Not working now. Denies cough or wheeze. Cardiology follows for his cardiomyopathy and he understands he is stable.  12/19/19- 54 year old male never smoker followed for OSA/insomnia, complicated by obesity, HBP, Cardiomyopathy/CHF, renal insufficiency, DM, GERD, CKD3, CPAP auto 5-12/[employer] Download compliance 93%, AHI 1.6/ hr Body weight today - 372 lbs Had 2 Moderna Covax He feels much better off with CPAP. Doing very well.  Cardiac f/u tells him heart doing better on ECHO.  ROS-see HPI    + = positive Constitutional:   No-   weight loss, night sweats, fevers, chills, fatigue, lassitude. HEENT:   No-  headaches, difficulty swallowing, tooth/dental problems, sore throat,       No-  sneezing, itching, ear ache, nasal congestion, post nasal drip,  CV:  No-   chest pain, orthopnea, PND, swelling in lower extremities, anasarca,  dizziness, palpitations Resp:+shortness of breath with exertion or at rest.              No-   productive cough,  No non-productive cough,  No- coughing up of blood.              No-   change in color of mucus.  No- wheezing.   Skin: No-   rash or lesions. GI:  No-   heartburn, indigestion, abdominal pain, nausea, vomiting,  GU: . MS:  No-   joint pain or swelling.  Neuro-      nothing unusual Psych:  No- change in mood or affect. No depression or anxiety.  No memory loss.  Objective:   Physical Exam General- Alert, Oriented, Affect-appropriate, Distress- none acute   +morbidly obese Skin- rash-none, lesions- none, excoriation- none Lymphadenopathy- none Head- atraumatic            Eyes- Gross vision intact, PERRLA, conjunctivae clear secretions            Ears- Hearing, canals normal            Nose- Clear, no- Septal dev, mucus, polyps, erosion, perforation             Throat- Mallampati III-IV , mucosa clear , drainage- none, tonsils- atrophic Neck- flexible , trachea midline, no stridor , thyroid nl, carotid no bruit Chest - symmetrical excursion , unlabored           Heart/CV- RRR , no murmur , no gallop  , no rub, nl s1 s2                           - JVD- none , edema- none, stasis changes- none, varices- none           Lung- clear to P&A, wheeze- none, cough- none , dullness-none, rub- none           Chest wall-  Abd-  Br/ Gen/ Rectal- Not done, not indicated Extrem- cyanosis- none, clubbing, none, atrophy- none, strength- nl Neuro-  grossly intact to observation  Assessment & Plan:

## 2019-12-27 NOTE — Assessment & Plan Note (Addendum)
Continue efforts with diet and exercise. Consider Bariatric evaluation when cardiac status permits.

## 2019-12-27 NOTE — Assessment & Plan Note (Signed)
Benefits from CPAP with good compliance and control. Plan- continue auto 5-12 

## 2020-02-14 ENCOUNTER — Ambulatory Visit: Payer: Medicaid Other | Admitting: Cardiology

## 2020-02-20 ENCOUNTER — Other Ambulatory Visit: Payer: Self-pay

## 2020-02-20 ENCOUNTER — Encounter (HOSPITAL_COMMUNITY): Payer: Self-pay

## 2020-02-20 ENCOUNTER — Ambulatory Visit (HOSPITAL_COMMUNITY)
Admission: RE | Admit: 2020-02-20 | Discharge: 2020-02-20 | Disposition: A | Discharge status: Home / Self Care | Payer: Medicaid Other | Source: Ambulatory Visit | Attending: Cardiology | Admitting: Cardiology

## 2020-02-20 VITALS — BP 123/87 | HR 78 | Wt 361.4 lb

## 2020-02-20 DIAGNOSIS — Z79899 Other long term (current) drug therapy: Secondary | ICD-10-CM | POA: Diagnosis not present

## 2020-02-20 DIAGNOSIS — Z794 Long term (current) use of insulin: Secondary | ICD-10-CM | POA: Insufficient documentation

## 2020-02-20 DIAGNOSIS — G4733 Obstructive sleep apnea (adult) (pediatric): Secondary | ICD-10-CM | POA: Diagnosis not present

## 2020-02-20 DIAGNOSIS — I130 Hypertensive heart and chronic kidney disease with heart failure and stage 1 through stage 4 chronic kidney disease, or unspecified chronic kidney disease: Secondary | ICD-10-CM | POA: Insufficient documentation

## 2020-02-20 DIAGNOSIS — N183 Chronic kidney disease, stage 3 unspecified: Secondary | ICD-10-CM | POA: Diagnosis not present

## 2020-02-20 DIAGNOSIS — E1122 Type 2 diabetes mellitus with diabetic chronic kidney disease: Secondary | ICD-10-CM | POA: Insufficient documentation

## 2020-02-20 DIAGNOSIS — Z6841 Body Mass Index (BMI) 40.0 and over, adult: Secondary | ICD-10-CM | POA: Insufficient documentation

## 2020-02-20 DIAGNOSIS — I5022 Chronic systolic (congestive) heart failure: Secondary | ICD-10-CM | POA: Diagnosis not present

## 2020-02-20 DIAGNOSIS — E785 Hyperlipidemia, unspecified: Secondary | ICD-10-CM | POA: Diagnosis not present

## 2020-02-20 LAB — BASIC METABOLIC PANEL
Anion gap: 11 (ref 5–15)
BUN: 46 mg/dL — ABNORMAL HIGH (ref 6–20)
CO2: 21 mmol/L — ABNORMAL LOW (ref 22–32)
Calcium: 9.6 mg/dL (ref 8.9–10.3)
Chloride: 105 mmol/L (ref 98–111)
Creatinine, Ser: 2.82 mg/dL — ABNORMAL HIGH (ref 0.61–1.24)
GFR, Estimated: 25 mL/min — ABNORMAL LOW (ref >60)
Glucose, Bld: 228 mg/dL — ABNORMAL HIGH (ref 70–99)
Potassium: 4.8 mmol/L (ref 3.5–5.1)
Sodium: 137 mmol/L (ref 135–145)

## 2020-02-20 NOTE — Patient Instructions (Signed)
Labs done today, your results will be available in MyChart, we will contact you for abnormal readings.  Your physician recommends that you return for a follow up visit in 4 months with an echocardiogram  Your physician has requested that you have an echocardiogram. Echocardiography is a painless test that uses sound waves to create images of your heart. It provides your doctor with information about the size and shape of your heart and how well your heart's chambers and valves are working. This procedure takes approximately one hour. There are no restrictions for this procedure.  If you have any questions or concerns before your next appointment please send [place] a message through [place] or call our office at [phone].    TO LEAVE A MESSAGE FOR THE NURSE SELECT OPTION 2, PLEASE LEAVE A MESSAGE INCLUDING: . YOUR NAME . DATE OF BIRTH . CALL BACK NUMBER . REASON FOR CALL**this is important as we prioritize the call backs  YOU WILL RECEIVE A CALL BACK THE SAME DAY AS LONG AS YOU CALL BEFORE 4:00 PM

## 2020-02-20 NOTE — Progress Notes (Signed)
PCP: Dr. Glendon Axe HF Cardiology: Dr. Aundra Dubin  54 y.o. yo with CKD stage 3, DM, HTN, and chronic systolic CHF was initially referred by Dr. Tamala Julian at [facility] for evaluation of CHF. Patient was diagnosed with a cardiomyopathy as far back as 2007.  His mother and father both have CHF and have ICDs.  Uncle had a heart transplant.  He has had long-standing HTN and diabetes.  We have record of a Cardiolite in 2017 with EF 25%, no ischemia and an echo in 2017 with EF 35-40%.  He says that with better blood pressure control, his EF returned to normal range on an echo at outside facility.  He was on Entresto in the past and felt like it helped his symptoms, but he has been off it for as his insurance is not covering it.    Echo in 11/20 showed EF 35-40%, mild LV dilation, moderate LVH, moderately dilated RV with normal systolic function.   He was lost to followup in our clinic for about a year but was referred back by Dr. Harriet Masson. He had return f/u w/ Dr. Aundra Dubin 7/21 and Echo was repeated showing EF 40-45%, mild LVH, RV normal. He was felt to be mildly fluid overloaded at that visit. Wilder Glade was added to his regimen. Of note, he previously tried to take [facility] but developed severe fatigue with it and had to stop it. He is now on losartan.     He presents back to clinic for f/u. Feels "great" since starting Farxiga. He has lost over 20 lb over the last several months. Has made lifestyle changes. Exercising more and eating better. Denies resting dyspnea. Only slight exertional dyspnea w/ moderate level exercise. Denies CP. Reports full med compliance. Tolerating Wilder Glade ok w/o side effects. His BP is slightly elevated in clinic but tends to be elevated at doctor's offices but well controlled at home, averaging in the 120s-130s at home.   Labs (3/17): K 4.2, creatinine 1.57 Labs (5/20): LDL 118 Labs (4/21): K 5.5, creatinine 2.2 Labs (6/21): K 5, creatinine 2.19 Labs (7/21): K 4.7, creatinine 2.56    PMH: 1. OSA: Uses CPAP.  2. Type 2 diabetes 3. Morbid obesity 4. Hyperlipidemia 5. CKD stage 3: Sees Dr. Marval Regal.  6. Cardiomyopathy: Diagnosed in 2007. Presumed nonischemic, possibly related to long-standing HTN versus familial cardiomyopathy.  - Cardiolite in 2017: EF 25%, no ischemia.  - Echo (2017): EF 35-40%.  - Echo (11/20): EF 35-40%, mildly dilated LV, moderate LVH, moderate RV dilation with normal systolic function.  - Echo (7/21): EF 40-45%, mild LVH, RV normal.  7. HTN  Social History   Socioeconomic History  . Marital status: Divorced    Spouse name: Not on file  . Number of children: Not on file  . Years of education: Not on file  . Highest education level: Not on file  Occupational History  . Not on file  Tobacco Use  . Smoking status: Never Smoker  . Smokeless tobacco: Never Used  Vaping Use  . Vaping Use: Never used  Substance and Sexual Activity  . Alcohol use: Yes    Alcohol/week: 2.0 standard drinks    Types: 1 Cans of beer, 1 Shots of liquor per week    Comment: socially  . Drug use: Yes    Types: Marijuana    Comment: once weekly  . Sexual activity: Not on file  Other Topics Concern  . Not on file  Social History Narrative  . Not on  file   Social Determinants of Health   Financial Resource Strain:   . Difficulty of Paying Living Expenses: Not on file  Food Insecurity:   . Worried About Charity fundraiser in the Last Year: Not on file  . Ran Out of Food in the Last Year: Not on file  Transportation Needs:   . Lack of Transportation (Medical): Not on file  . Lack of Transportation (Non-Medical): Not on file  Physical Activity:   . Days of Exercise per Week: Not on file  . Minutes of Exercise per Session: Not on file  Stress:   . Feeling of Stress : Not on file  Social Connections:   . Frequency of Communication with Friends and Family: Not on file  . Frequency of Social Gatherings with Friends and Family: Not on file  . Attends  Religious Services: Not on file  . Active Member of Clubs or Organizations: Not on file  . Attends Archivist Meetings: Not on file  . Marital Status: Not on file  Intimate Partner Violence:   . Fear of Current or Ex-Partner: Not on file  . Emotionally Abused: Not on file  . Physically Abused: Not on file  . Sexually Abused: Not on file   FH: Uncle with heart transplant, mother with CHF/ICD, father with CHF/ICD  ROS: All systems reviewed and negative except as per HPI.   Current Outpatient Medications  Medication Sig Dispense Refill  . albuterol (PROVENTIL HFA;VENTOLIN HFA) 108 (90 BASE) MCG/ACT inhaler Inhale 2 puffs into the lungs every 4 (four) hours as needed for wheezing or shortness of breath.    Marland Kitchen atorvastatin (LIPITOR) 10 MG tablet Take 10 mg by mouth daily.    . carvedilol (COREG) 25 MG tablet Take 25 mg by mouth 2 (two) times daily with a meal.     . cholecalciferol (VITAMIN D) 1000 UNITS tablet Take 3,000 Units by mouth every morning.     . colchicine 0.6 MG tablet Take 0.6 mg by mouth as needed.     . dapagliflozin propanediol (FARXIGA) 10 MG TABS tablet Take 1 tablet (10 mg total) by mouth daily before breakfast. 30 tablet 11  . furosemide (LASIX) 40 MG tablet Take 1 tablet (40 mg total) by mouth daily. (Patient taking differently: Take 20 mg by mouth daily. ) 30 tablet 11  . hydrALAZINE (APRESOLINE) 100 MG tablet Take 1 tablet (100 mg total) by mouth 2 (two) times daily. 180 tablet 3  . insulin regular human CONCENTRATED (HUMULIN R) 500 UNIT/ML kwikpen Use as directed. Max daily dose is 300 units daily.    . isosorbide mononitrate (IMDUR) 30 MG 24 hr tablet Take 3 tablets (90 mg total) by mouth daily. 270 tablet 3  . Liraglutide (VICTOZA [place]) Inject into the skin. 1.2 mg at bedtime    . losartan (COZAAR) 50 MG tablet Take 1 tablet (50 mg total) by mouth daily. 30 tablet 11  . Magnesium 250 MG TABS Take 1 tablet by mouth every morning.     Marland Kitchen spironolactone  (ALDACTONE) 25 MG tablet Take 1 tablet (25 mg total) by mouth daily. 30 tablet 11  . Testosterone (ANDROGEL) 20.25 MG/1.25GM (1.62%) GEL Place 1 application onto the skin every morning.      No current facility-administered medications for this encounter.   BP (!) 146/98   Pulse 78   Wt (!) 163.9 kg (361 lb 5.8 oz)   SpO2 100%   BMI 50.40 kg/m  PHYSICAL EXAM: General:  Well appearing, obese. No respiratory difficulty HEENT: normal Neck: supple. no JVD. Carotids 2+ bilat; no bruits. No lymphadenopathy or thyromegaly appreciated. Cor: PMI nondisplaced. Regular rate & rhythm. No rubs, gallops or murmurs. Lungs: clear Abdomen: obese soft, nontender, nondistended. No hepatosplenomegaly. No bruits or masses. Good bowel sounds. Extremities: no cyanosis, clubbing, rash, edema Neuro: alert & oriented x 3, cranial nerves grossly intact. moves all 4 extremities w/o difficulty. Affect pleasant.   Assessment/Plan: 1. Cardiomyopathy/chronic systolic CHF: Uncertain etiology but suspect related to uncontrolled hypertension vs diabetes vs a familial cardiomyopathy given extensive family history.  He had a Cardiolite in 2017 that did not show ischemia.  Echo 7/21 EF up to 40-45%.  Stable NYHA class II symptoms. Euvolemic on exam today  - Continue Coreg 25 mg bid  - Continue spironolactone 25 mg daily.  - Continue hydralazine 100 mg bid (cannot remember to take tid)  - Continue  Imdur 60 mg daily.  - Continue losartan 50 mg daily, he is unable to tolerate Entresto due to fatigue.  - Contiknue dapagliflozin 10 mg daily - continue lasix 40 mg daily  - Check BMP today  - EF is out of ICD range.  2. HTN: mildy elevated in clinic but he reports BP is well controlled on home readings.  - continue current regimen  3. Type II diabetes: Continue [place].   4. Morbid obesity:  - he has lost ~20 lb through lifestyle change. congratulated on efforts - [facility]    5. OSA: Continue CPAP.  6. CKD: Stage 3.   - check BMP today   F/u in 3 months   Cristiano Capri Rosita Fire, PA-C  02/20/2020

## 2020-02-21 ENCOUNTER — Other Ambulatory Visit (HOSPITAL_COMMUNITY): Payer: Self-pay | Admitting: Cardiology

## 2020-02-21 DIAGNOSIS — I5022 Chronic systolic (congestive) heart failure: Secondary | ICD-10-CM

## 2020-02-21 MED ORDER — FUROSEMIDE 40 MG PO TABS: 40.0000 mg | ORAL_TABLET | ORAL | 11 refills | Status: AC | PRN

## 2020-03-02 ENCOUNTER — Other Ambulatory Visit: Payer: Self-pay

## 2020-03-04 ENCOUNTER — Other Ambulatory Visit: Payer: Self-pay

## 2020-03-04 ENCOUNTER — Encounter: Payer: Self-pay | Admitting: Cardiology

## 2020-03-04 ENCOUNTER — Ambulatory Visit (INDEPENDENT_AMBULATORY_CARE_PROVIDER_SITE_OTHER): Payer: Medicaid Other | Admitting: Cardiology

## 2020-03-04 VITALS — BP 128/86 | HR 80 | Ht 71.0 in | Wt 361.0 lb

## 2020-03-04 DIAGNOSIS — I420 Dilated cardiomyopathy: Secondary | ICD-10-CM | POA: Diagnosis not present

## 2020-03-04 DIAGNOSIS — Z6841 Body Mass Index (BMI) 40.0 and over, adult: Secondary | ICD-10-CM

## 2020-03-04 DIAGNOSIS — I519 Heart disease, unspecified: Secondary | ICD-10-CM

## 2020-03-04 DIAGNOSIS — I10 Essential (primary) hypertension: Secondary | ICD-10-CM | POA: Diagnosis not present

## 2020-03-04 DIAGNOSIS — G4733 Obstructive sleep apnea (adult) (pediatric): Secondary | ICD-10-CM | POA: Diagnosis not present

## 2020-03-04 DIAGNOSIS — E782 Mixed hyperlipidemia: Secondary | ICD-10-CM | POA: Diagnosis not present

## 2020-03-04 NOTE — Progress Notes (Signed)
Cardiology Office Note:    Date:  03/04/2020   ID:  Dustin Mcclain, DOB 1966-02-02, MRN [id number]  PCP:  Dustin Axe, MD  Cardiologist:  Dustin Salines, DO  Electrophysiologist:  None   Referring MD: Dustin Axe, MD   " I am doing well, I started on a new diet and has lost some weight"  History of Present Illness:    Dustin Lamar Jacksonis a 54 y.o.malewith a hx of history of hypertension, CKD stage III, hyperlipidemia, systolic heart failure, dilated cardiomyopathy EF recently improved EF 40 to 45% from 53 to 40%, morbid obesity.  I saw the patient in August 2021 at that time his blood pressure was slightly elevated but he had told me that his blood pressure at home was running in the 130s to 140s.  Therefore I held off on increasing his antihypertensive medications.  He did send me some blood pressure readings in the interim which showed that his systolics were in the 616.  Today the patient tells me that he has lost significant amount of weight close to 15 pounds.  He has had a new diet plan and has cut back on beer significantly.  Past Medical History:  Diagnosis Date  . Allergic rhinitis, cause unspecified   . Anemia in stage 3 chronic kidney disease [facility])    sees dr Arty Baumgartner every 3 months  . Anxiety state, unspecified   . Asthma    mild  . Benign neoplasm of descending colon 04/15/2014  . Cardiomyopathy ([place]) 07/11/2008   Qualifier: Diagnosis of  By: Annamaria Boots MD, Clinton D   . CHF 07/27/2008   Qualifier: Diagnosis of  By: Annamaria Boots MD, Clinton D   . CHF (congestive heart failure) ([place])    right side  . Chronic systolic congestive heart failure ([place]) 10/22/2015  . Depressive disorder, not elsewhere classified   . Diabetes mellitus without complication ([place])   . Dilated cardiomyopathy ([place]) 07/11/2008   Qualifier: Diagnosis of  By: Annamaria Boots MD, Kasandra Knudsen   Formatting of this note might be different from the original. EF 27% on nuclear scan showing no evidence of ischemia 5/17  . DM  10/23/2009   Qualifier: Diagnosis of  By: Annamaria Boots MD, Clinton D   . Essential hypertension 06/06/2008   Qualifier: Diagnosis of  By: Lockie Pares CMA, [name]    . GERD (gastroesophageal reflux disease)   . Insomnia 06/06/2008   Qualifier: Diagnosis of  By: Lockie Pares CMA, [name]    . Insomnia, unspecified   . Joint effusion 08/06/2013  . Mixed hyperlipidemia 10/03/2017  . Morbid obesity ([place])   . Obstructive sleep apnea 06/06/2008   NPSG 08/15/16   AHI 10.8/ hr, desaturation to 83%, body weight 365 lbs- requalified for CPAP   . Other primary cardiomyopathies   . Right knee pain 08/06/2013  . Seasonal allergic rhinitis 07/27/2008   Symptom patterns suggests primary sensitivity to spring grass pollens. Environmental precautions discussed. Potential candidate for sublingual therapy.    . Shortness of breath 10/19/2015  . Tendinitis    left wrist and elbow and left knee  . Unspecified disorder resulting from impaired renal function   . Unspecified essential hypertension   . Unspecified sleep apnea    cpap setting of 22 or 23    Past Surgical History:  Procedure Laterality Date  . APPENDECTOMY    . COLONOSCOPY WITH PROPOFOL N/A 04/15/2014   Procedure: COLONOSCOPY WITH PROPOFOL;  Surgeon: Irene Shipper, MD;  Location: [facility] ENDOSCOPY;  Service: Endoscopy;  Laterality: N/A;  . PATELLAR TENDON REPAIR     left    Current Medications: Current Meds  Medication Sig  . albuterol (PROVENTIL HFA;VENTOLIN HFA) 108 (90 BASE) MCG/ACT inhaler Inhale 2 puffs into the lungs every 4 (four) hours as needed for wheezing or shortness of breath.  Marland Kitchen amLODipine (NORVASC) 10 MG tablet Take 10 mg by mouth daily.  . ANDROGEL PUMP 20.25 MG/ACT (1.62%) GEL Apply topically.  Marland Kitchen atorvastatin (LIPITOR) 10 MG tablet Take 10 mg by mouth daily.  . carvedilol (COREG) 25 MG tablet Take 25 mg by mouth 2 (two) times daily with a meal.   . cholecalciferol (VITAMIN D) 1000 UNITS tablet Take 3,000 Units by mouth every morning.   . colchicine 0.6  MG tablet Take 0.6 mg by mouth as needed.   . dapagliflozin propanediol (FARXIGA) 10 MG TABS tablet Take 1 tablet (10 mg total) by mouth daily before breakfast.  . furosemide (LASIX) 40 MG tablet Take 1 tablet (40 mg total) by mouth as needed.  . insulin regular human CONCENTRATED (HUMULIN R) 500 UNIT/ML kwikpen Use as directed. Max daily dose is 300 units daily.  . isosorbide mononitrate (IMDUR) 30 MG 24 hr tablet Take 3 tablets (90 mg total) by mouth daily.  . Liraglutide (VICTOZA [place]) Inject into the skin. 1.2 mg at bedtime  . losartan (COZAAR) 50 MG tablet Take 1 tablet (50 mg total) by mouth daily.  . Magnesium 250 MG TABS Take 1 tablet by mouth every morning.   Marland Kitchen spironolactone (ALDACTONE) 25 MG tablet Take 1 tablet (25 mg total) by mouth daily.  . Testosterone (ANDROGEL) 20.25 MG/1.25GM (1.62%) GEL Place 1 application onto the skin every morning.   . VELTASSA 8.4 g packet Take 1 packet by mouth daily.     Allergies:   Patient has no known allergies.   Social History   Socioeconomic History  . Marital status: Divorced    Spouse name: Not on file  . Number of children: Not on file  . Years of education: Not on file  . Highest education level: Not on file  Occupational History  . Not on file  Tobacco Use  . Smoking status: Never Smoker  . Smokeless tobacco: Never Used  Vaping Use  . Vaping Use: Never used  Substance and Sexual Activity  . Alcohol use: Yes    Alcohol/week: 2.0 standard drinks    Types: 1 Cans of beer, 1 Shots of liquor per week    Comment: socially  . Drug use: Yes    Types: Marijuana    Comment: once weekly  . Sexual activity: Not on file  Other Topics Concern  . Not on file  Social History Narrative  . Not on file   Social Determinants of Health   Financial Resource Strain:   . Difficulty of Paying Living Expenses: Not on file  Food Insecurity:   . Worried About Charity fundraiser in the Last Year: Not on file  . Ran Out of Food in the Last  Year: Not on file  Transportation Needs:   . Lack of Transportation (Medical): Not on file  . Lack of Transportation (Non-Medical): Not on file  Physical Activity:   . Days of Exercise per Week: Not on file  . Minutes of Exercise per Session: Not on file  Stress:   . Feeling of Stress : Not on file  Social Connections:   . Frequency of Communication with Friends and Family: Not on file  .  Frequency of Social Gatherings with Friends and Family: Not on file  . Attends Religious Services: Not on file  . Active Member of Clubs or Organizations: Not on file  . Attends Archivist Meetings: Not on file  . Marital Status: Not on file     Family History: The patient's family history includes Diabetes in his father and mother; Heart disease in his father and mother.  ROS:   Review of Systems  Constitution: Negative for decreased appetite, fever and weight gain.  HENT: Negative for congestion, ear discharge, hoarse voice and sore throat.   Eyes: Negative for discharge, redness, vision loss in right eye and visual halos.  Cardiovascular: Negative for chest pain, dyspnea on exertion, leg swelling, orthopnea and palpitations.  Respiratory: Negative for cough, hemoptysis, shortness of breath and snoring.   Endocrine: Negative for heat intolerance and polyphagia.  Hematologic/Lymphatic: Negative for bleeding problem. Does not bruise/bleed easily.  Skin: Negative for flushing, nail changes, rash and suspicious lesions.  Musculoskeletal: Negative for arthritis, joint pain, muscle cramps, myalgias, neck pain and stiffness.  Gastrointestinal: Negative for abdominal pain, bowel incontinence, diarrhea and excessive appetite.  Genitourinary: Negative for decreased libido, genital sores and incomplete emptying.  Neurological: Negative for brief paralysis, focal weakness, headaches and loss of balance.  Psychiatric/Behavioral: Negative for altered mental status, depression and suicidal ideas.    Allergic/Immunologic: Negative for HIV exposure and persistent infections.    EKGs/Labs/Other Studies Reviewed:    The following studies were reviewed today:   EKG: None today   Recent Labs: 08/08/2019: Magnesium 1.7 11/04/2019: B Natriuretic Peptide 192.6 02/20/2020: BUN 46; Creatinine, Ser 2.82; Potassium 4.8; Sodium 137  Recent Lipid Panel    Component Value Date/Time   CHOL 191 09/27/2018 1010   TRIG 193 (H) 09/27/2018 1010   HDL 34 (L) 09/27/2018 1010   CHOLHDL 5.6 09/27/2018 1010   VLDL 39 09/27/2018 1010   LDLCALC 118 (H) 09/27/2018 1010    Physical Exam:    VS:  BP 128/86   Pulse 80   Ht 5\' 11"  (1.803 m)   Wt (!) 361 lb (163.7 kg)   SpO2 98%   BMI 50.35 kg/m     Wt Readings from Last 3 Encounters:  03/04/20 (!) 361 lb (163.7 kg)  02/20/20 (!) 361 lb 5.8 oz (163.9 kg)  12/19/19 (!) 372 lb 6.4 oz (168.9 kg)     GEN: Well nourished, well developed in no acute distress HEENT: Normal NECK: No JVD; No carotid bruits LYMPHATICS: No lymphadenopathy CARDIAC: S1S2 noted,RRR, no murmurs, rubs, gallops RESPIRATORY:  Clear to auscultation without rales, wheezing or rhonchi  ABDOMEN: Soft, non-tender, non-distended, +bowel sounds, no guarding. EXTREMITIES: No edema, No cyanosis, no clubbing MUSCULOSKELETAL:  No deformity  SKIN: Warm and dry NEUROLOGIC:  Alert and oriented x 3, non-focal PSYCHIATRIC:  Normal affect, good insight  ASSESSMENT:    1. Essential hypertension   2. Dilated cardiomyopathy ([place])   3. Obstructive sleep apnea   4. Mixed hyperlipidemia   5. Depressed left ventricular systolic function   6. Morbid obesity with BMI of 50.0-59.9, adult ([place])    PLAN:    His blood pressure deceptively in the office.  I am not going to change his antihypertensive regimen.  We will continue his current medication- with no change.  Continue with cpap. Continue with lipitor 10 mg daily DM per primary care provider.  He follows to see the advanced heart  failure clinic. He repeat 2d-echo  Is schedule for 06/23/2020.  Continue exercise and diet.  The patient is in agreement with the above plan. The patient left the office in stable condition.  The patient will follow up in   Medication Adjustments/Labs and Tests Ordered: Current medicines are reviewed at length with the patient today.  Concerns regarding medicines are outlined above.  No orders of the defined types were placed in this encounter.  No orders of the defined types were placed in this encounter.   Patient Instructions  Medication Instructions:  Your physician recommends that you continue on your current medications as directed. Please refer to the Current Medication list given to you today.  *If you need a refill on your cardiac medications before your next appointment, please call your pharmacy*   Lab Work: None If you have labs (blood work) drawn today and your tests are completely normal, you will receive your results only by: Marland Kitchen MyChart Message (if you have MyChart) OR . A paper copy in the mail If you have any lab test that is abnormal or we need to change your treatment, we will call you to review the results.   Testing/Procedures: None   Follow-Up: At [facility], you and your health needs are our priority.  As part of our continuing mission to provide you with exceptional heart care, we have created designated Provider Care Teams.  These Care Teams include your primary Cardiologist (physician) and Advanced Practice Providers (APPs -  Physician Assistants and Nurse Practitioners) who all work together to provide you with the care you need, when you need it.  We recommend signing up for the patient portal called "MyChart".  Sign up information is provided on this After Visit Summary.  MyChart is used to connect with patients for Virtual Visits (Telemedicine).  Patients are able to view lab/test results, encounter notes, upcoming appointments, etc.  Non-urgent  messages can be sent to your provider as well.   To learn more about what you can do with MyChart, go to [email or web address].    Your next appointment:   6 month(s)  The format for your next appointment:   In Person  Provider:   Berniece Salines, DO   Other Instructions      Adopting a Healthy Lifestyle.  Know what a healthy weight is for you (roughly BMI <25) and aim to maintain this   Aim for 7+ servings of fruits and vegetables daily   65-80+ fluid ounces of water or unsweet tea for healthy kidneys   Limit to max 1 drink of alcohol per day; avoid smoking/tobacco   Limit animal fats in diet for cholesterol and heart health - choose grass fed whenever available   Avoid highly processed foods, and foods high in saturated/trans fats   Aim for low stress - take time to unwind and care for your mental health   Aim for 150 min of moderate intensity exercise weekly for heart health, and weights twice weekly for bone health   Aim for 7-9 hours of sleep daily   When it comes to diets, agreement about the perfect plan isnt easy to find, even among the experts. Experts at the [employer] developed an idea known as the Healthy Eating Plate. Just imagine a plate divided into logical, healthy portions.   The emphasis is on diet quality:   Load up on vegetables and fruits - one-half of your plate: Aim for color and variety, and remember that potatoes dont count.   Go for whole grains - one-quarter of  your plate: Whole wheat, barley, wheat berries, quinoa, oats, brown rice, and foods made with them. If you want pasta, go with whole wheat pasta.   Protein power - one-quarter of your plate: Fish, chicken, beans, and nuts are all healthy, versatile protein sources. Limit red meat.   The diet, however, does go beyond the plate, offering a few other suggestions.   Use healthy plant oils, such as olive, canola, soy, corn, sunflower and peanut. Check the labels, and  avoid partially hydrogenated oil, which have unhealthy trans fats.   If youre thirsty, drink water. Coffee and tea are good in moderation, but skip sugary drinks and limit milk and dairy products to one or two daily servings.   The type of carbohydrate in the diet is more important than the amount. Some sources of carbohydrates, such as vegetables, fruits, whole grains, and beans-are healthier than others.   Finally, stay active  Signed, Dustin Salines, DO  03/04/2020 12:33 PM    [facility]

## 2020-03-04 NOTE — Patient Instructions (Signed)

## 2020-03-06 ENCOUNTER — Ambulatory Visit (HOSPITAL_COMMUNITY)
Admission: RE | Admit: 2020-03-06 | Discharge: 2020-03-06 | Disposition: A | Discharge status: Home / Self Care | Payer: Medicaid Other | Source: Ambulatory Visit | Attending: Internal Medicine | Admitting: Internal Medicine

## 2020-03-06 ENCOUNTER — Other Ambulatory Visit: Payer: Self-pay

## 2020-03-06 DIAGNOSIS — I5022 Chronic systolic (congestive) heart failure: Secondary | ICD-10-CM | POA: Diagnosis present

## 2020-03-06 LAB — BASIC METABOLIC PANEL
Anion gap: 12 (ref 5–15)
BUN: 49 mg/dL — ABNORMAL HIGH (ref 6–20)
CO2: 25 mmol/L (ref 22–32)
Calcium: 9.9 mg/dL (ref 8.9–10.3)
Chloride: 100 mmol/L (ref 98–111)
Creatinine, Ser: 2.87 mg/dL — ABNORMAL HIGH (ref 0.61–1.24)
GFR, Estimated: 26 mL/min — ABNORMAL LOW (ref >60)
Glucose, Bld: 212 mg/dL — ABNORMAL HIGH (ref 70–99)
Potassium: 5 mmol/L (ref 3.5–5.1)
Sodium: 137 mmol/L (ref 135–145)

## 2020-06-23 ENCOUNTER — Ambulatory Visit (HOSPITAL_BASED_OUTPATIENT_CLINIC_OR_DEPARTMENT_OTHER)
Admission: RE | Admit: 2020-06-23 | Discharge: 2020-06-23 | Disposition: A | Discharge status: Home / Self Care | Payer: Medicaid Other | Source: Ambulatory Visit | Attending: Cardiology | Admitting: Cardiology

## 2020-06-23 ENCOUNTER — Other Ambulatory Visit: Payer: Self-pay

## 2020-06-23 ENCOUNTER — Encounter (HOSPITAL_COMMUNITY): Payer: Self-pay | Admitting: Cardiology

## 2020-06-23 ENCOUNTER — Ambulatory Visit (HOSPITAL_COMMUNITY)
Admission: RE | Admit: 2020-06-23 | Discharge: 2020-06-23 | Disposition: A | Discharge status: Home / Self Care | Payer: Medicaid Other | Source: Ambulatory Visit | Attending: Cardiology | Admitting: Cardiology

## 2020-06-23 VITALS — BP 130/90 | HR 79 | Wt 356.4 lb

## 2020-06-23 DIAGNOSIS — Z7182 Exercise counseling: Secondary | ICD-10-CM | POA: Insufficient documentation

## 2020-06-23 DIAGNOSIS — E1122 Type 2 diabetes mellitus with diabetic chronic kidney disease: Secondary | ICD-10-CM | POA: Insufficient documentation

## 2020-06-23 DIAGNOSIS — I5022 Chronic systolic (congestive) heart failure: Secondary | ICD-10-CM

## 2020-06-23 DIAGNOSIS — Z6841 Body Mass Index (BMI) 40.0 and over, adult: Secondary | ICD-10-CM | POA: Diagnosis not present

## 2020-06-23 DIAGNOSIS — I429 Cardiomyopathy, unspecified: Secondary | ICD-10-CM | POA: Diagnosis not present

## 2020-06-23 DIAGNOSIS — Z8249 Family history of ischemic heart disease and other diseases of the circulatory system: Secondary | ICD-10-CM | POA: Insufficient documentation

## 2020-06-23 DIAGNOSIS — Z7901 Long term (current) use of anticoagulants: Secondary | ICD-10-CM | POA: Insufficient documentation

## 2020-06-23 DIAGNOSIS — N183 Chronic kidney disease, stage 3 unspecified: Secondary | ICD-10-CM | POA: Diagnosis not present

## 2020-06-23 DIAGNOSIS — G4733 Obstructive sleep apnea (adult) (pediatric): Secondary | ICD-10-CM | POA: Insufficient documentation

## 2020-06-23 DIAGNOSIS — I130 Hypertensive heart and chronic kidney disease with heart failure and stage 1 through stage 4 chronic kidney disease, or unspecified chronic kidney disease: Secondary | ICD-10-CM | POA: Diagnosis not present

## 2020-06-23 DIAGNOSIS — Z794 Long term (current) use of insulin: Secondary | ICD-10-CM | POA: Diagnosis not present

## 2020-06-23 DIAGNOSIS — Z79899 Other long term (current) drug therapy: Secondary | ICD-10-CM | POA: Insufficient documentation

## 2020-06-23 LAB — ECHOCARDIOGRAM COMPLETE
Area-P 1/2: 3.68 cm2
Calc EF: 43.1 %
S' Lateral: 4.9 cm
Single Plane A2C EF: 43.3 %
Single Plane A4C EF: 44.5 %

## 2020-06-23 LAB — BASIC METABOLIC PANEL
Anion gap: 10 (ref 5–15)
BUN: 37 mg/dL — ABNORMAL HIGH (ref 6–20)
CO2: 25 mmol/L (ref 22–32)
Calcium: 9.7 mg/dL (ref 8.9–10.3)
Chloride: 103 mmol/L (ref 98–111)
Creatinine, Ser: 2.43 mg/dL — ABNORMAL HIGH (ref 0.61–1.24)
GFR, Estimated: 31 mL/min — ABNORMAL LOW (ref >60)
Glucose, Bld: 208 mg/dL — ABNORMAL HIGH (ref 70–99)
Potassium: 4.6 mmol/L (ref 3.5–5.1)
Sodium: 138 mmol/L (ref 135–145)

## 2020-06-23 NOTE — Patient Instructions (Addendum)
Labs done today. We will contact you only if your labs are abnormal.  No medication changes were made. Please continue all current medications as prescribed.  Your physician recommends that you schedule a follow-up appointment in: 4 months our [facility]  If you have any questions or concerns before your next appointment please send [place] a message through [place] or call our office at [phone].    TO LEAVE A MESSAGE FOR THE NURSE SELECT OPTION 2, PLEASE LEAVE A MESSAGE INCLUDING: . YOUR NAME . DATE OF BIRTH . CALL BACK NUMBER . REASON FOR CALL**this is important as we prioritize the call backs  YOU WILL RECEIVE A CALL BACK THE SAME DAY AS LONG AS YOU CALL BEFORE 4:00 PM   Do the following things EVERYDAY: 1) Weigh yourself in the morning before breakfast. Write it down and keep it in a log. 2) Take your medicines as prescribed 3) Eat low salt foods--Limit salt (sodium) to 2000 mg per day.  4) Stay as active as you can everyday 5) Limit all fluids for the day to less than 2 liters   At the [facility], you and your health needs are our priority. As part of our continuing mission to provide you with exceptional heart care, we have created designated Provider Care Teams. These Care Teams include your primary Cardiologist (physician) and Advanced Practice Providers (APPs- Physician Assistants and Nurse Practitioners) who all work together to provide you with the care you need, when you need it.   You may see any of the following providers on your designated Care Team at your next follow up: Marland Kitchen Dr Glori Bickers . Dr Loralie Champagne . Darrick Grinder, NP . Lyda Jester, PA . Audry Riles, PharmD   Please be sure to bring in all your medications bottles to every appointment.

## 2020-06-23 NOTE — Progress Notes (Signed)
  Echocardiogram 2D Echocardiogram has been performed.  Geoffery Lyons Swaim 06/23/2020, 10:28 AM

## 2020-06-23 NOTE — Progress Notes (Signed)
PCP: Dr. Glendon Axe HF Cardiology: Dr. Aundra Dubin  55 y.o. yo with CKD stage 3, DM, HTN, and chronic systolic CHF was initially referred by Dr. Tamala Julian at [facility] for evaluation of CHF. Patient was diagnosed with a cardiomyopathy as far back as 2007.  His mother and father both have CHF and have ICDs.  Uncle had a heart transplant.  He has had long-standing HTN and diabetes.  We have record of a Cardiolite in 2017 with EF 25%, no ischemia and an echo in 2017 with EF 35-40%.  He says that with better blood pressure control, his EF returned to normal range on an echo at outside facility.  He was on Entresto in the past and felt like it helped his symptoms, but he has been off it for as his insurance is not covering it.   He was lost to followup in our clinic for about a year but was referred back by Dr. Harriet Masson.   Echo in 11/20 showed EF 35-40%, mild LV dilation, moderate LVH, moderately dilated RV with normal systolic function.   Echo in 7/21 showed EF 40-45%, mild LVH, RV normal. Echo was done today and reviewed, EF 45-50% with normal RV.   He returns for followup of CHF.  He was unable to tolerate low dose Entresto.  Weight down 5 lbs. No significant exertional dyspnea.  No orthopnea/PND. No chest pain. He has felt better since starting Farxiga.  Not exercising consistently. Has stayed off ETOH generally though he drank a couple of beers on Superbowl Sunday. He is using CPAP.   ECG (personally reviewed): NSR, left axis deviation      Labs (3/17): K 4.2, creatinine 1.57 Labs (5/20): LDL 118 Labs (4/21): K 5.5, creatinine 2.2 Labs (6/21): K 5, creatinine 2.19 Labs (10/21): K 5, creatinine 2.87  PMH: 1. OSA: Uses CPAP.  2. Type 2 diabetes 3. Morbid obesity 4. Hyperlipidemia 5. CKD stage 3: Sees Dr. Marval Regal.  6. Cardiomyopathy: Diagnosed in 2007. Presumed nonischemic, possibly related to long-standing HTN versus familial cardiomyopathy.  - Cardiolite in 2017: EF 25%, no ischemia.  -  Echo (2017): EF 35-40%.  - Echo (11/20): EF 35-40%, mildly dilated LV, moderate LVH, moderate RV dilation with normal systolic function.  - Echo (7/21): EF 40-45%, mild LVH, RV normal.  - Echo (2/22): EF 45-50% with normal RV.  7. HTN  Social History   Socioeconomic History  . Marital status: Divorced    Spouse name: Not on file  . Number of children: Not on file  . Years of education: Not on file  . Highest education level: Not on file  Occupational History  . Not on file  Tobacco Use  . Smoking status: Never Smoker  . Smokeless tobacco: Never Used  Vaping Use  . Vaping Use: Never used  Substance and Sexual Activity  . Alcohol use: Yes    Alcohol/week: 2.0 standard drinks    Types: 1 Cans of beer, 1 Shots of liquor per week    Comment: socially  . Drug use: Yes    Types: Marijuana    Comment: once weekly  . Sexual activity: Not on file  Other Topics Concern  . Not on file  Social History Narrative  . Not on file   Social Determinants of Health   Financial Resource Strain: Not on file  Food Insecurity: Not on file  Transportation Needs: Not on file  Physical Activity: Not on file  Stress: Not on file  Social Connections:  Not on file  Intimate Partner Violence: Not on file   FH: Uncle with heart transplant, mother with CHF/ICD, father with CHF/ICD  ROS: All systems reviewed and negative except as per HPI.   Current Outpatient Medications  Medication Sig Dispense Refill  . albuterol (PROVENTIL HFA;VENTOLIN HFA) 108 (90 BASE) MCG/ACT inhaler Inhale 2 puffs into the lungs every 4 (four) hours as needed for wheezing or shortness of breath.    Marland Kitchen amLODipine (NORVASC) 10 MG tablet Take 10 mg by mouth daily.    . ANDROGEL PUMP 20.25 MG/ACT (1.62%) GEL Apply topically.    Marland Kitchen atorvastatin (LIPITOR) 10 MG tablet Take 10 mg by mouth daily.    . carvedilol (COREG) 25 MG tablet Take 25 mg by mouth 2 (two) times daily with a meal.    . cholecalciferol (VITAMIN D) 1000 UNITS  tablet Take 3,000 Units by mouth every morning.     . colchicine 0.6 MG tablet Take 0.6 mg by mouth as needed.     . dapagliflozin propanediol (FARXIGA) 10 MG TABS tablet Take 1 tablet (10 mg total) by mouth daily before breakfast. 30 tablet 11  . furosemide (LASIX) 40 MG tablet Take 1 tablet (40 mg total) by mouth as needed. 30 tablet 11  . hydrALAZINE (APRESOLINE) 100 MG tablet Take 1 tablet (100 mg total) by mouth 2 (two) times daily. 180 tablet 3  . insulin regular human CONCENTRATED (HUMULIN R) 500 UNIT/ML kwikpen Use as directed. Max daily dose is 300 units daily.    . isosorbide mononitrate (IMDUR) 30 MG 24 hr tablet Take 30 mg by mouth in the morning and at bedtime.    . Liraglutide (VICTOZA [place]) Inject into the skin. 1.2 mg at bedtime    . losartan (COZAAR) 50 MG tablet Take 1 tablet (50 mg total) by mouth daily. 30 tablet 11  . Magnesium 250 MG TABS Take 1 tablet by mouth every morning.     Marland Kitchen spironolactone (ALDACTONE) 25 MG tablet Take 1 tablet (25 mg total) by mouth daily. 30 tablet 11  . Testosterone 20.25 MG/1.25GM (1.62%) GEL Place 1 application onto the skin every morning.     . VELTASSA 8.4 g packet Take 1 packet by mouth daily.     No current facility-administered medications for this encounter.   BP 130/90   Pulse 79   Wt (!) 161.7 kg (356 lb 6.4 oz)   SpO2 99%   BMI 49.71 kg/m  General: NAD, obese.  Neck: No JVD, no thyromegaly or thyroid nodule.  Lungs: Clear to auscultation bilaterally with normal respiratory effort. CV: Nondisplaced PMI.  Heart regular S1/S2, no S3/S4, no murmur.  Trace ankle edema.  No carotid bruit.  Normal pedal pulses.  Abdomen: Soft, nontender, no hepatosplenomegaly, no distention.  Skin: Intact without lesions or rashes.  Neurologic: Alert and oriented x 3.  Psych: Normal affect. Extremities: No clubbing or cyanosis.  HEENT: Normal.   Assessment/Plan: 1. Cardiomyopathy/chronic systolic CHF: Uncertain etiology but suspect related to  uncontrolled hypertension vs diabetes vs a familial cardiomyopathy given extensive family history.  He had a Cardiolite in 2017 that did not show ischemia.  Echo was done today and reviewed, EF up to 45-50%.  NYHA class II symptoms, not volume overloaded on exam.  - Continue Coreg 25 mg bid.  - Continue spironolactone 25 mg daily. He has needed Veltassa in the past but is currently off.  BMET today.  - Continue hydralazine 100 mg bid (cannot remember to take tid)  and Imdur 60 mg daily.  - Continue losartan 50 mg daily, he is unable to tolerate Entresto due to fatigue.  - Continue dapagliflozin 10 mg daily.   - EF is out of ICD range.  2. HTN: BP controlled.  3. Type II diabetes: He is on [place].   4. Morbid obesity: Gradually losing weight.   - Continue diet and exercise efforts.     5. OSA: Continue CPAP.  6. CKD: Stage 3.  Check BMET today.  Sees Dr. Marval Regal.   Followup 4 months with NP/PA.   Loralie Champagne 06/23/2020

## 2020-08-24 ENCOUNTER — Other Ambulatory Visit: Payer: Self-pay

## 2020-08-24 DIAGNOSIS — D631 Anemia in chronic kidney disease: Secondary | ICD-10-CM | POA: Insufficient documentation

## 2020-08-24 DIAGNOSIS — I428 Other cardiomyopathies: Secondary | ICD-10-CM | POA: Insufficient documentation

## 2020-08-24 DIAGNOSIS — N183 Chronic kidney disease, stage 3 unspecified: Secondary | ICD-10-CM | POA: Insufficient documentation

## 2020-08-24 DIAGNOSIS — J309 Allergic rhinitis, unspecified: Secondary | ICD-10-CM | POA: Insufficient documentation

## 2020-08-24 DIAGNOSIS — J45909 Unspecified asthma, uncomplicated: Secondary | ICD-10-CM | POA: Insufficient documentation

## 2020-08-24 DIAGNOSIS — G4730 Sleep apnea, unspecified: Secondary | ICD-10-CM | POA: Insufficient documentation

## 2020-08-24 DIAGNOSIS — I429 Cardiomyopathy, unspecified: Secondary | ICD-10-CM | POA: Insufficient documentation

## 2020-08-24 DIAGNOSIS — K219 Gastro-esophageal reflux disease without esophagitis: Secondary | ICD-10-CM | POA: Insufficient documentation

## 2020-08-24 DIAGNOSIS — M779 Enthesopathy, unspecified: Secondary | ICD-10-CM | POA: Insufficient documentation

## 2020-08-24 DIAGNOSIS — I509 Heart failure, unspecified: Secondary | ICD-10-CM | POA: Insufficient documentation

## 2020-08-24 DIAGNOSIS — G4700 Insomnia, unspecified: Secondary | ICD-10-CM | POA: Insufficient documentation

## 2020-08-24 DIAGNOSIS — I10 Essential (primary) hypertension: Secondary | ICD-10-CM | POA: Insufficient documentation

## 2020-08-24 DIAGNOSIS — E119 Type 2 diabetes mellitus without complications: Secondary | ICD-10-CM | POA: Insufficient documentation

## 2020-08-25 ENCOUNTER — Other Ambulatory Visit: Payer: Self-pay

## 2020-08-25 ENCOUNTER — Ambulatory Visit (INDEPENDENT_AMBULATORY_CARE_PROVIDER_SITE_OTHER): Payer: Medicaid Other | Admitting: Cardiology

## 2020-08-25 ENCOUNTER — Encounter: Payer: Self-pay | Admitting: Cardiology

## 2020-08-25 VITALS — BP 122/84 | HR 80 | Ht 71.0 in | Wt 353.0 lb

## 2020-08-25 DIAGNOSIS — N1831 Chronic kidney disease, stage 3a: Secondary | ICD-10-CM

## 2020-08-25 DIAGNOSIS — E782 Mixed hyperlipidemia: Secondary | ICD-10-CM

## 2020-08-25 DIAGNOSIS — I10 Essential (primary) hypertension: Secondary | ICD-10-CM

## 2020-08-25 DIAGNOSIS — I420 Dilated cardiomyopathy: Secondary | ICD-10-CM | POA: Diagnosis not present

## 2020-08-25 DIAGNOSIS — E119 Type 2 diabetes mellitus without complications: Secondary | ICD-10-CM | POA: Diagnosis not present

## 2020-08-25 DIAGNOSIS — Z6841 Body Mass Index (BMI) 40.0 and over, adult: Secondary | ICD-10-CM

## 2020-08-25 NOTE — Progress Notes (Signed)
Cardiology Office Note:    Date:  08/25/2020   ID:  Dustin Mcclain, DOB [birth year], MRN [id number]  PCP:  Dustin Axe, MD  Cardiologist:  Dustin Salines, DO  Electrophysiologist:  None   Referring MD: Dustin Axe, MD   I am doing better  History of Present Illness:    Dustin Mcclain is a 55 y.o. male with a hx of of history of hypertension, CKD stage III, hyperlipidemia, systolic heart failure, dilated cardiomyopathy EFrecently improved EF to 45 to 50%.  I saw the patient in August 2021 at that time his blood pressure was slightly elevated but he had told me that his blood pressure at home was running in the 130s to 140s.  Therefore I held off on increasing his antihypertensive medications.  He did send me some blood pressure readings in the interim which showed that his systolics were in the 945.  The patient in October 2021 at that time he was scheduled for his echocardiogram in February and follow-up with our heart failure clinic.  He was doing well no changes were made to his medication regimen.  Since I last saw the patient he had seen Dustin Mcclain the heart failure clinic.  No medication changes were made.  He was noted to have improvement in his EF to 45 to 50%.    Here for complaints today.   Past Medical History:  Diagnosis Date  . Allergic rhinitis   . Allergic rhinitis, cause unspecified   . Anemia in stage 3 chronic kidney disease [facility])    sees dr Dustin Mcclain every 3 months  . Anxiety state, unspecified   . Asthma    mild  . Benign neoplasm of descending colon 04/15/2014  . Cardiomyopathy ([place]) 07/11/2008   Qualifier: Diagnosis of  Dustin Mcclain   . CHF 07/27/2008   Qualifier: Diagnosis of  Dustin Mcclain   . CHF (congestive heart failure) ([place])    right side  . Chronic systolic congestive heart failure ([place]) 10/22/2015  . Depressed left ventricular systolic function 07/15/8826  . Depressive disorder, not elsewhere classified   . Diabetes mellitus  without complication ([place])   . Dilated cardiomyopathy ([place]) 07/11/2008   Qualifier: Diagnosis of  Dustin Mcclain   Formatting of this note might be different from the original. EF 27% on nuclear scan showing no evidence of ischemia 5/17  . DM 10/23/2009   Qualifier: Diagnosis of  Dustin Mcclain   . Essential (primary) hypertension   . Essential hypertension 06/06/2008   Qualifier: Diagnosis of  Dustin Mcclain    . GERD (gastroesophageal reflux disease)   . Insomnia 06/06/2008   Qualifier: Diagnosis of  Dustin Mcclain    . Insomnia, unspecified   . Joint effusion 08/06/2013  . Mixed hyperlipidemia 10/03/2017  . Morbid obesity ([place])   . Morbid obesity with BMI of 50.0-59.9, adult ([place]) 04/12/2019  . Noncompliance with medication regimen 10/22/2015  . Obstructive sleep apnea 06/06/2008   NPSG 08/15/16   AHI 10.8/ hr, desaturation to 83%, body weight 365 lbs- requalified for CPAP   . Other primary cardiomyopathies   . Primary cardiomyopathy ([place])   . Right knee pain 08/06/2013  . Seasonal allergic rhinitis 07/27/2008   Symptom patterns suggests primary sensitivity to spring grass pollens. Environmental precautions discussed. Potential candidate for sublingual therapy.    . Shortness of breath 10/19/2015  . Sleep apnea    cpap  setting of 22 or 23  . Tendinitis    left wrist and elbow and left knee  . Unspecified disorder resulting from impaired renal function   . Unspecified essential hypertension   . Unspecified sleep apnea    cpap setting of 22 or 23    Past Surgical History:  Procedure Laterality Date  . APPENDECTOMY    . COLONOSCOPY WITH PROPOFOL N/A 04/15/2014   Procedure: COLONOSCOPY WITH PROPOFOL;  Surgeon: Dustin Shipper, MD;  Location: [facility] ENDOSCOPY;  Service: Endoscopy;  Laterality: N/A;  . PATELLAR TENDON REPAIR     left    Current Medications: Current Meds  Medication Sig  . albuterol (PROVENTIL HFA;VENTOLIN HFA) 108 (90 BASE) MCG/ACT inhaler  Inhale 2 puffs into the lungs every 4 (four) hours as needed for wheezing or shortness of breath.  Marland Kitchen amLODipine (NORVASC) 10 MG tablet Take 10 mg by mouth daily.  . ANDROGEL PUMP 20.25 MG/ACT (1.62%) GEL Apply topically.  Marland Kitchen atorvastatin (LIPITOR) 10 MG tablet Take 10 mg by mouth daily.  . carvedilol (COREG) 25 MG tablet Take 25 mg by mouth 2 (two) times daily with a meal.  . cholecalciferol (VITAMIN Mcclain) 1000 UNITS tablet Take 3,000 Units by mouth every morning.   . colchicine 0.6 MG tablet Take 0.6 mg by mouth as needed.   . dapagliflozin propanediol (FARXIGA) 10 MG TABS tablet Take 1 tablet (10 mg total) by mouth daily before breakfast.  . furosemide (LASIX) 40 MG tablet Take 1 tablet (40 mg total) by mouth as needed.  . insulin regular human CONCENTRATED (HUMULIN R) 500 UNIT/ML kwikpen Use as directed. Max daily dose is 300 units daily.  . isosorbide mononitrate (IMDUR) 30 MG 24 hr tablet Take 30 mg by mouth in the morning and at bedtime.  Marland Kitchen losartan (COZAAR) 50 MG tablet Take 1 tablet (50 mg total) by mouth daily.  . Magnesium 250 MG TABS Take 1 tablet by mouth every morning.   Marland Kitchen spironolactone (ALDACTONE) 25 MG tablet Take 1 tablet (25 mg total) by mouth daily.  . Testosterone 20.25 MG/1.25GM (1.62%) GEL Place 1 application onto the skin every morning.   . [DISCONTINUED] liraglutide (VICTOZA) 18 MG/3ML SOPN Inject 0.3 mg into the skin daily.  . [DISCONTINUED] VELTASSA 8.4 g packet Take 1 packet by mouth daily.     Allergies:   Patient has no known allergies.   Social History   Socioeconomic History  . Marital status: Divorced    Spouse name: Not on file  . Number of children: Not on file  . Years of education: Not on file  . Highest education level: Not on file  Occupational History  . Not on file  Tobacco Use  . Smoking status: Never Smoker  . Smokeless tobacco: Never Used  Vaping Use  . Vaping Use: Never used  Substance and Sexual Activity  . Alcohol use: Yes     Alcohol/week: 2.0 standard drinks    Types: 1 Cans of beer, 1 Shots of liquor per week    Comment: socially  . Drug use: Yes    Types: Marijuana    Comment: once weekly  . Sexual activity: Not on file  Other Topics Concern  . Not on file  Social History Narrative  . Not on file   Social Determinants of Health   Financial Resource Strain: Not on file  Food Insecurity: Not on file  Transportation Needs: Not on file  Physical Activity: Not on file  Stress: Not on file  Social Connections: Not on file     Family History: The patient's family history includes Diabetes in his father and mother; Heart disease in his father and mother.  ROS:   Review of Systems  Constitution: Negative for decreased appetite, fever and weight gain.  HENT: Negative for congestion, ear discharge, hoarse voice and sore throat.   Eyes: Negative for discharge, redness, vision loss in right eye and visual halos.  Cardiovascular: Negative for chest pain, dyspnea on exertion, leg swelling, orthopnea and palpitations.  Respiratory: Negative for cough, hemoptysis, shortness of breath and snoring.   Endocrine: Negative for heat intolerance and polyphagia.  Hematologic/Lymphatic: Negative for bleeding problem. Does not bruise/bleed easily.  Skin: Negative for flushing, nail changes, rash and suspicious lesions.  Musculoskeletal: Negative for arthritis, joint pain, muscle cramps, myalgias, neck pain and stiffness.  Gastrointestinal: Negative for abdominal pain, bowel incontinence, diarrhea and excessive appetite.  Genitourinary: Negative for decreased libido, genital sores and incomplete emptying.  Neurological: Negative for brief paralysis, focal weakness, headaches and loss of balance.  Psychiatric/Behavioral: Negative for altered mental status, depression and suicidal ideas.  Allergic/Immunologic: Negative for HIV exposure and persistent infections.    EKGs/Labs/Other Studies Reviewed:    The following  studies were reviewed today:   EKG:  The ekg ordered today demonstrates   Recent Labs: 11/04/2019: B Natriuretic Peptide 192.6 06/23/2020: BUN 37; Creatinine, Ser 2.43; Potassium 4.6; Sodium 138  Recent Lipid Panel    Component Value Date/Time   CHOL 191 09/27/2018 1010   TRIG 193 (H) 09/27/2018 1010   HDL 34 (L) 09/27/2018 1010   CHOLHDL 5.6 09/27/2018 1010   VLDL 39 09/27/2018 1010   LDLCALC 118 (H) 09/27/2018 1010    Physical Exam:    VS:  BP 122/84   Pulse 80   Ht 5\' 11"  (1.803 m)   Wt (!) 353 lb 0.6 oz (160.1 kg)   SpO2 98%   BMI 49.24 kg/m     Wt Readings from Last 3 Encounters:  08/25/20 (!) 353 lb 0.6 oz (160.1 kg)  06/23/20 (!) 356 lb 6.4 oz (161.7 kg)  03/04/20 (!) 361 lb (163.7 kg)     GEN: Well nourished, well developed in no acute distress HEENT: Normal NECK: No JVD; No carotid bruits LYMPHATICS: No lymphadenopathy CARDIAC: S1S2 noted,RRR, no murmurs, rubs, gallops RESPIRATORY:  Clear to auscultation without rales, wheezing or rhonchi  ABDOMEN: Soft, non-tender, non-distended, +bowel sounds, no guarding. EXTREMITIES: No edema, No cyanosis, no clubbing MUSCULOSKELETAL:  No deformity  SKIN: Warm and dry NEUROLOGIC:  Alert and oriented x 3, non-focal PSYCHIATRIC:  Normal affect, good insight  ASSESSMENT:    1. Dilated cardiomyopathy ([place])   2. Hypertension, unspecified type   3. Essential hypertension   4. Diabetes mellitus without complication ([place])   5. Morbid obesity with BMI of 50.0-59.9, adult ([place])   6. Mixed hyperlipidemia   7. Stage 3a chronic kidney disease ([place])    PLAN:      His ejection fraction has improved now 45 to 50% his recent echo done in February 2022.  His cardiomyopathy is not thought to be ischemic.  Suspect uncontrolled hypertension versus familiar cardiomyopathy versus diabetes.  Thankfully his EF had improved therefore there is no need to pursue ICD implantation.  His blood pressure acceptable in the office no changes  will be made to his antihypertensive regimen.  He has been working on his weight and has lost some weight since I last saw the patient.  I recommended healthy  wellness program for the patient for now has declined.  This is being managed by his primary care doctor.  No adjustments for antidiabetic medications were made today.   He follows with nephrology for his CKD.  The patient is in agreement with the above plan. The patient left the office in stable condition.  The patient will follow up in 6 months or sooner as needed.   Medication Adjustments/Labs and Tests Ordered: Current medicines are reviewed at length with the patient today.  Concerns regarding medicines are outlined above.  No orders of the defined types were placed in this encounter.  No orders of the defined types were placed in this encounter.   Patient Instructions  Medication Instructions:  Your physician recommends that you continue on your current medications as directed. Please refer to the Current Medication list given to you today.  *If you need a refill on your cardiac medications before your next appointment, please call your pharmacy*   Lab Work: None If you have labs (blood work) drawn today and your tests are completely normal, you will receive your results only by: Marland Kitchen MyChart Message (if you have MyChart) OR . A paper copy in the mail If you have any lab test that is abnormal or we need to change your treatment, we will call you to review the results.   Testing/Procedures: None   Follow-Up: At [facility], you and your health needs are our priority.  As part of our continuing mission to provide you with exceptional heart care, we have created designated Provider Care Teams.  These Care Teams include your primary Cardiologist (physician) and Advanced Practice Providers (APPs -  Physician Assistants and Nurse Practitioners) who all work together to provide you with the care you need, when you need  it.  We recommend signing up for the patient portal called "MyChart".  Sign up information is provided on this After Visit Summary.  MyChart is used to connect with patients for Virtual Visits (Telemedicine).  Patients are able to view lab/test results, encounter notes, upcoming appointments, etc.  Non-urgent messages can be sent to your provider as well.   To learn more about what you can do with MyChart, go to [email or web address].    Your next appointment:   6 month(s)  The format for your next appointment:   In Person  Provider:   Berniece Salines, DO   Other Instructions      Adopting a Healthy Lifestyle.  Know what a healthy weight is for you (roughly BMI <25) and aim to maintain this   Aim for 7+ servings of fruits and vegetables daily   65-80+ fluid ounces of water or unsweet tea for healthy kidneys   Limit to max 1 drink of alcohol per day; avoid smoking/tobacco   Limit animal fats in diet for cholesterol and heart health - choose grass fed whenever available   Avoid highly processed foods, and foods high in saturated/trans fats   Aim for low stress - take time to unwind and care for your mental health   Aim for 150 min of moderate intensity exercise weekly for heart health, and weights twice weekly for bone health   Aim for 7-9 hours of sleep daily   When it comes to diets, agreement about the perfect plan isnt easy to find, even among the experts. Experts at the [employer] developed an idea known as the Healthy Eating Plate. Just imagine a plate divided into logical, healthy portions.  The emphasis is on diet quality:   Load up on vegetables and fruits - one-half of your plate: Aim for color and variety, and remember that potatoes dont count.   Go for whole grains - one-quarter of your plate: Whole wheat, barley, wheat berries, quinoa, oats, brown rice, and foods made with them. If you want pasta, go with whole wheat pasta.   Protein  power - one-quarter of your plate: Fish, chicken, beans, and nuts are all healthy, versatile protein sources. Limit red meat.   The diet, however, does go beyond the plate, offering a few other suggestions.   Use healthy plant oils, such as olive, canola, soy, corn, sunflower and peanut. Check the labels, and avoid partially hydrogenated oil, which have unhealthy trans fats.   If youre thirsty, drink water. Coffee and tea are good in moderation, but skip sugary drinks and limit milk and dairy products to one or two daily servings.   The type of carbohydrate in the diet is more important than the amount. Some sources of carbohydrates, such as vegetables, fruits, whole grains, and beans-are healthier than others.   Finally, stay active  Signed, Dustin Salines, DO  08/25/2020 1:02 PM    [facility]

## 2020-08-25 NOTE — Patient Instructions (Signed)

## 2020-09-02 ENCOUNTER — Ambulatory Visit: Payer: Medicaid Other | Admitting: Cardiology

## 2020-10-07 ENCOUNTER — Other Ambulatory Visit: Payer: Self-pay | Admitting: Cardiology

## 2020-10-07 NOTE — Telephone Encounter (Signed)
Refill sent to pharmacy.   

## 2020-10-16 ENCOUNTER — Telehealth: Payer: Self-pay | Admitting: Cardiology

## 2020-10-16 MED ORDER — HYDRALAZINE HCL 100 MG PO TABS: ORAL_TABLET | ORAL | 3 refills | Status: DC

## 2020-10-16 NOTE — Telephone Encounter (Signed)
*  STAT* If patient is at the pharmacy, call can be transferred to refill team.   1. Which medications need to be refilled? (please list name of each medication and dose if known)  hydrALAZINE (APRESOLINE) 100 MG tablet 2. Which pharmacy/location (including street and city if local pharmacy) is medication to be sent to? [id number] - [place], [place] - [address] AT [facility] [address] 3. Do they need a 30 day or 90 day supply?  90 day supply    Pt is all out medication

## 2020-10-16 NOTE — Telephone Encounter (Signed)
Refill sent in per request.  

## 2020-10-22 ENCOUNTER — Encounter (HOSPITAL_COMMUNITY): Payer: Medicaid Other

## 2020-10-23 NOTE — Progress Notes (Signed)
PCP: Dr. Glendon Axe Endo: Dr. Posey Pronto Nephrologist: Dr. Arty Baumgartner HF Cardiology: Dr. Aundra Dubin  55 y.o. yo with CKD stage 3, DM, HTN, and chronic systolic CHF was initially referred by Dr. Tamala Julian at [facility] for evaluation of CHF. Patient was diagnosed with a cardiomyopathy as far back as 2007.  His mother and father both have CHF and have ICDs.  Uncle had a heart transplant.  He has had long-standing HTN and diabetes.  We have record of a Cardiolite in 2017 with EF 25%, no ischemia and an echo in 2017 with EF 35-40%.  He says that with better blood pressure control, his EF returned to normal range on an echo at outside facility.  He was on Entresto in the past and felt like it helped his symptoms, but he has been off it for as his insurance is not covering it.   He was lost to followup in our clinic for about a year but was referred back by Dr. Harriet Masson.   Echo in 11/20 showed EF 35-40%, mild LV dilation, moderate LVH, moderately dilated RV with normal systolic function.   Echo in 7/21 showed EF 40-45%, mild LVH, RV normal. Echo was done today and reviewed, EF 45-50% with normal RV.   Today he returns for HF follow up. Last clinic visit 2/22 his weight was down 5 lbs and he was euvolemic. Overall feeling fine today. Denies increasing SOB, CP, dizziness, edema, or PND/Orthopnea. Appetite ok. No fever or chills. Weight at home ~352 pounds. Taking all medications. He is using CPAP. Working out at [employer] with his son, 3-4x/week. Father's day cook out on Saturday he had a few beers.  ECG (personally reviewed): NSR, 79 bpm.     Labs (3/17): K 4.2, creatinine 1.57 Labs (5/20): LDL 118 Labs (4/21): K 5.5, creatinine 2.2 Labs (6/21): K 5, creatinine 2.19 Labs (10/21): K 5, creatinine 2.87 Labs (6/22): K 4.7, creatinine 2.54  PMH: 1. OSA: Uses CPAP.  2. Type 2 diabetes 3. Morbid obesity 4. Hyperlipidemia 5. CKD stage 3: Sees Dr. Marval Regal.  6. Cardiomyopathy: Diagnosed in 2007. Presumed  nonischemic, possibly related to long-standing HTN versus familial cardiomyopathy.  - Cardiolite in 2017: EF 25%, no ischemia.  - Echo (2017): EF 35-40%.  - Echo (11/20): EF 35-40%, mildly dilated LV, moderate LVH, moderate RV dilation with normal systolic function.  - Echo (7/21): EF 40-45%, mild LVH, RV normal.  - Echo (2/22): EF 45-50% with normal RV.  7. HTN  Social History   Socioeconomic History   Marital status: Divorced    Spouse name: Not on file   Number of children: Not on file   Years of education: Not on file   Highest education level: Not on file  Occupational History   Not on file  Tobacco Use   Smoking status: Never   Smokeless tobacco: Never  Vaping Use   Vaping Use: Never used  Substance and Sexual Activity   Alcohol use: Yes    Alcohol/week: 2.0 standard drinks    Types: 1 Cans of beer, 1 Shots of liquor per week    Comment: socially   Drug use: Yes    Types: Marijuana    Comment: once weekly   Sexual activity: Not on file  Other Topics Concern   Not on file  Social History Narrative   Not on file   Social Determinants of Health   Financial Resource Strain: Not on file  Food Insecurity: Not on file  Transportation  Needs: Not on file  Physical Activity: Not on file  Stress: Not on file  Social Connections: Not on file  Intimate Partner Violence: Not on file   FH: Uncle with heart transplant, mother with CHF/ICD, father with CHF/ICD  ROS: All systems reviewed and negative except as per HPI.   Current Outpatient Medications  Medication Sig Dispense Refill   albuterol (PROVENTIL HFA;VENTOLIN HFA) 108 (90 BASE) MCG/ACT inhaler Inhale 2 puffs into the lungs every 4 (four) hours as needed for wheezing or shortness of breath.     amLODipine (NORVASC) 10 MG tablet Take 10 mg by mouth daily.     ANDROGEL PUMP 20.25 MG/ACT (1.62%) GEL Apply topically.     atorvastatin (LIPITOR) 10 MG tablet Take 10 mg by mouth daily.     carvedilol (COREG) 25 MG tablet  Take 25 mg by mouth 2 (two) times daily with a meal.     cholecalciferol (VITAMIN D) 1000 UNITS tablet Take 3,000 Units by mouth every morning.      colchicine 0.6 MG tablet Take 0.6 mg by mouth as needed.      dapagliflozin propanediol (FARXIGA) 10 MG TABS tablet Take 1 tablet (10 mg total) by mouth daily before breakfast. 30 tablet 11   furosemide (LASIX) 40 MG tablet Take 1 tablet (40 mg total) by mouth as needed. 30 tablet 11   hydrALAZINE (APRESOLINE) 100 MG tablet TAKE 1 TABLET(100 MG) BY MOUTH TWICE DAILY 180 tablet 3   insulin regular human CONCENTRATED (HUMULIN R) 500 UNIT/ML kwikpen Use as directed. Max daily dose is 300 units daily.     isosorbide mononitrate (IMDUR) 30 MG 24 hr tablet Take 30 mg by mouth in the morning and at bedtime.     losartan (COZAAR) 50 MG tablet Take 1 tablet (50 mg total) by mouth daily. 30 tablet 11   Magnesium 250 MG TABS Take 1 tablet by mouth every morning.      spironolactone (ALDACTONE) 25 MG tablet Take 1 tablet (25 mg total) by mouth daily. 30 tablet 11   Testosterone 20.25 MG/1.25GM (1.62%) GEL Place 1 application onto the skin every morning.      No current facility-administered medications for this encounter.   Wt Readings from Last 3 Encounters:  10/26/20 (!) 162.5 kg (358 lb 3.2 oz)  08/25/20 (!) 160.1 kg (353 lb 0.6 oz)  06/23/20 (!) 161.7 kg (356 lb 6.4 oz)    BP 130/80   Pulse 76   Wt (!) 162.5 kg (358 lb 3.2 oz)   SpO2 99%   BMI 49.96 kg/m  General:  NAD. No resp difficulty HEENT: Normal Neck: Supple. Thick neck, difficult to assess for JVD. Carotids 2+ bilat; no bruits. No lymphadenopathy or thryomegaly appreciated. Cor: PMI nondisplaced. Regular rate & rhythm. No rubs, gallops or murmurs. Lungs: Clear Abdomen: Obese, soft, nontender, nondistended. No hepatosplenomegaly. No bruits or masses. Good bowel sounds. Extremities: No cyanosis, clubbing, rash, 1+ LE edema L>R Neuro: alert & oriented x 3, cranial nerves grossly intact.  Moves all 4 extremities w/o difficulty. Affect pleasant.  Assessment/Plan: 1. Cardiomyopathy/chronic systolic CHF: Uncertain etiology but suspect related to uncontrolled hypertension vs diabetes vs a familial cardiomyopathy given extensive family history.  He had a Cardiolite in 2017 that did not show ischemia.  Echo 222 EF up to 45-50%.  NYHA class II symptoms, volume up slightly on exam, weight up 5 lbs likely from Father's Day cook out over the weekend. - Take lasix 40 mg daily x 2  days, then PRN. BMET today, repeat 7-10 days.  - Continue Coreg 25 mg bid.  - Continue spironolactone 25 mg daily. He has needed Veltassa in the past but is currently off.  - Continue hydralazine 100 mg bid (cannot remember to take tid) and Imdur 60 mg daily.  - Continue losartan 50 mg daily, he is unable to tolerate Entresto due to fatigue.  - Continue dapagliflozin 10 mg daily.   - EF is out of ICD range.  - Encouraged him to limit fluid, salt and ETOH intake. 2. HTN: BP controlled.  3. Type II diabetes: He is on [place].   4. Morbid obesity: Gradually losing weight.   - Continue diet and exercise efforts.     - Body mass index is 49.96 kg/m. 5. OSA: Continue CPAP.  6. CKD: Stage 3.  Check BMET today.  Sees Dr. Marval Regal soon.   Followup in 4 week with APP to assess volume and in 4 months with Dr. Wynema Birch [facility] 10/26/2020

## 2020-10-26 ENCOUNTER — Encounter (HOSPITAL_COMMUNITY): Payer: Self-pay

## 2020-10-26 ENCOUNTER — Ambulatory Visit (HOSPITAL_COMMUNITY)
Admission: RE | Admit: 2020-10-26 | Discharge: 2020-10-26 | Disposition: A | Discharge status: Home / Self Care | Payer: Medicaid Other | Source: Ambulatory Visit | Attending: Family Medicine | Admitting: Family Medicine

## 2020-10-26 ENCOUNTER — Other Ambulatory Visit: Payer: Self-pay

## 2020-10-26 VITALS — BP 130/80 | HR 76 | Wt 358.2 lb

## 2020-10-26 DIAGNOSIS — N183 Chronic kidney disease, stage 3 unspecified: Secondary | ICD-10-CM | POA: Insufficient documentation

## 2020-10-26 DIAGNOSIS — I10 Essential (primary) hypertension: Secondary | ICD-10-CM

## 2020-10-26 DIAGNOSIS — Z8249 Family history of ischemic heart disease and other diseases of the circulatory system: Secondary | ICD-10-CM | POA: Insufficient documentation

## 2020-10-26 DIAGNOSIS — G4733 Obstructive sleep apnea (adult) (pediatric): Secondary | ICD-10-CM | POA: Diagnosis not present

## 2020-10-26 DIAGNOSIS — Z6841 Body Mass Index (BMI) 40.0 and over, adult: Secondary | ICD-10-CM | POA: Insufficient documentation

## 2020-10-26 DIAGNOSIS — Z79899 Other long term (current) drug therapy: Secondary | ICD-10-CM | POA: Diagnosis not present

## 2020-10-26 DIAGNOSIS — Z794 Long term (current) use of insulin: Secondary | ICD-10-CM | POA: Diagnosis not present

## 2020-10-26 DIAGNOSIS — I130 Hypertensive heart and chronic kidney disease with heart failure and stage 1 through stage 4 chronic kidney disease, or unspecified chronic kidney disease: Secondary | ICD-10-CM | POA: Diagnosis not present

## 2020-10-26 DIAGNOSIS — E119 Type 2 diabetes mellitus without complications: Secondary | ICD-10-CM | POA: Diagnosis not present

## 2020-10-26 DIAGNOSIS — N1831 Chronic kidney disease, stage 3a: Secondary | ICD-10-CM

## 2020-10-26 DIAGNOSIS — I429 Cardiomyopathy, unspecified: Secondary | ICD-10-CM | POA: Diagnosis not present

## 2020-10-26 DIAGNOSIS — E1122 Type 2 diabetes mellitus with diabetic chronic kidney disease: Secondary | ICD-10-CM | POA: Diagnosis not present

## 2020-10-26 DIAGNOSIS — I5022 Chronic systolic (congestive) heart failure: Secondary | ICD-10-CM | POA: Diagnosis not present

## 2020-10-26 LAB — BASIC METABOLIC PANEL
Anion gap: 9 (ref 5–15)
BUN: 39 mg/dL — ABNORMAL HIGH (ref 6–20)
CO2: 21 mmol/L — ABNORMAL LOW (ref 22–32)
Calcium: 9.3 mg/dL (ref 8.9–10.3)
Chloride: 104 mmol/L (ref 98–111)
Creatinine, Ser: 2.23 mg/dL — ABNORMAL HIGH (ref 0.61–1.24)
GFR, Estimated: 34 mL/min — ABNORMAL LOW (ref >60)
Glucose, Bld: 235 mg/dL — ABNORMAL HIGH (ref 70–99)
Potassium: 4.2 mmol/L (ref 3.5–5.1)
Sodium: 134 mmol/L — ABNORMAL LOW (ref 135–145)

## 2020-10-26 NOTE — Patient Instructions (Addendum)
EKG done today.  Labs done today. We will contact you only if your labs are abnormal.  TODAY AND TOMORROW 10/27/20 PLEASE TAKE LASIX 40MG  BY MOUTH DAILY THEN DECREASE BACK DOWN TO AS NEEDED.   No other medication changes were made. Please continue all current medications as prescribed.  Your physician recommends that you schedule a follow-up appointment in: 7-10 days for a lab only appointment, 4 weeks for an appointment with our APP Clinic here in our office and in 4 months with Dr. Aundra Dubin. Please contact our office in August to schedule your October appointment.    If you have any questions or concerns before your next appointment please send [place] a message through [place] or call our office at [phone].    TO LEAVE A MESSAGE FOR THE NURSE SELECT OPTION 2, PLEASE LEAVE A MESSAGE INCLUDING: YOUR NAME DATE OF BIRTH CALL BACK NUMBER REASON FOR CALL**this is important as we prioritize the call backs  YOU WILL RECEIVE A CALL BACK THE SAME DAY AS LONG AS YOU CALL BEFORE 4:00 PM   Do the following things EVERYDAY: Weigh yourself in the morning before breakfast. Write it down and keep it in a log. Take your medicines as prescribed Eat low salt foods--Limit salt (sodium) to 2000 mg per day.  Stay as active as you can everyday Limit all fluids for the day to less than 2 liters   At the [facility], you and your health needs are our priority. As part of our continuing mission to provide you with exceptional heart care, we have created designated Provider Care Teams. These Care Teams include your primary Cardiologist (physician) and Advanced Practice Providers (APPs- Physician Assistants and Nurse Practitioners) who all work together to provide you with the care you need, when you need it.   You may see any of the following providers on your designated Care Team at your next follow up: Dr Glori Bickers Dr Haynes Kerns, NP Lyda Jester, [place] Audry Riles,  PharmD   Please be sure to bring in all your medications bottles to every appointment.

## 2020-11-05 ENCOUNTER — Other Ambulatory Visit (HOSPITAL_COMMUNITY): Payer: Medicaid Other

## 2020-11-23 NOTE — Progress Notes (Signed)
PCP: Dr. Glendon Axe Endo: Dr. Posey Pronto Nephrologist: Dr. Arty Baumgartner HF Cardiology: Dr. Aundra Dubin  55 y.o. yo with CKD stage 3, DM, HTN, and chronic systolic CHF was initially referred by Dr. Tamala Julian at [facility] for evaluation of CHF. Patient was diagnosed with a cardiomyopathy as far back as 2007.  His mother and father both have CHF and have ICDs.  Uncle had a heart transplant.  He has had long-standing HTN and diabetes.  We have record of a Cardiolite in 2017 with EF 25%, no ischemia and an echo in 2017 with EF 35-40%.  He says that with better blood pressure control, his EF returned to normal range on an echo at outside facility.  He was on Entresto in the past and felt like it helped his symptoms, but he has been off it for as his insurance is not covering it.   He was lost to followup in our clinic for about a year but was referred back by Dr. Harriet Masson.   Echo in 11/20 showed EF 35-40%, mild LV dilation, moderate LVH, moderately dilated RV with normal systolic function.   Echo in 7/21 showed EF 40-45%, mild LVH, RV normal. Echo was done today and reviewed, EF 45-50% with normal RV.   He returned 6/22 for HF follow up. Weight at home ~352 pounds. He is using CPAP. Working out at [employer] with his son, 3-4x/week. Volume was up slightly, instructed to increase lasix for 2 days then use PRN. Stable NYHA II symptoms.  Today he returns for HF follow up. Overall feeling fine. Denies increasing SOB, CP, dizziness, edema, or PND/Orthopnea. Working out with his son at the [facility] 3-4x/week. Appetite ok. No fever or chills. Weight at home 346 pounds. Taking all medications. Has not taken any PRN lasix for weight gain. Using CPAP nightly.  Labs (3/17): K 4.2, creatinine 1.57 Labs (5/20): LDL 118 Labs (4/21): K 5.5, creatinine 2.2 Labs (6/21): K 5, creatinine 2.19 Labs (10/21): K 5, creatinine 2.87 Labs (6/22): K 4.7, creatinine 2.54  PMH: 1. OSA: Uses CPAP.  2. Type 2 diabetes 3. Morbid obesity 4.  Hyperlipidemia 5. CKD stage 3: Sees Dr. Marval Regal.  6. Cardiomyopathy: Diagnosed in 2007. Presumed nonischemic, possibly related to long-standing HTN versus familial cardiomyopathy.  - Cardiolite in 2017: EF 25%, no ischemia.  - Echo (2017): EF 35-40%.  - Echo (11/20): EF 35-40%, mildly dilated LV, moderate LVH, moderate RV dilation with normal systolic function.  - Echo (7/21): EF 40-45%, mild LVH, RV normal.  - Echo (2/22): EF 45-50% with normal RV.  7. HTN  Social History   Socioeconomic History   Marital status: Divorced    Spouse name: Not on file   Number of children: Not on file   Years of education: Not on file   Highest education level: Not on file  Occupational History   Not on file  Tobacco Use   Smoking status: Never   Smokeless tobacco: Never  Vaping Use   Vaping Use: Never used  Substance and Sexual Activity   Alcohol use: Yes    Alcohol/week: 2.0 standard drinks    Types: 1 Cans of beer, 1 Shots of liquor per week    Comment: socially   Drug use: Yes    Types: Marijuana    Comment: once weekly   Sexual activity: Not on file  Other Topics Concern   Not on file  Social History Narrative   Not on file   Social Determinants of Health  Financial Resource Strain: Not on file  Food Insecurity: Not on file  Transportation Needs: Not on file  Physical Activity: Not on file  Stress: Not on file  Social Connections: Not on file  Intimate Partner Violence: Not on file   FH: Uncle with heart transplant, mother with CHF/ICD, father with CHF/ICD  ROS: All systems reviewed and negative except as per HPI.   Current Outpatient Medications  Medication Sig Dispense Refill   albuterol (PROVENTIL HFA;VENTOLIN HFA) 108 (90 BASE) MCG/ACT inhaler Inhale 2 puffs into the lungs every 4 (four) hours as needed for wheezing or shortness of breath.     amLODipine (NORVASC) 10 MG tablet Take 10 mg by mouth daily.     ANDROGEL PUMP 20.25 MG/ACT (1.62%) GEL Apply topically.      atorvastatin (LIPITOR) 10 MG tablet Take 10 mg by mouth daily.     carvedilol (COREG) 25 MG tablet Take 25 mg by mouth 2 (two) times daily with a meal.     cholecalciferol (VITAMIN D) 1000 UNITS tablet Take 3,000 Units by mouth every morning.      colchicine 0.6 MG tablet Take 0.6 mg by mouth as needed.      dapagliflozin propanediol (FARXIGA) 10 MG TABS tablet Take 1 tablet (10 mg total) by mouth daily before breakfast. 30 tablet 11   furosemide (LASIX) 40 MG tablet Take 1 tablet (40 mg total) by mouth as needed. 30 tablet 11   hydrALAZINE (APRESOLINE) 100 MG tablet TAKE 1 TABLET(100 MG) BY MOUTH TWICE DAILY 180 tablet 3   insulin regular human CONCENTRATED (HUMULIN R) 500 UNIT/ML kwikpen Use as directed. Max daily dose is 300 units daily.     isosorbide mononitrate (IMDUR) 30 MG 24 hr tablet Take 30 mg by mouth in the morning and at bedtime.     losartan (COZAAR) 50 MG tablet Take 1 tablet (50 mg total) by mouth daily. 30 tablet 11   Magnesium 250 MG TABS Take 1 tablet by mouth every morning.      spironolactone (ALDACTONE) 25 MG tablet Take 1 tablet (25 mg total) by mouth daily. 30 tablet 11   Testosterone 20.25 MG/1.25GM (1.62%) GEL Place 1 application onto the skin every morning.      No current facility-administered medications for this encounter.   Wt Readings from Last 3 Encounters:  11/25/20 (!) 158.8 kg (350 lb)  10/26/20 (!) 162.5 kg (358 lb 3.2 oz)  08/25/20 (!) 160.1 kg (353 lb 0.6 oz)   BP (!) 142/70   Pulse 73   Wt (!) 158.8 kg (350 lb)   SpO2 97%   BMI 48.82 kg/m  General:  NAD. No resp difficulty HEENT: Normal Neck: Supple. No JVD. Carotids 2+ bilat; no bruits. No lymphadenopathy or thryomegaly appreciated. Cor: PMI nondisplaced. Regular rate & rhythm. No rubs, gallops or murmurs. Lungs: Clear Abdomen: Obese, nontender, nondistended. No hepatosplenomegaly. No bruits or masses. Good bowel sounds. Extremities: No cyanosis, clubbing, rash, edema Neuro: Alert &  oriented x 3, cranial nerves grossly intact. Moves all 4 extremities w/o difficulty. Affect pleasant.  Assessment/Plan: 1. Cardiomyopathy/chronic systolic CHF: Uncertain etiology but suspect related to uncontrolled hypertension vs diabetes vs a familial cardiomyopathy given extensive family history.  He had a Cardiolite in 2017 that did not show ischemia.  Echo 2/22 EF up to 45-50%.  NYHA class II symptoms, he is not volume overloaded today. - Continue PRN lasix. BMET today. - Continue Coreg 25 mg bid.  - Continue spironolactone 25 mg daily.  He has needed Veltassa in the past but is currently off.  - Continue hydralazine 100 mg bid (cannot remember to take tid) and Imdur 60 mg daily.  - Continue losartan 50 mg daily, he is unable to tolerate Entresto due to fatigue.  - Continue dapagliflozin 10 mg daily.   - EF is out of ICD range.  - Encouraged him to limit fluid, salt and ETOH intake. 2. HTN: BP slightly elevated today. He has not taken his AM medications yet. 3. Type II diabetes: He is on [place].   4. Morbid obesity: Gradually losing weight.   - Continue diet and exercise efforts.     - Body mass index is 48.82 kg/m. 5. OSA: Continue CPAP.  6. CKD: Stage 3.  Check BMET today.  Sees Dr. Marval Regal.  Followup in 3 months with Dr. Wynema Birch [facility] FNP 11/25/2020

## 2020-11-24 ENCOUNTER — Other Ambulatory Visit (HOSPITAL_COMMUNITY): Payer: Self-pay | Admitting: Cardiology

## 2020-11-25 ENCOUNTER — Encounter (HOSPITAL_COMMUNITY): Payer: Self-pay

## 2020-11-25 ENCOUNTER — Ambulatory Visit (HOSPITAL_COMMUNITY)
Admission: RE | Admit: 2020-11-25 | Discharge: 2020-11-25 | Disposition: A | Discharge status: Home / Self Care | Payer: Medicaid Other | Source: Ambulatory Visit | Attending: Family Medicine | Admitting: Family Medicine

## 2020-11-25 ENCOUNTER — Other Ambulatory Visit: Payer: Self-pay

## 2020-11-25 VITALS — BP 142/70 | HR 73 | Wt 350.0 lb

## 2020-11-25 DIAGNOSIS — G4733 Obstructive sleep apnea (adult) (pediatric): Secondary | ICD-10-CM | POA: Diagnosis not present

## 2020-11-25 DIAGNOSIS — Z7182 Exercise counseling: Secondary | ICD-10-CM | POA: Insufficient documentation

## 2020-11-25 DIAGNOSIS — E1122 Type 2 diabetes mellitus with diabetic chronic kidney disease: Secondary | ICD-10-CM | POA: Diagnosis not present

## 2020-11-25 DIAGNOSIS — Z79899 Other long term (current) drug therapy: Secondary | ICD-10-CM | POA: Insufficient documentation

## 2020-11-25 DIAGNOSIS — E119 Type 2 diabetes mellitus without complications: Secondary | ICD-10-CM | POA: Diagnosis not present

## 2020-11-25 DIAGNOSIS — I130 Hypertensive heart and chronic kidney disease with heart failure and stage 1 through stage 4 chronic kidney disease, or unspecified chronic kidney disease: Secondary | ICD-10-CM | POA: Diagnosis present

## 2020-11-25 DIAGNOSIS — F1290 Cannabis use, unspecified, uncomplicated: Secondary | ICD-10-CM | POA: Diagnosis not present

## 2020-11-25 DIAGNOSIS — I10 Essential (primary) hypertension: Secondary | ICD-10-CM | POA: Diagnosis not present

## 2020-11-25 DIAGNOSIS — Z6841 Body Mass Index (BMI) 40.0 and over, adult: Secondary | ICD-10-CM | POA: Insufficient documentation

## 2020-11-25 DIAGNOSIS — Z794 Long term (current) use of insulin: Secondary | ICD-10-CM | POA: Insufficient documentation

## 2020-11-25 DIAGNOSIS — Z8249 Family history of ischemic heart disease and other diseases of the circulatory system: Secondary | ICD-10-CM | POA: Insufficient documentation

## 2020-11-25 DIAGNOSIS — N1831 Chronic kidney disease, stage 3a: Secondary | ICD-10-CM

## 2020-11-25 DIAGNOSIS — I5022 Chronic systolic (congestive) heart failure: Secondary | ICD-10-CM | POA: Diagnosis not present

## 2020-11-25 DIAGNOSIS — Z9989 Dependence on other enabling machines and devices: Secondary | ICD-10-CM

## 2020-11-25 DIAGNOSIS — N183 Chronic kidney disease, stage 3 unspecified: Secondary | ICD-10-CM | POA: Insufficient documentation

## 2020-11-25 LAB — BASIC METABOLIC PANEL
Anion gap: 10 (ref 5–15)
BUN: 42 mg/dL — ABNORMAL HIGH (ref 6–20)
CO2: 25 mmol/L (ref 22–32)
Calcium: 9.5 mg/dL (ref 8.9–10.3)
Chloride: 99 mmol/L (ref 98–111)
Creatinine, Ser: 2.62 mg/dL — ABNORMAL HIGH (ref 0.61–1.24)
GFR, Estimated: 28 mL/min — ABNORMAL LOW (ref >60)
Glucose, Bld: 280 mg/dL — ABNORMAL HIGH (ref 70–99)
Potassium: 4.3 mmol/L (ref 3.5–5.1)
Sodium: 134 mmol/L — ABNORMAL LOW (ref 135–145)

## 2020-11-25 NOTE — Patient Instructions (Signed)
It was great to see you today! No medication changes are needed at this time.  Labs today We will only contact you if something comes back abnormal or we need to make some changes. Otherwise no news is good news!  Your physician recommends that you schedule a follow-up appointment in: 3 months with Dr Aundra Dubin  Do the following things EVERYDAY: Weigh yourself in the morning before breakfast. Write it down and keep it in a log. Take your medicines as prescribed Eat low salt foods--Limit salt (sodium) to 2000 mg per day.  Stay as active as you can everyday Limit all fluids for the day to less than 2 liters  milAt the [facility], you and your health needs are our priority. As part of our continuing mission to provide you with exceptional heart care, we have created designated Provider Care Teams. These Care Teams include your primary Cardiologist (physician) and Advanced Practice Providers (APPs- Physician Assistants and Nurse Practitioners) who all work together to provide you with the care you need, when you need it.   You may see any of the following providers on your designated Care Team at your next follow up: Dr Glori Bickers Dr Loralie Champagne Dr Patrice Paradise, NP Lyda Jester, [place] Ginnie Smart Audry Riles, PharmD   Please be sure to bring in all your medications bottles to every appointment.   If you have any questions or concerns before your next appointment please send [place] a message through [place] or call our office at [phone].    TO LEAVE A MESSAGE FOR THE NURSE SELECT OPTION 2, PLEASE LEAVE A MESSAGE INCLUDING: YOUR NAME DATE OF BIRTH CALL BACK NUMBER REASON FOR CALL**this is important as we prioritize the call backs  YOU WILL RECEIVE A CALL BACK THE SAME DAY AS LONG AS YOU CALL BEFORE 4:00 PM

## 2020-12-21 NOTE — Progress Notes (Signed)
Subjective:    Patient ID: Dustin Mcclain, male    DOB: Dec 12, 1965, 55 y.o.   MRN: [id number]  HPI male never smoker followed for OSA/insomnia, complicated by obesity, HBP, cardiomyopathy, renal insufficiency, DM NPSG 08/15/16   AHI 10.8/ hr, desaturation to 83%, body weight 365 lbs   .-------------------------------------------------------------------------------------   12/19/19- 55 year old male never smoker followed for OSA/insomnia, complicated by obesity, HBP, Cardiomyopathy/CHF, renal insufficiency, DM, GERD, CKD3, CPAP auto 5-12/Adapt Download compliance 93%, AHI 1.6/ hr Body weight today - 372 lbs Had 2 Moderna Covax He feels much better off with CPAP. Doing very well.  Cardiac f/u tells him heart doing better on ECHO.  12/22/20- 55 year old male never smoker followed for OSA/insomnia, complicated by Obesity, HTN, Cardiomyopathy/CHF, renal insufficiency, DM2, GERD, CKD3, Anemia,  -albuterol hfa,  CPAP auto 5-12/Adapt Download- compliance 80%, AHI 1.1/ hr Body weight today-355 lbs Covid vax- 2 Phizer Very pleased with his CPAP.  Download reviewed. He credits CPAP for better cardiology reports. Sleeps well.   ROS-see HPI    + = positive Constitutional:   No-   weight loss, night sweats, fevers, chills, fatigue, lassitude. HEENT:   No-  headaches, difficulty swallowing, tooth/dental problems, sore throat,       No-  sneezing, itching, ear ache, nasal congestion, post nasal drip,  CV:  No-   chest pain, orthopnea, PND, swelling in lower extremities, anasarca,  dizziness, palpitations Resp:+shortness of breath with exertion or at rest.              No-   productive cough,  No non-productive cough,  No- coughing up of blood.              No-   change in color of mucus.  No- wheezing.   Skin: No-   rash or lesions. GI:  No-   heartburn, indigestion, abdominal pain, nausea, vomiting,  GU: . MS:  No-   joint pain or swelling.  Neuro-     nothing unusual Psych:  No- change in  mood or affect. No depression or anxiety.  No memory loss.  Objective:   Physical Exam General- Alert, Oriented, Affect-appropriate, Distress- none acute   +morbidly obese Skin- rash-none, lesions- none, excoriation- none Lymphadenopathy- none Head- atraumatic            Eyes- Gross vision intact, PERRLA, conjunctivae clear secretions            Ears- Hearing, canals normal            Nose- Clear, no- Septal dev, mucus, polyps, erosion, perforation             Throat- Mallampati III-IV , mucosa clear , drainage- none, tonsils- atrophic Neck- flexible , trachea midline, no stridor , thyroid nl, carotid no bruit Chest - symmetrical excursion , unlabored           Heart/CV- RRR , no murmur , no gallop  , no rub, nl s1 s2                           - JVD- none , edema- none, stasis changes- none, varices- none           Lung- clear to P&A, wheeze- none, cough- none , dullness-none, rub- none           Chest wall-  Abd-  Br/ Gen/ Rectal- Not done, not indicated Extrem- cyanosis- none, clubbing, none, atrophy- none, strength- nl Neuro- grossly intact  to observation  Assessment & Plan:

## 2020-12-22 ENCOUNTER — Encounter: Payer: Self-pay | Admitting: Internal Medicine

## 2020-12-22 ENCOUNTER — Other Ambulatory Visit: Payer: Self-pay

## 2020-12-22 ENCOUNTER — Ambulatory Visit (INDEPENDENT_AMBULATORY_CARE_PROVIDER_SITE_OTHER): Payer: Medicaid Other | Admitting: Internal Medicine

## 2020-12-22 DIAGNOSIS — G4733 Obstructive sleep apnea (adult) (pediatric): Secondary | ICD-10-CM

## 2020-12-22 NOTE — Assessment & Plan Note (Signed)
Benefits with good compliance and control. Plan- continue auto 5-12

## 2020-12-22 NOTE — Patient Instructions (Signed)
We can continue CPAP auto 5-12   Please call if we can help 

## 2020-12-22 NOTE — Assessment & Plan Note (Signed)
He is pleased with weight loss and encouraged to keep working at it.

## 2021-03-02 ENCOUNTER — Other Ambulatory Visit: Payer: Self-pay

## 2021-03-02 ENCOUNTER — Ambulatory Visit (HOSPITAL_COMMUNITY)
Admission: RE | Admit: 2021-03-02 | Discharge: 2021-03-02 | Disposition: A | Discharge status: Home / Self Care | Payer: Medicaid Other | Source: Ambulatory Visit | Attending: Cardiology | Admitting: Cardiology

## 2021-03-02 ENCOUNTER — Encounter (HOSPITAL_COMMUNITY): Payer: Self-pay | Admitting: Cardiology

## 2021-03-02 VITALS — BP 150/80 | HR 79 | Wt 354.2 lb

## 2021-03-02 DIAGNOSIS — I5022 Chronic systolic (congestive) heart failure: Secondary | ICD-10-CM | POA: Diagnosis not present

## 2021-03-02 DIAGNOSIS — Z794 Long term (current) use of insulin: Secondary | ICD-10-CM | POA: Diagnosis not present

## 2021-03-02 DIAGNOSIS — Z79890 Hormone replacement therapy (postmenopausal): Secondary | ICD-10-CM | POA: Diagnosis not present

## 2021-03-02 DIAGNOSIS — I130 Hypertensive heart and chronic kidney disease with heart failure and stage 1 through stage 4 chronic kidney disease, or unspecified chronic kidney disease: Secondary | ICD-10-CM | POA: Diagnosis not present

## 2021-03-02 DIAGNOSIS — N183 Chronic kidney disease, stage 3 unspecified: Secondary | ICD-10-CM | POA: Insufficient documentation

## 2021-03-02 DIAGNOSIS — E1122 Type 2 diabetes mellitus with diabetic chronic kidney disease: Secondary | ICD-10-CM | POA: Diagnosis not present

## 2021-03-02 DIAGNOSIS — G4733 Obstructive sleep apnea (adult) (pediatric): Secondary | ICD-10-CM | POA: Insufficient documentation

## 2021-03-02 DIAGNOSIS — Z8249 Family history of ischemic heart disease and other diseases of the circulatory system: Secondary | ICD-10-CM | POA: Diagnosis not present

## 2021-03-02 DIAGNOSIS — Z7984 Long term (current) use of oral hypoglycemic drugs: Secondary | ICD-10-CM | POA: Diagnosis not present

## 2021-03-02 DIAGNOSIS — Z6841 Body Mass Index (BMI) 40.0 and over, adult: Secondary | ICD-10-CM | POA: Insufficient documentation

## 2021-03-02 DIAGNOSIS — Z79899 Other long term (current) drug therapy: Secondary | ICD-10-CM | POA: Insufficient documentation

## 2021-03-02 LAB — BASIC METABOLIC PANEL
Anion gap: 9 (ref 5–15)
BUN: 41 mg/dL — ABNORMAL HIGH (ref 6–20)
CO2: 26 mmol/L (ref 22–32)
Calcium: 9.1 mg/dL (ref 8.9–10.3)
Chloride: 101 mmol/L (ref 98–111)
Creatinine, Ser: 2.44 mg/dL — ABNORMAL HIGH (ref 0.61–1.24)
GFR, Estimated: 31 mL/min — ABNORMAL LOW (ref >60)
Glucose, Bld: 279 mg/dL — ABNORMAL HIGH (ref 70–99)
Potassium: 4.8 mmol/L (ref 3.5–5.1)
Sodium: 136 mmol/L (ref 135–145)

## 2021-03-02 MED ORDER — CARVEDILOL 25 MG PO TABS: 37.5000 mg | ORAL_TABLET | Freq: Two times a day (BID) | ORAL | 3 refills | Status: DC

## 2021-03-02 NOTE — Patient Instructions (Addendum)
Labs done today. We will contact you only if your labs are abnormal.  INCREASE Carvedilol to 37.5mg  (1 & 1/2 talets) by mouth 2 times daily.   No other medication changes were made. Please continue all current medications as prescribed.  You have been referred to the Pharmacy Clinic. They will contact you to schedule an appointment.   Your physician recommends that you schedule a follow-up appointment in: 4 months with our NP/PA Clinic here in our office   If you have any questions or concerns before your next appointment please send [place] a message through [place] or call our office at [phone].    TO LEAVE A MESSAGE FOR THE NURSE SELECT OPTION 2, PLEASE LEAVE A MESSAGE INCLUDING: YOUR NAME DATE OF BIRTH CALL BACK NUMBER REASON FOR CALL**this is important as we prioritize the call backs  YOU WILL RECEIVE A CALL BACK THE SAME DAY AS LONG AS YOU CALL BEFORE 4:00 PM   Do the following things EVERYDAY: Weigh yourself in the morning before breakfast. Write it down and keep it in a log. Take your medicines as prescribed Eat low salt foods--Limit salt (sodium) to 2000 mg per day.  Stay as active as you can everyday Limit all fluids for the day to less than 2 liters   At the [facility], you and your health needs are our priority. As part of our continuing mission to provide you with exceptional heart care, we have created designated Provider Care Teams. These Care Teams include your primary Cardiologist (physician) and Advanced Practice Providers (APPs- Physician Assistants and Nurse Practitioners) who all work together to provide you with the care you need, when you need it.   You may see any of the following providers on your designated Care Team at your next follow up: Dr Glori Bickers Dr Haynes Kerns, NP Lyda Jester, [place] Audry Riles, PharmD   Please be sure to bring in all your medications bottles to every appointment.

## 2021-03-02 NOTE — Progress Notes (Signed)
PCP: Dr. Glendon Axe HF Cardiology: Dr. Aundra Dubin  55 y.o. yo with CKD stage 3, DM, HTN, and chronic systolic CHF was initially referred by Dr. Tamala Julian at [facility] for evaluation of CHF. Patient was diagnosed with a cardiomyopathy as far back as 2007.  His mother and father both have CHF and have ICDs.  Uncle had a heart transplant.  He has had long-standing HTN and diabetes.  We have record of a Cardiolite in 2017 with EF 25%, no ischemia and an echo in 2017 with EF 35-40%.  He says that with better blood pressure control, his EF returned to normal range on an echo at outside facility.  He was on Entresto in the past and felt like it helped his symptoms, but he has been off it for as his insurance is not covering it.   He was lost to followup in our clinic for about a year but was referred back by Dr. Harriet Masson.   Echo in 11/20 showed EF 35-40%, mild LV dilation, moderate LVH, moderately dilated RV with normal systolic function.   Echo in 7/21 showed EF 40-45%, mild LVH, RV normal. Echo in 2/22 showed EF 45-50% with normal RV.   He returns for followup of CHF.  He was unable to tolerate low dose Entresto.  He continues to use CPAP regularly.  Weight is up 4 lbs.  He is short of breath walking up stairs but does not get short of breath walking on flat ground.  No chest pain.  No orthopnea/PND.  He goes to the gym regularly.  BP is elevated today.     Labs (3/17): K 4.2, creatinine 1.57 Labs (5/20): LDL 118 Labs (4/21): K 5.5, creatinine 2.2 Labs (6/21): K 5, creatinine 2.19 Labs (10/21): K 5, creatinine 2.87 Labs (7/22): K 4.3, creatinine 2.62  PMH: 1. OSA: Uses CPAP.  2. Type 2 diabetes 3. Morbid obesity 4. Hyperlipidemia 5. CKD stage 3: Sees Dr. Marval Regal.  6. Cardiomyopathy: Diagnosed in 2007. Presumed nonischemic, possibly related to long-standing HTN versus familial cardiomyopathy.  - Cardiolite in 2017: EF 25%, no ischemia.  - Echo (2017): EF 35-40%.  - Echo (11/20): EF 35-40%,  mildly dilated LV, moderate LVH, moderate RV dilation with normal systolic function.  - Echo (7/21): EF 40-45%, mild LVH, RV normal.  - Echo (2/22): EF 45-50% with normal RV.  7. HTN  Social History   Socioeconomic History   Marital status: Divorced    Spouse name: Not on file   Number of children: Not on file   Years of education: Not on file   Highest education level: Not on file  Occupational History   Not on file  Tobacco Use   Smoking status: Never   Smokeless tobacco: Never  Vaping Use   Vaping Use: Never used  Substance and Sexual Activity   Alcohol use: Yes    Alcohol/week: 2.0 standard drinks    Types: 1 Cans of beer, 1 Shots of liquor per week    Comment: socially   Drug use: Yes    Types: Marijuana    Comment: once weekly   Sexual activity: Not on file  Other Topics Concern   Not on file  Social History Narrative   Not on file   Social Determinants of Health   Financial Resource Strain: Not on file  Food Insecurity: Not on file  Transportation Needs: Not on file  Physical Activity: Not on file  Stress: Not on file  Social Connections: Not  on file  Intimate Partner Violence: Not on file   FH: Uncle with heart transplant, mother with CHF/ICD, father with CHF/ICD  ROS: All systems reviewed and negative except as per HPI.   Current Outpatient Medications  Medication Sig Dispense Refill   albuterol (PROVENTIL HFA;VENTOLIN HFA) 108 (90 BASE) MCG/ACT inhaler Inhale 2 puffs into the lungs every 4 (four) hours as needed for wheezing or shortness of breath.     amLODipine (NORVASC) 10 MG tablet Take 10 mg by mouth daily.     atorvastatin (LIPITOR) 10 MG tablet Take 10 mg by mouth daily.     cholecalciferol (VITAMIN D) 1000 UNITS tablet Take 3,000 Units by mouth every morning.      colchicine 0.6 MG tablet Take 0.6 mg by mouth as needed.      dapagliflozin propanediol (FARXIGA) 10 MG TABS tablet Take 1 tablet (10 mg total) by mouth daily before breakfast. 30  tablet 11   furosemide (LASIX) 40 MG tablet Take 1 tablet (40 mg total) by mouth as needed. 30 tablet 11   hydrALAZINE (APRESOLINE) 100 MG tablet TAKE 1 TABLET(100 MG) BY MOUTH TWICE DAILY 180 tablet 3   insulin regular human CONCENTRATED (HUMULIN R) 500 UNIT/ML kwikpen Use as directed. Max daily dose is 300 units daily.     isosorbide mononitrate (IMDUR) 30 MG 24 hr tablet Take 30 mg by mouth in the morning and at bedtime.     losartan (COZAAR) 50 MG tablet Take 1 tablet (50 mg total) by mouth daily. 30 tablet 11   Magnesium 250 MG TABS Take 1 tablet by mouth every morning.      spironolactone (ALDACTONE) 25 MG tablet TAKE 1 TABLET(25 MG) BY MOUTH DAILY 30 tablet 11   Testosterone 20.25 MG/1.25GM (1.62%) GEL Place 1 application onto the skin every morning.      carvedilol (COREG) 25 MG tablet Take 1.5 tablets (37.5 mg total) by mouth 2 (two) times daily with a meal. 270 tablet 3   No current facility-administered medications for this encounter.   BP (!) 150/80   Pulse 79   Wt (!) 160.7 kg (354 lb 3.2 oz)   SpO2 99%   BMI 49.40 kg/m  General: NAD, obese Neck: Thick. No JVD, no thyromegaly or thyroid nodule.  Lungs: Clear to auscultation bilaterally with normal respiratory effort. CV: Nondisplaced PMI.  Heart regular S1/S2, no S3/S4, no murmur.  1+ ankle edema.  No carotid bruit.  Normal pedal pulses.  Abdomen: Soft, nontender, no hepatosplenomegaly, no distention.  Skin: Intact without lesions or rashes.  Neurologic: Alert and oriented x 3.  Psych: Normal affect. Extremities: No clubbing or cyanosis.  HEENT: Normal.  Assessment/Plan: 1. Cardiomyopathy/chronic systolic CHF: Uncertain etiology but suspect related to uncontrolled hypertension vs diabetes vs a familial cardiomyopathy given extensive family history.  He had a Cardiolite in 2017 that did not show ischemia.  Echo in 2/22 with EF up to 45-50%.  NYHA class II symptoms, not volume overloaded on exam.  - With elevated BP,  increase Coreg to 37.5 mg bid.   - Continue spironolactone 25 mg daily. He has needed Veltassa in the past but is currently off.  BMET today.  - Continue hydralazine 100 mg bid (cannot remember to take tid) and Imdur 60 mg daily.  - Continue losartan 50 mg daily, he is unable to tolerate Entresto due to fatigue.  Would not increase with elevated creatinine.  - Continue dapagliflozin 10 mg daily.   - EF is out  of ICD range.  2. HTN: BP elevated, increasing Coreg as above.  3. Type II diabetes: He is on [place].   4. Morbid obesity: Weight higher today.  - I will refer him to pharmacy clinic for semaglutide for weight loss.      5. OSA: Continue CPAP.  6. CKD: Stage 3.  Check BMET today.  Sees Dr. Marval Regal.   Followup 4 months with NP/PA.   Loralie Champagne 03/02/2021

## 2021-03-04 ENCOUNTER — Other Ambulatory Visit: Payer: Self-pay

## 2021-03-04 ENCOUNTER — Ambulatory Visit (INDEPENDENT_AMBULATORY_CARE_PROVIDER_SITE_OTHER): Payer: Medicaid Other | Admitting: Pharmacist

## 2021-03-04 VITALS — BP 160/88 | HR 83 | Ht 71.0 in

## 2021-03-04 DIAGNOSIS — E119 Type 2 diabetes mellitus without complications: Secondary | ICD-10-CM

## 2021-03-04 MED ORDER — OZEMPIC (0.25 OR 0.5 MG/DOSE) 2 MG/1.5ML ~~LOC~~ SOPN: PEN_INJECTOR | SUBCUTANEOUS | 2 refills | Status: DC

## 2021-03-04 NOTE — Patient Instructions (Addendum)
It was nice meeting you today!  You can discontinue your Victoza at this time  Take your first dose of Ozempic the day after your Victoza  Your dose will be 0.5mg  once a week.  If after 4 weeks you feel well and want to increase to 1.0 just call me and let me know.  If you want to stay at 0.5mg  that is fine too.  We will recheck your A1c in December  Please call with any questions!  Karren Cobble, PharmD, BCACP, [place], [place], [place], [place], [address] Phone: [phone], Fax: [phone]

## 2021-03-04 NOTE — Progress Notes (Signed)
Patient ID: Dustin Mcclain                 DOB: November 03, 1965                    MRN: [id number]     HPI: Dustin Mcclain is a 55 y.o. male patient referred to pharmacy clinic by Dr Aundra Dubin to initiate weight loss therapy with GLP1-RA. PMH is significant for obesity complicated by chronic medical conditions including T2DM, CHF, CKD, and HTN. Most recent BMI 49.4.  Most recent A1c 9.7 on 01/14/21.  Patient presents in good spirits. Reports is motivated to get blood sugar and weight under control.  Discouraged by his DM treatment. Reports every visit he had, regardless of his blood sugar, his insulin was increased. Most recent dosage was 300 units of U-500 insulin. Realized his weight loss goals were being negatively impacted by his insulin dosage so he quit injecting insulin about 3 months ago.    Not on our med list but patient reports he has been on Victoza 1.8mg  daily along with his [place].  Has been trying diet changes and exercise and has been through DM education.  Working on increasing vegetables, healthy fats, proteins, and whole grains.  Has discontinued sodas and juices.  Drinks beer socially.  Drinks mostly water now.   Current weight management medications: n/a  Current meds that may affect weight:  Victoza 1.8mg  daily  Baseline weight/BMI:  49.4  Insurance payor:   [employer]  Diet:  -Breakfast: tuna -Lunch: tuna sandwich, peanut butter sandwich, water -Dinner: baked chicken, broccoli brown rice -Snacks: apple and peanut butter, nuts, ginger snaps -Drinks: water  Social History:  beer  Labs: Lab Results  Component Value Date   HGBA1C (H) 12/11/2006    6.3 (NOTE)   The ADA recommends the following therapeutic goals for glycemic   control related to Hgb A1C measurement:   Goal of Therapy:   < 7.0% Hgb A1C   Action Suggested:  > 8.0% Hgb A1C   Ref:  Diabetes Care, 22, Suppl. 1, 1999    Wt Readings from Last 1 Encounters:  03/02/21 (!) 354 lb 3.2 oz  (160.7 kg)    BP Readings from Last 1 Encounters:  03/02/21 (!) 150/80   Pulse Readings from Last 1 Encounters:  03/02/21 79       Component Value Date/Time   CHOL 191 09/27/2018 1010   TRIG 193 (H) 09/27/2018 1010   HDL 34 (L) 09/27/2018 1010   CHOLHDL 5.6 09/27/2018 1010   VLDL 39 09/27/2018 1010   LDLCALC 118 (H) 09/27/2018 1010    Past Medical History:  Diagnosis Date   Allergic rhinitis    Allergic rhinitis, cause unspecified    Anemia in stage 3 chronic kidney disease ([place])    sees dr Arty Baumgartner every 3 months   Anxiety state, unspecified    Asthma    mild   Benign neoplasm of descending colon 04/15/2014   Cardiomyopathy ([place]) 07/11/2008   Qualifier: Diagnosis of  By: Annamaria Boots MD, Clinton D    CHF 07/27/2008   Qualifier: Diagnosis of  By: Annamaria Boots MD, Clinton D    CHF (congestive heart failure) ([place])    right side   Chronic systolic congestive heart failure ([place]) 10/22/2015   Depressed left ventricular systolic function 57/12/4694   Depressive disorder, not elsewhere classified    Diabetes mellitus without complication ([place])    Dilated cardiomyopathy ([place]) 07/11/2008   Qualifier: Diagnosis  of  By: Annamaria Boots MD, Sherin Quarry of this note might be different from the original. EF 27% on nuclear scan showing no evidence of ischemia 5/17   DM 10/23/2009   Qualifier: Diagnosis of  By: Annamaria Boots MD, Clinton D    Essential (primary) hypertension    Essential hypertension 06/06/2008   Qualifier: Diagnosis of  By: Lockie Pares CMA, [name]     GERD (gastroesophageal reflux disease)    Insomnia 06/06/2008   Qualifier: Diagnosis of  By: Lockie Pares CMA, [name]     Insomnia, unspecified    Joint effusion 08/06/2013   Mixed hyperlipidemia 10/03/2017   Morbid obesity ([place])    Morbid obesity with BMI of 50.0-59.9, adult ([place]) 04/12/2019   Noncompliance with medication regimen 10/22/2015   Obstructive sleep apnea 06/06/2008   NPSG 08/15/16   AHI 10.8/ hr, desaturation to 83%, body weight 365 lbs-  requalified for CPAP    Other primary cardiomyopathies    Primary cardiomyopathy ([place])    Right knee pain 08/06/2013   Seasonal allergic rhinitis 07/27/2008   Symptom patterns suggests primary sensitivity to spring grass pollens. Environmental precautions discussed. Potential candidate for sublingual therapy.     Shortness of breath 10/19/2015   Sleep apnea    cpap setting of 22 or 23   Tendinitis    left wrist and elbow and left knee   Unspecified disorder resulting from impaired renal function    Unspecified essential hypertension    Unspecified sleep apnea    cpap setting of 22 or 23    Current Outpatient Medications on File Prior to Visit  Medication Sig Dispense Refill   albuterol (PROVENTIL HFA;VENTOLIN HFA) 108 (90 BASE) MCG/ACT inhaler Inhale 2 puffs into the lungs every 4 (four) hours as needed for wheezing or shortness of breath.     amLODipine (NORVASC) 10 MG tablet Take 10 mg by mouth daily.     atorvastatin (LIPITOR) 10 MG tablet Take 10 mg by mouth daily.     carvedilol (COREG) 25 MG tablet Take 1.5 tablets (37.5 mg total) by mouth 2 (two) times daily with a meal. 270 tablet 3   cholecalciferol (VITAMIN D) 1000 UNITS tablet Take 3,000 Units by mouth every morning.      colchicine 0.6 MG tablet Take 0.6 mg by mouth as needed.      dapagliflozin propanediol (FARXIGA) 10 MG TABS tablet Take 1 tablet (10 mg total) by mouth daily before breakfast. 30 tablet 11   furosemide (LASIX) 40 MG tablet Take 1 tablet (40 mg total) by mouth as needed. 30 tablet 11   hydrALAZINE (APRESOLINE) 100 MG tablet TAKE 1 TABLET(100 MG) BY MOUTH TWICE DAILY 180 tablet 3   insulin regular human CONCENTRATED (HUMULIN R) 500 UNIT/ML kwikpen Use as directed. Max daily dose is 300 units daily.     isosorbide mononitrate (IMDUR) 30 MG 24 hr tablet Take 30 mg by mouth in the morning and at bedtime.     losartan (COZAAR) 50 MG tablet Take 1 tablet (50 mg total) by mouth daily. 30 tablet 11   Magnesium 250 MG  TABS Take 1 tablet by mouth every morning.      spironolactone (ALDACTONE) 25 MG tablet TAKE 1 TABLET(25 MG) BY MOUTH DAILY 30 tablet 11   Testosterone 20.25 MG/1.25GM (1.62%) GEL Place 1 application onto the skin every morning.      No current facility-administered medications on file prior to visit.    No Known Allergies   Assessment/Plan:  1. Weight loss - Discussed pathophysiology of weight gain and T2DM with patient.  Discussed role of insulin and glucose and importance of exercise.  Patient self discontinued his insulin and reports his blood sugar has not increased and would like to be switched to Ozempic.  Using demo pen, educated patient on priming pen and dosing.  Will start at 0.5mg  weekly since patient is currently on a GLP1a.  Advised after 4 weeks for patient to contact clinic to discuss increasing to 1mg  or staying on 0.5mg  dose.  Patient voiced understanding.  Advised patient on common side effects including nausea, diarrhea, dyspepsia, decreased appetite, and fatigue. Counseled patient on reducing meal size and how to titrate medication to minimize side effects. Counseled patient to call if intolerable side effects or if experiencing dehydration, abdominal pain, or dizziness. Patient will adhere to dietary modifications and will target at least 150 minutes of moderate intensity exercise weekly.   Follow up in 4 weeks.   Stop Victoza 1.8mg  Start Ozempic 0.5mg  once weekly  Karren Cobble, PharmD, BCACP, [place], [place], [place], [place], [address] Phone: [phone], Fax: [phone]

## 2021-04-07 ENCOUNTER — Telehealth: Payer: Self-pay | Admitting: Pharmacist

## 2021-04-07 DIAGNOSIS — E119 Type 2 diabetes mellitus without complications: Secondary | ICD-10-CM

## 2021-04-07 DIAGNOSIS — Z6841 Body Mass Index (BMI) 40.0 and over, adult: Secondary | ICD-10-CM

## 2021-04-07 MED ORDER — OZEMPIC (1 MG/DOSE) 4 MG/3ML ~~LOC~~ SOPN
1.0000 mg | PEN_INJECTOR | SUBCUTANEOUS | 2 refills | Status: DC
Start: 2021-04-07 — End: 2021-04-12

## 2021-04-07 NOTE — Telephone Encounter (Signed)
Patient called, believes he is losing weight and is ready to increase to Ozempic 1.0mg .

## 2021-04-08 ENCOUNTER — Ambulatory Visit (INDEPENDENT_AMBULATORY_CARE_PROVIDER_SITE_OTHER): Payer: Medicaid Other | Admitting: Cardiology

## 2021-04-08 ENCOUNTER — Encounter: Payer: Self-pay | Admitting: Cardiology

## 2021-04-08 ENCOUNTER — Other Ambulatory Visit: Payer: Self-pay

## 2021-04-08 VITALS — BP 124/86 | HR 76 | Ht 71.0 in | Wt 355.0 lb

## 2021-04-08 DIAGNOSIS — I10 Essential (primary) hypertension: Secondary | ICD-10-CM

## 2021-04-08 DIAGNOSIS — G4739 Other sleep apnea: Secondary | ICD-10-CM | POA: Diagnosis not present

## 2021-04-08 DIAGNOSIS — N1831 Chronic kidney disease, stage 3a: Secondary | ICD-10-CM | POA: Insufficient documentation

## 2021-04-08 DIAGNOSIS — I420 Dilated cardiomyopathy: Secondary | ICD-10-CM | POA: Diagnosis not present

## 2021-04-08 DIAGNOSIS — Z794 Long term (current) use of insulin: Secondary | ICD-10-CM

## 2021-04-08 DIAGNOSIS — E782 Mixed hyperlipidemia: Secondary | ICD-10-CM

## 2021-04-08 DIAGNOSIS — I428 Other cardiomyopathies: Secondary | ICD-10-CM | POA: Diagnosis not present

## 2021-04-08 DIAGNOSIS — E118 Type 2 diabetes mellitus with unspecified complications: Secondary | ICD-10-CM | POA: Insufficient documentation

## 2021-04-08 NOTE — Patient Instructions (Signed)

## 2021-04-08 NOTE — Progress Notes (Signed)
Cardiology Office Note:    Date:  04/08/2021   ID:  Dustin Mcclain, DOB [birth year], MRN [id number]  PCP:  Glendon Axe, MD  Cardiologist:  Berniece Salines, DO  Electrophysiologist:  None   Referring MD: Glendon Axe, MD   Chief Complaint  Patient presents with   Follow-up    6 months.   History of Present Illness:    Dustin Mcclain is a 55 y.o. male with a hx of history of hypertension, CKD stage III, hyperlipidemia, systolic heart failure, dilated cardiomyopathy EF recently improved EF to 45 to 50%.   I saw the patient in August 2021 at that time his blood pressure was slightly elevated but he had told me that his blood pressure at home was running in the 130s to 140s.  Therefore I held off on increasing his antihypertensive medications.  He did send me some blood pressure readings in the interim which showed that his systolics were in the 174.   The patient in October 2021 at that time he was scheduled for his echocardiogram in February and follow-up with our heart failure clinic.  He was doing well no changes were made to his medication regimen.  He was seen in 08/25/2020 at that time he was doing well from a CV standpoint.   Since I saw the patient he has been doing well. In the interim he followed with the heart failure clinic. He has also follow with the pharmacy team to help with diabetic education and medical weight loss therapy.   He offers no complaints today. He is really optimistic about weight loss and plan to do the right thing for his health.    Past Medical History:  Diagnosis Date   Allergic rhinitis    Allergic rhinitis, cause unspecified    Anemia in stage 3 chronic kidney disease ([place])    sees dr Arty Baumgartner every 3 months   Anxiety state, unspecified    Asthma    mild   Benign neoplasm of descending colon 04/15/2014   Cardiomyopathy ([place]) 07/11/2008   Qualifier: Diagnosis of  By: Annamaria Boots MD, Clinton D    CHF 07/27/2008   Qualifier: Diagnosis of  By: Annamaria Boots MD,  Clinton D    CHF (congestive heart failure) ([place])    right side   Chronic systolic congestive heart failure ([place]) 10/22/2015   Depressed left ventricular systolic function 94/08/9673   Depressive disorder, not elsewhere classified    Diabetes mellitus without complication ([place])    Dilated cardiomyopathy ([place]) 07/11/2008   Qualifier: Diagnosis of  By: Annamaria Boots MD, Kasandra Knudsen   Formatting of this note might be different from the original. EF 27% on nuclear scan showing no evidence of ischemia 5/17   DM 10/23/2009   Qualifier: Diagnosis of  By: Annamaria Boots MD, Clinton D    Essential (primary) hypertension    Essential hypertension 06/06/2008   Qualifier: Diagnosis of  By: Lockie Pares CMA, [name]     GERD (gastroesophageal reflux disease)    Insomnia 06/06/2008   Qualifier: Diagnosis of  By: Lockie Pares CMA, [name]     Insomnia, unspecified    Joint effusion 08/06/2013   Mixed hyperlipidemia 10/03/2017   Morbid obesity ([place])    Morbid obesity with BMI of 50.0-59.9, adult ([place]) 04/12/2019   Noncompliance with medication regimen 10/22/2015   Obstructive sleep apnea 06/06/2008   NPSG 08/15/16   AHI 10.8/ hr, desaturation to 83%, body weight 365 lbs- requalified for CPAP    Other primary cardiomyopathies  Primary cardiomyopathy ([place])    Right knee pain 08/06/2013   Seasonal allergic rhinitis 07/27/2008   Symptom patterns suggests primary sensitivity to spring grass pollens. Environmental precautions discussed. Potential candidate for sublingual therapy.     Shortness of breath 10/19/2015   Sleep apnea    cpap setting of 22 or 23   Tendinitis    left wrist and elbow and left knee   Unspecified disorder resulting from impaired renal function    Unspecified essential hypertension    Unspecified sleep apnea    cpap setting of 22 or 23    Past Surgical History:  Procedure Laterality Date   APPENDECTOMY     COLONOSCOPY WITH PROPOFOL N/A 04/15/2014   Procedure: COLONOSCOPY WITH PROPOFOL;  Surgeon: Irene Shipper, MD;   Location: [facility];  Service: Endoscopy;  Laterality: N/A;   PATELLAR TENDON REPAIR     left    Current Medications: Current Meds  Medication Sig   albuterol (PROVENTIL HFA;VENTOLIN HFA) 108 (90 BASE) MCG/ACT inhaler Inhale 2 puffs into the lungs every 4 (four) hours as needed for wheezing or shortness of breath.   amLODipine (NORVASC) 10 MG tablet Take 10 mg by mouth daily.   atorvastatin (LIPITOR) 10 MG tablet Take 10 mg by mouth daily.   carvedilol (COREG) 25 MG tablet Take 1.5 tablets (37.5 mg total) by mouth 2 (two) times daily with a meal.   cholecalciferol (VITAMIN D) 1000 UNITS tablet Take 3,000 Units by mouth every morning.    colchicine 0.6 MG tablet Take 0.6 mg by mouth as needed.    dapagliflozin propanediol (FARXIGA) 10 MG TABS tablet Take 1 tablet (10 mg total) by mouth daily before breakfast.   furosemide (LASIX) 40 MG tablet Take 1 tablet (40 mg total) by mouth as needed.   hydrALAZINE (APRESOLINE) 100 MG tablet TAKE 1 TABLET(100 MG) BY MOUTH TWICE DAILY   insulin regular human CONCENTRATED (HUMULIN R) 500 UNIT/ML kwikpen Use as directed. Max daily dose is 300 units daily.   isosorbide mononitrate (IMDUR) 30 MG 24 hr tablet Take 30 mg by mouth in the morning and at bedtime.   losartan (COZAAR) 50 MG tablet Take 1 tablet (50 mg total) by mouth daily.   Magnesium 250 MG TABS Take 1 tablet by mouth every morning.    Semaglutide, 1 MG/DOSE, (OZEMPIC, 1 MG/DOSE,) 4 MG/3ML SOPN Inject 1 mg into the skin once a week.   spironolactone (ALDACTONE) 25 MG tablet TAKE 1 TABLET(25 MG) BY MOUTH DAILY   Testosterone 20.25 MG/1.25GM (1.62%) GEL Place 1 application onto the skin every morning.      Allergies:   Patient has no known allergies.   Social History   Socioeconomic History   Marital status: Divorced    Spouse name: Not on file   Number of children: Not on file   Years of education: Not on file   Highest education level: Not on file  Occupational History   Not on file   Tobacco Use   Smoking status: Never   Smokeless tobacco: Never  Vaping Use   Vaping Use: Never used  Substance and Sexual Activity   Alcohol use: Yes    Alcohol/week: 2.0 standard drinks    Types: 1 Cans of beer, 1 Shots of liquor per week    Comment: socially   Drug use: Yes    Types: Marijuana    Comment: once weekly   Sexual activity: Not on file  Other Topics Concern   Not on  file  Social History Narrative   Not on file   Social Determinants of Health   Financial Resource Strain: Not on file  Food Insecurity: Not on file  Transportation Needs: Not on file  Physical Activity: Not on file  Stress: Not on file  Social Connections: Not on file     Family History: The patient's family history includes Diabetes in his father and mother; Heart disease in his father and mother.  ROS:   Review of Systems  Constitution: Negative for decreased appetite, fever and weight gain.  HENT: Negative for congestion, ear discharge, hoarse voice and sore throat.   Eyes: Negative for discharge, redness, vision loss in right eye and visual halos.  Cardiovascular: Negative for chest pain, dyspnea on exertion, leg swelling, orthopnea and palpitations.  Respiratory: Negative for cough, hemoptysis, shortness of breath and snoring.   Endocrine: Negative for heat intolerance and polyphagia.  Hematologic/Lymphatic: Negative for bleeding problem. Does not bruise/bleed easily.  Skin: Negative for flushing, nail changes, rash and suspicious lesions.  Musculoskeletal: Negative for arthritis, joint pain, muscle cramps, myalgias, neck pain and stiffness.  Gastrointestinal: Negative for abdominal pain, bowel incontinence, diarrhea and excessive appetite.  Genitourinary: Negative for decreased libido, genital sores and incomplete emptying.  Neurological: Negative for brief paralysis, focal weakness, headaches and loss of balance.  Psychiatric/Behavioral: Negative for altered mental status, depression  and suicidal ideas.  Allergic/Immunologic: Negative for HIV exposure and persistent infections.    EKGs/Labs/Other Studies Reviewed:    The following studies were reviewed today:   EKG:  The ekg ordered today demonstrates   Recent Labs: 03/02/2021: BUN 41; Creatinine, Ser 2.44; Potassium 4.8; Sodium 136  Recent Lipid Panel    Component Value Date/Time   CHOL 191 09/27/2018 1010   TRIG 193 (H) 09/27/2018 1010   HDL 34 (L) 09/27/2018 1010   CHOLHDL 5.6 09/27/2018 1010   VLDL 39 09/27/2018 1010   LDLCALC 118 (H) 09/27/2018 1010    Physical Exam:    VS:  BP 124/86 (BP Location: Left Arm, Patient Position: Sitting, Cuff Size: Large)   Pulse 76   Ht 5\' 11"  (1.803 m)   Wt (!) 355 lb (161 kg)   BMI 49.51 kg/m     Wt Readings from Last 3 Encounters:  04/08/21 (!) 355 lb (161 kg)  03/02/21 (!) 354 lb 3.2 oz (160.7 kg)  12/22/20 (!) 355 lb 6.4 oz (161.2 kg)     GEN: Well nourished, well developed in no acute distress HEENT: Normal NECK: No JVD; No carotid bruits LYMPHATICS: No lymphadenopathy CARDIAC: S1S2 noted,RRR, no murmurs, rubs, gallops RESPIRATORY:  Clear to auscultation without rales, wheezing or rhonchi  ABDOMEN: Soft, non-tender, non-distended, +bowel sounds, no guarding. EXTREMITIES: No edema, No cyanosis, no clubbing MUSCULOSKELETAL:  No deformity  SKIN: Warm and dry NEUROLOGIC:  Alert and oriented x 3, non-focal PSYCHIATRIC:  Normal affect, good insight  ASSESSMENT:    1. Other cardiomyopathy ([place])   2. Essential hypertension   3. Dilated cardiomyopathy ([place])   4. Other sleep apnea   5. Morbid obesity ([place])   6. Mixed hyperlipidemia   7. Type 2 diabetes mellitus with complication, with long-term current use of insulin ([place])   8. Stage 3a chronic kidney disease (CKD) ([facility])    PLAN:    He appeared clinically to be at his baseline. He is in a happy state, looking forward to loosing weight and adopting a healthier life style.   His recent ejection  fraction which was done  in February 2022 showed EF of 40 to 45%.  This is really a great improvement.  We will keep the patient on his current medication regimen.  He has had a previous work-up for his cardiomyopathy which was thought to not be ischemic.  Suspected uncontrolled hypertension versus familiar cardiomyopathy versus diabetes.  The great news is now with his EF at 40 to 45% he does not need ICD therapy.  He still is following with the heart failure clinic.   Blood pressure is acceptable, continue with current antihypertensive regimen.  His diabetes mellitus is being managed by endocrinology as well as primary team.  He also has been following with pharmacy within our group recently.  He continues to follow with a nephrologist for his chronic kidney disease.  The patient is in agreement with the above plan. The patient left the office in stable condition.  The patient will follow up in 1 year or sooner if needed.   Medication Adjustments/Labs and Tests Ordered: Current medicines are reviewed at length with the patient today.  Concerns regarding medicines are outlined above.  No orders of the defined types were placed in this encounter.  No orders of the defined types were placed in this encounter.   Patient Instructions  Medication Instructions:  Your physician recommends that you continue on your current medications as directed. Please refer to the Current Medication list given to you today.  *If you need a refill on your cardiac medications before your next appointment, please call your pharmacy*   Lab Work: None If you have labs (blood work) drawn today and your tests are completely normal, you will receive your results only by: [place] (if you have MyChart) OR A paper copy in the mail If you have any lab test that is abnormal or we need to change your treatment, we will call you to review the results.   Testing/Procedures: None   Follow-Up: At [facility], you and your health needs are our priority.  As part of our continuing mission to provide you with exceptional heart care, we have created designated Provider Care Teams.  These Care Teams include your primary Cardiologist (physician) and Advanced Practice Providers (APPs -  Physician Assistants and Nurse Practitioners) who all work together to provide you with the care you need, when you need it.  We recommend signing up for the patient portal called "MyChart".  Sign up information is provided on this After Visit Summary.  MyChart is used to connect with patients for Virtual Visits (Telemedicine).  Patients are able to view lab/test results, encounter notes, upcoming appointments, etc.  Non-urgent messages can be sent to your provider as well.   To learn more about what you can do with MyChart, go to [email or web address].    Your next appointment:   1 year(s)  The format for your next appointment:   In Person  Provider:   Berniece Salines, DO     Other Instructions     Adopting a Healthy Lifestyle.  Know what a healthy weight is for you (roughly BMI <25) and aim to maintain this   Aim for 7+ servings of fruits and vegetables daily   65-80+ fluid ounces of water or unsweet tea for healthy kidneys   Limit to max 1 drink of alcohol per day; avoid smoking/tobacco   Limit animal fats in diet for cholesterol and heart health - choose grass fed whenever available   Avoid highly processed foods, and foods high in saturated/trans fats  Aim for low stress - take time to unwind and care for your mental health   Aim for 150 min of moderate intensity exercise weekly for heart health, and weights twice weekly for bone health   Aim for 7-9 hours of sleep daily   When it comes to diets, agreement about the perfect plan isnt easy to find, even among the experts. Experts at the [employer] developed an idea known as the Healthy Eating Plate. Just imagine a plate  divided into logical, healthy portions.   The emphasis is on diet quality:   Load up on vegetables and fruits - one-half of your plate: Aim for color and variety, and remember that potatoes dont count.   Go for whole grains - one-quarter of your plate: Whole wheat, barley, wheat berries, quinoa, oats, brown rice, and foods made with them. If you want pasta, go with whole wheat pasta.   Protein power - one-quarter of your plate: Fish, chicken, beans, and nuts are all healthy, versatile protein sources. Limit red meat.   The diet, however, does go beyond the plate, offering a few other suggestions.   Use healthy plant oils, such as olive, canola, soy, corn, sunflower and peanut. Check the labels, and avoid partially hydrogenated oil, which have unhealthy trans fats.   If youre thirsty, drink water. Coffee and tea are good in moderation, but skip sugary drinks and limit milk and dairy products to one or two daily servings.   The type of carbohydrate in the diet is more important than the amount. Some sources of carbohydrates, such as vegetables, fruits, whole grains, and beans-are healthier than others.   Finally, stay active  Signed, Berniece Salines, DO  04/08/2021 8:40 PM    [facility]

## 2021-04-12 ENCOUNTER — Other Ambulatory Visit: Payer: Self-pay

## 2021-04-12 DIAGNOSIS — E119 Type 2 diabetes mellitus without complications: Secondary | ICD-10-CM

## 2021-04-12 DIAGNOSIS — Z6841 Body Mass Index (BMI) 40.0 and over, adult: Secondary | ICD-10-CM

## 2021-04-12 NOTE — Telephone Encounter (Signed)
Faxed refill request for Ozempic 1 mg per dose (1x4MG  Pen), inject 1 mg under the skin once a week received from [place], [place]. Please review refill request.

## 2021-04-13 MED ORDER — OZEMPIC (1 MG/DOSE) 4 MG/3ML ~~LOC~~ SOPN: 1.0000 mg | PEN_INJECTOR | SUBCUTANEOUS | 2 refills | Status: DC

## 2021-07-06 ENCOUNTER — Ambulatory Visit (HOSPITAL_COMMUNITY)
Admission: RE | Admit: 2021-07-06 | Discharge: 2021-07-06 | Disposition: A | Discharge status: Home / Self Care | Payer: Medicaid Other | Source: Ambulatory Visit | Attending: Family Medicine | Admitting: Family Medicine

## 2021-07-06 ENCOUNTER — Other Ambulatory Visit: Payer: Self-pay

## 2021-07-06 ENCOUNTER — Encounter (HOSPITAL_COMMUNITY): Payer: Self-pay

## 2021-07-06 VITALS — BP 116/80 | HR 82 | Wt 348.4 lb

## 2021-07-06 DIAGNOSIS — Z8249 Family history of ischemic heart disease and other diseases of the circulatory system: Secondary | ICD-10-CM | POA: Insufficient documentation

## 2021-07-06 DIAGNOSIS — N1831 Chronic kidney disease, stage 3a: Secondary | ICD-10-CM

## 2021-07-06 DIAGNOSIS — I10 Essential (primary) hypertension: Secondary | ICD-10-CM

## 2021-07-06 DIAGNOSIS — G4733 Obstructive sleep apnea (adult) (pediatric): Secondary | ICD-10-CM | POA: Diagnosis not present

## 2021-07-06 DIAGNOSIS — R1012 Left upper quadrant pain: Secondary | ICD-10-CM

## 2021-07-06 DIAGNOSIS — E1122 Type 2 diabetes mellitus with diabetic chronic kidney disease: Secondary | ICD-10-CM | POA: Insufficient documentation

## 2021-07-06 DIAGNOSIS — E118 Type 2 diabetes mellitus with unspecified complications: Secondary | ICD-10-CM

## 2021-07-06 DIAGNOSIS — R188 Other ascites: Secondary | ICD-10-CM | POA: Insufficient documentation

## 2021-07-06 DIAGNOSIS — Z7985 Long-term (current) use of injectable non-insulin antidiabetic drugs: Secondary | ICD-10-CM | POA: Insufficient documentation

## 2021-07-06 DIAGNOSIS — Z9989 Dependence on other enabling machines and devices: Secondary | ICD-10-CM | POA: Diagnosis not present

## 2021-07-06 DIAGNOSIS — Z79899 Other long term (current) drug therapy: Secondary | ICD-10-CM | POA: Diagnosis not present

## 2021-07-06 DIAGNOSIS — I429 Cardiomyopathy, unspecified: Secondary | ICD-10-CM | POA: Diagnosis not present

## 2021-07-06 DIAGNOSIS — I5022 Chronic systolic (congestive) heart failure: Secondary | ICD-10-CM | POA: Diagnosis not present

## 2021-07-06 DIAGNOSIS — I491 Atrial premature depolarization: Secondary | ICD-10-CM | POA: Diagnosis not present

## 2021-07-06 DIAGNOSIS — K429 Umbilical hernia without obstruction or gangrene: Secondary | ICD-10-CM | POA: Insufficient documentation

## 2021-07-06 DIAGNOSIS — N183 Chronic kidney disease, stage 3 unspecified: Secondary | ICD-10-CM | POA: Insufficient documentation

## 2021-07-06 DIAGNOSIS — Z6841 Body Mass Index (BMI) 40.0 and over, adult: Secondary | ICD-10-CM | POA: Insufficient documentation

## 2021-07-06 DIAGNOSIS — I493 Ventricular premature depolarization: Secondary | ICD-10-CM | POA: Diagnosis not present

## 2021-07-06 DIAGNOSIS — Z7984 Long term (current) use of oral hypoglycemic drugs: Secondary | ICD-10-CM | POA: Insufficient documentation

## 2021-07-06 DIAGNOSIS — F1090 Alcohol use, unspecified, uncomplicated: Secondary | ICD-10-CM | POA: Insufficient documentation

## 2021-07-06 DIAGNOSIS — I130 Hypertensive heart and chronic kidney disease with heart failure and stage 1 through stage 4 chronic kidney disease, or unspecified chronic kidney disease: Secondary | ICD-10-CM | POA: Diagnosis not present

## 2021-07-06 LAB — COMPREHENSIVE METABOLIC PANEL
ALT: 20 U/L (ref 0–44)
AST: 15 U/L (ref 15–41)
Albumin: 3.9 g/dL (ref 3.5–5.0)
Alkaline Phosphatase: 67 U/L (ref 38–126)
Anion gap: 8 (ref 5–15)
BUN: 37 mg/dL — ABNORMAL HIGH (ref 6–20)
CO2: 25 mmol/L (ref 22–32)
Calcium: 9.5 mg/dL (ref 8.9–10.3)
Chloride: 101 mmol/L (ref 98–111)
Creatinine, Ser: 2.84 mg/dL — ABNORMAL HIGH (ref 0.61–1.24)
GFR, Estimated: 26 mL/min — ABNORMAL LOW (ref >60)
Glucose, Bld: 264 mg/dL — ABNORMAL HIGH (ref 70–99)
Potassium: 5.2 mmol/L — ABNORMAL HIGH (ref 3.5–5.1)
Sodium: 134 mmol/L — ABNORMAL LOW (ref 135–145)
Total Bilirubin: 0.6 mg/dL (ref 0.3–1.2)
Total Protein: 7.1 g/dL (ref 6.5–8.1)

## 2021-07-06 LAB — LIPASE, BLOOD: Lipase: 66 U/L — ABNORMAL HIGH (ref 11–51)

## 2021-07-06 LAB — AMYLASE: Amylase: 106 U/L — ABNORMAL HIGH (ref 28–100)

## 2021-07-06 LAB — BRAIN NATRIURETIC PEPTIDE: B Natriuretic Peptide: 51.4 pg/mL (ref 0.0–100.0)

## 2021-07-06 NOTE — Patient Instructions (Signed)
Medication Changes:  None   Lab Work:  Labs done today, your results will be available in MyChart, we will contact you for abnormal readings.   Testing/Procedures:  You have been Scheduled for abdominal ultrasound  Referrals:  none  Special Instructions // Education:  none  Follow-Up in: 4 months with Dr. Aundra Dubin  At the [facility], you and your health needs are our priority. We have a designated team specialized in the treatment of Heart Failure. This Care Team includes your primary Heart Failure Specialized Cardiologist (physician), Advanced Practice Providers (APPs- Physician Assistants and Nurse Practitioners), and Pharmacist who all work together to provide you with the care you need, when you need it.   You may see any of the following providers on your designated Care Team at your next follow up:  Dr Glori Bickers Dr Haynes Kerns, NP Lyda Jester, [place] [facility] [place], [place] Audry Riles, PharmD   Please be sure to bring in all your medications bottles to every appointment.   Need to Contact us:  If you have any questions or concerns before your next appointment please send [place] a message through [place] or call our office at [phone].    TO LEAVE A MESSAGE FOR THE NURSE SELECT OPTION 2, PLEASE LEAVE A MESSAGE INCLUDING: YOUR NAME DATE OF BIRTH CALL BACK NUMBER REASON FOR CALL**this is important as we prioritize the call backs  YOU WILL RECEIVE A CALL BACK THE SAME DAY AS LONG AS YOU CALL BEFORE 4:00 PM

## 2021-07-06 NOTE — Progress Notes (Signed)
PCP: Dr. Glendon Axe HF Cardiology: Dr. Aundra Dubin  56 y.o. yo with CKD stage 3, DM, HTN, and chronic systolic CHF was initially referred by Dr. Tamala Julian at [facility] for evaluation of CHF. Patient was diagnosed with a cardiomyopathy as far back as 2007.  His mother and father both have CHF and have ICDs.  Uncle had a heart transplant.  He has had long-standing HTN and diabetes.  We have record of a Cardiolite in 2017 with EF 25%, no ischemia and an echo in 2017 with EF 35-40%.  He says that with better blood pressure control, his EF returned to normal range on an echo at outside facility.  He was on Entresto in the past and felt like it helped his symptoms, but he has been off it for as his insurance is not covering it.   He was lost to followup in our clinic for about a year but was referred back by Dr. Harriet Masson.   Echo in 11/20 showed EF 35-40%, mild LV dilation, moderate LVH, moderately dilated RV with normal systolic function.   Echo in 7/21 showed EF 40-45%, mild LVH, RV normal. Echo in 2/22 showed EF 45-50% with normal RV.   Today he returns for HF follow up. His main complaint is abdominal bloating/swelling x 1 week. He had some vomiting a few days ago, abdomen is sore and he feels something is wrong. He feels most discomfort in LUQ. Having regular BMs, no abnormal bleeding. He is able to work out 3 days/week but abdominal swelling/discomfort is hindering him. Denies palpitations, CP, dizziness, edema, or PND/Orthopnea. Appetite ok. No fever or chills. Weight at home 345 pounds. Taking all medications. He continues to use CPAP regularly. Drank 4-5 shots of liquior and 4-5 beers this past week for his birthday, but otherwise trying to cut down.  ECG (personally reviewed): NSR 86 bpm  Labs (3/17): K 4.2, creatinine 1.57 Labs (5/20): LDL 118 Labs (4/21): K 5.5, creatinine 2.2 Labs (6/21): K 5, creatinine 2.19 Labs (10/21): K 5, creatinine 2.87 Labs (7/22): K 4.3, creatinine 2.62 Labs  (10/22): K 4.8, creatinine 2.44  PMH: 1. OSA: Uses CPAP.  2. Type 2 diabetes 3. Morbid obesity 4. Hyperlipidemia 5. CKD stage 3: Sees Dr. Marval Regal.  6. Cardiomyopathy: Diagnosed in 2007. Presumed nonischemic, possibly related to long-standing HTN versus familial cardiomyopathy.  - Cardiolite in 2017: EF 25%, no ischemia.  - Echo (2017): EF 35-40%.  - Echo (11/20): EF 35-40%, mildly dilated LV, moderate LVH, moderate RV dilation with normal systolic function.  - Echo (7/21): EF 40-45%, mild LVH, RV normal.  - Echo (2/22): EF 45-50% with normal RV.  7. HTN  Social History   Socioeconomic History   Marital status: Divorced    Spouse name: Not on file   Number of children: Not on file   Years of education: Not on file   Highest education level: Not on file  Occupational History   Not on file  Tobacco Use   Smoking status: Never   Smokeless tobacco: Never  Vaping Use   Vaping Use: Never used  Substance and Sexual Activity   Alcohol use: Yes    Alcohol/week: 2.0 standard drinks    Types: 1 Cans of beer, 1 Shots of liquor per week    Comment: socially   Drug use: Yes    Types: Marijuana    Comment: once weekly   Sexual activity: Not on file  Other Topics Concern   Not on file  Social History Narrative   Not on file   Social Determinants of Health   Financial Resource Strain: Not on file  Food Insecurity: Not on file  Transportation Needs: Not on file  Physical Activity: Not on file  Stress: Not on file  Social Connections: Not on file  Intimate Partner Violence: Not on file   FH: Uncle with heart transplant, mother with CHF/ICD, father with CHF/ICD  ROS: All systems reviewed and negative except as per HPI.   Current Outpatient Medications  Medication Sig Dispense Refill   albuterol (PROVENTIL HFA;VENTOLIN HFA) 108 (90 BASE) MCG/ACT inhaler Inhale 2 puffs into the lungs every 4 (four) hours as needed for wheezing or shortness of breath.     amLODipine (NORVASC)  10 MG tablet Take 10 mg by mouth daily.     atorvastatin (LIPITOR) 10 MG tablet Take 10 mg by mouth daily.     carvedilol (COREG) 25 MG tablet Take 1.5 tablets (37.5 mg total) by mouth 2 (two) times daily with a meal. 270 tablet 3   cholecalciferol (VITAMIN D) 1000 UNITS tablet Take 3,000 Units by mouth every morning.      colchicine 0.6 MG tablet Take 0.6 mg by mouth as needed.      dapagliflozin propanediol (FARXIGA) 10 MG TABS tablet Take 1 tablet (10 mg total) by mouth daily before breakfast. 30 tablet 11   furosemide (LASIX) 40 MG tablet Take 1 tablet (40 mg total) by mouth as needed. 30 tablet 11   hydrALAZINE (APRESOLINE) 100 MG tablet TAKE 1 TABLET(100 MG) BY MOUTH TWICE DAILY 180 tablet 3   isosorbide mononitrate (IMDUR) 30 MG 24 hr tablet Take 30 mg by mouth in the morning and at bedtime.     losartan (COZAAR) 50 MG tablet Take 1 tablet (50 mg total) by mouth daily. 30 tablet 11   Magnesium 250 MG TABS Take 1 tablet by mouth every morning.      Semaglutide, 1 MG/DOSE, (OZEMPIC, 1 MG/DOSE,) 4 MG/3ML SOPN Inject 1 mg into the skin once a week. 3 mL 2   spironolactone (ALDACTONE) 25 MG tablet TAKE 1 TABLET(25 MG) BY MOUTH DAILY 30 tablet 11   Testosterone 20.25 MG/1.25GM (1.62%) GEL Place 1 application onto the skin every morning.      No current facility-administered medications for this encounter.   Wt Readings from Last 3 Encounters:  07/06/21 (!) 158 kg (348 lb 6.4 oz)  04/08/21 (!) 161 kg (355 lb)  03/02/21 (!) 160.7 kg (354 lb 3.2 oz)   BP 116/80    Pulse 82    Wt (!) 158 kg (348 lb 6.4 oz)    SpO2 98%    BMI 48.59 kg/m  General:  NAD. No resp difficulty HEENT: Normal Neck: Supple. Thick neck. No JVD. Carotids 2+ bilat; no bruits. No lymphadenopathy or thryomegaly appreciated. Cor: PMI nondisplaced. Regular rate & rhythm. No rubs, gallops or murmurs. Lungs: Clear Abdomen: Obese, TTP LUQ/LLQ, + distended. No hepatosplenomegaly. No bruits, ? Dense area to palpation to LLQ.  Good bowel sounds. + umbilical hernia, exam difficult due to body habitus. Extremities: No cyanosis, clubbing, rash, edema Neuro: Alert & oriented x 3, cranial nerves grossly intact. Moves all 4 extremities w/o difficulty. Affect pleasant.  Assessment/Plan: 1. Cardiomyopathy/chronic systolic CHF: Uncertain etiology but suspect related to uncontrolled hypertension vs diabetes vs a familial cardiomyopathy given extensive family history.  He had a Cardiolite in 2017 that did not show ischemia.  Echo in 2/22 with EF up to  45-50%.  NYHA class II symptoms, not volume overloaded on exam, weight down 8 lbs.  - Continue Coreg 37.5 mg bid.   - Continue spironolactone 25 mg daily. He has needed Veltassa in the past but is currently off.  BMET today.  - Continue hydralazine 100 mg bid (cannot remember to take tid) and Imdur 60 mg daily.  - Continue losartan 50 mg daily, he is unable to tolerate Entresto due to fatigue.  Would not increase with elevated creatinine.  - Continue dapagliflozin 10 mg daily.   - Continue Lasix PRN. - EF is out of ICD range.  2. HTN: Well-controlled.  3. Type II diabetes: He is on [place].   4. Morbid obesity: Weight coming down. Body mass index is 48.59 kg/m. - He is on semaglutide.      5. OSA: Continue CPAP.  6. CKD: Stage 3.  Check BMET today.  Sees Dr. Marval Regal.  7. Abdominal swelling/discomfort: Colonoscopy (2015) with mild diverticulitis and single polyp, benign on path. Abdominal exam difficult for fluid due to body habitus. Check LFTs, lipase/amylase and obtain abdominal ultrasound to evaluate for ascites/organomegaly. If unrevealing, will need further work up with PCP or GI (he has seen Dr. Henrene Pastor in the past). - Discussed limiting/stopping all ETOH.  Followup 4 months with Dr. Aundra Dubin.  Maricela Bo [facility] FNP-BC 07/06/2021

## 2021-07-07 ENCOUNTER — Other Ambulatory Visit: Payer: Self-pay | Admitting: Cardiology

## 2021-07-07 DIAGNOSIS — E119 Type 2 diabetes mellitus without complications: Secondary | ICD-10-CM

## 2021-07-08 ENCOUNTER — Telehealth (HOSPITAL_COMMUNITY): Payer: Self-pay

## 2021-07-08 DIAGNOSIS — I5022 Chronic systolic (congestive) heart failure: Secondary | ICD-10-CM

## 2021-07-08 MED ORDER — SPIRONOLACTONE 25 MG PO TABS: 12.5000 mg | ORAL_TABLET | Freq: Every day | ORAL | 11 refills | Status: DC

## 2021-07-08 NOTE — Telephone Encounter (Signed)
Patient advised and verbalized understanding. Med list updated to reflect changes. Patient will have repeat labs done at [place].  ? ?Meds ordered this encounter  ?Medications  ? spironolactone (ALDACTONE) 25 MG tablet  ?  Sig: Take 0.5 tablets (12.5 mg total) by mouth daily.  ?  Dispense:  30 tablet  ?  Refill:  11  ?  Please cancel all previous orders for current medication. Change in dosage or pill size.  ? ?Orders Placed This Encounter  ?Procedures  ? Basic metabolic panel  ?  Standing Status:   Future  ?  Standing Expiration Date:   07/09/2022  ?  Order Specific Question:   Release to patient  ?  Answer:   Immediate  ? ? ?

## 2021-07-08 NOTE — Telephone Encounter (Signed)
-----   Message from Rafael Bihari, [place] sent at 07/06/2021  2:07 PM EST ----- ?K elevated. Decrease spiro to 12.5 mg daily. Make sure he is not taking any K supplements.  ? ?Amylase and lipase minimally elevated, but not markedly so that it would indicate pancreatitis. ? ?Repeat BMET in 10 days. ?

## 2021-07-15 LAB — BASIC METABOLIC PANEL
BUN/Creatinine Ratio: 13 (ref 9–20)
BUN: 33 mg/dL — ABNORMAL HIGH (ref 6–24)
CO2: 22 mmol/L (ref 20–29)
Calcium: 9.9 mg/dL (ref 8.7–10.2)
Chloride: 102 mmol/L (ref 96–106)
Creatinine, Ser: 2.58 mg/dL — ABNORMAL HIGH (ref 0.76–1.27)
Glucose: 192 mg/dL — ABNORMAL HIGH (ref 70–99)
Potassium: 5.1 mmol/L (ref 3.5–5.2)
Sodium: 138 mmol/L (ref 134–144)
eGFR: 29 mL/min/{1.73_m2} — ABNORMAL LOW (ref >59)

## 2021-07-19 ENCOUNTER — Ambulatory Visit (HOSPITAL_COMMUNITY)
Admission: RE | Admit: 2021-07-19 | Discharge: 2021-07-19 | Disposition: A | Discharge status: Home / Self Care | Payer: Medicaid Other | Source: Ambulatory Visit | Attending: Family Medicine | Admitting: Family Medicine

## 2021-07-19 ENCOUNTER — Other Ambulatory Visit: Payer: Self-pay

## 2021-07-19 DIAGNOSIS — R188 Other ascites: Secondary | ICD-10-CM | POA: Diagnosis present

## 2021-08-20 ENCOUNTER — Other Ambulatory Visit (HOSPITAL_COMMUNITY): Payer: Self-pay | Admitting: Cardiology

## 2021-11-03 ENCOUNTER — Ambulatory Visit (HOSPITAL_COMMUNITY)
Admission: RE | Admit: 2021-11-03 | Discharge: 2021-11-03 | Disposition: A | Discharge status: Home / Self Care | Payer: Medicaid Other | Source: Ambulatory Visit | Attending: Cardiology | Admitting: Cardiology

## 2021-11-03 ENCOUNTER — Encounter (HOSPITAL_COMMUNITY): Payer: Self-pay | Admitting: Cardiology

## 2021-11-03 VITALS — BP 138/80 | HR 79 | Wt 337.6 lb

## 2021-11-03 DIAGNOSIS — Z7984 Long term (current) use of oral hypoglycemic drugs: Secondary | ICD-10-CM | POA: Diagnosis not present

## 2021-11-03 DIAGNOSIS — E1122 Type 2 diabetes mellitus with diabetic chronic kidney disease: Secondary | ICD-10-CM | POA: Insufficient documentation

## 2021-11-03 DIAGNOSIS — Z79899 Other long term (current) drug therapy: Secondary | ICD-10-CM | POA: Insufficient documentation

## 2021-11-03 DIAGNOSIS — I5022 Chronic systolic (congestive) heart failure: Secondary | ICD-10-CM | POA: Diagnosis not present

## 2021-11-03 DIAGNOSIS — G4733 Obstructive sleep apnea (adult) (pediatric): Secondary | ICD-10-CM | POA: Insufficient documentation

## 2021-11-03 DIAGNOSIS — I130 Hypertensive heart and chronic kidney disease with heart failure and stage 1 through stage 4 chronic kidney disease, or unspecified chronic kidney disease: Secondary | ICD-10-CM | POA: Diagnosis present

## 2021-11-03 DIAGNOSIS — R109 Unspecified abdominal pain: Secondary | ICD-10-CM | POA: Diagnosis not present

## 2021-11-03 DIAGNOSIS — I429 Cardiomyopathy, unspecified: Secondary | ICD-10-CM | POA: Insufficient documentation

## 2021-11-03 DIAGNOSIS — Z8249 Family history of ischemic heart disease and other diseases of the circulatory system: Secondary | ICD-10-CM | POA: Insufficient documentation

## 2021-11-03 DIAGNOSIS — N183 Chronic kidney disease, stage 3 unspecified: Secondary | ICD-10-CM | POA: Insufficient documentation

## 2021-11-03 DIAGNOSIS — Z7985 Long-term (current) use of injectable non-insulin antidiabetic drugs: Secondary | ICD-10-CM | POA: Insufficient documentation

## 2021-11-03 DIAGNOSIS — E782 Mixed hyperlipidemia: Secondary | ICD-10-CM

## 2021-11-03 DIAGNOSIS — G8929 Other chronic pain: Secondary | ICD-10-CM | POA: Diagnosis not present

## 2021-11-03 DIAGNOSIS — R1012 Left upper quadrant pain: Secondary | ICD-10-CM

## 2021-11-03 LAB — BASIC METABOLIC PANEL
Anion gap: 13 (ref 5–15)
BUN: 37 mg/dL — ABNORMAL HIGH (ref 6–20)
CO2: 20 mmol/L — ABNORMAL LOW (ref 22–32)
Calcium: 9.4 mg/dL (ref 8.9–10.3)
Chloride: 105 mmol/L (ref 98–111)
Creatinine, Ser: 2.56 mg/dL — ABNORMAL HIGH (ref 0.61–1.24)
GFR, Estimated: 29 mL/min — ABNORMAL LOW (ref >60)
Glucose, Bld: 174 mg/dL — ABNORMAL HIGH (ref 70–99)
Potassium: 4.3 mmol/L (ref 3.5–5.1)
Sodium: 138 mmol/L (ref 135–145)

## 2021-11-03 LAB — LIPID PANEL
Cholesterol: 169 mg/dL (ref 0–200)
HDL: 31 mg/dL — ABNORMAL LOW (ref >40)
LDL Cholesterol: 102 mg/dL — ABNORMAL HIGH (ref 0–99)
Total CHOL/HDL Ratio: 5.5 RATIO
Triglycerides: 178 mg/dL — ABNORMAL HIGH (ref <150)
VLDL: 36 mg/dL (ref 0–40)

## 2021-11-03 LAB — BRAIN NATRIURETIC PEPTIDE: B Natriuretic Peptide: 113.7 pg/mL — ABNORMAL HIGH (ref 0.0–100.0)

## 2021-11-03 NOTE — Patient Instructions (Signed)
There has been no changes to your medications.  Labs done today, your results will be available in MyChart, we will contact you for abnormal readings.  Your physician has requested that you have an echocardiogram. Echocardiography is a painless test that uses sound waves to create images of your heart. It provides your doctor with information about the size and shape of your heart and how well your heart's chambers and valves are working. This procedure takes approximately one hour. There are no restrictions for this procedure. You will be called to arrange the test.  Your physician recommends that you schedule a follow-up appointment in: 4 months   If you have any questions or concerns before your next appointment please send [place] a message through [place] or call our office at [phone].    TO LEAVE A MESSAGE FOR THE NURSE SELECT OPTION 2, PLEASE LEAVE A MESSAGE INCLUDING: YOUR NAME DATE OF BIRTH CALL BACK NUMBER REASON FOR CALL**this is important as we prioritize the call backs  YOU WILL RECEIVE A CALL BACK THE SAME DAY AS LONG   At the [facility], you and your health needs are our priority. As part of our continuing mission to provide you with exceptional heart care, we have created designated Provider Care Teams. These Care Teams include your primary Cardiologist (physician) and Advanced Practice Providers (APPs- Physician Assistants and Nurse Practitioners) who all work together to provide you with the care you need, when you need it.   You may see any of the following providers on your designated Care Team at your next follow up: Dr Glori Bickers Dr Haynes Kerns, NP Lyda Jester, [place] [facility] [place], [place] Audry Riles, PharmD   Please be sure to bring in all your medications bottles to every appointment.

## 2021-11-03 NOTE — Progress Notes (Signed)
PCP: Dr. Glendon Axe HF Cardiology: Dr. Aundra Dubin  56 y.o. yo with CKD stage 3, DM, HTN, and chronic systolic CHF was initially referred by Dr. Tamala Julian at [facility] for evaluation of CHF. Patient was diagnosed with a cardiomyopathy as far back as 2007.  His mother and father both have CHF and have ICDs.  Uncle had a heart transplant.  He has had long-standing HTN and diabetes.  We have record of a Cardiolite in 2017 with EF 25%, no ischemia and an echo in 2017 with EF 35-40%.  He says that with better blood pressure control, his EF returned to normal range on an echo at outside facility.  He was on Entresto in the past and felt like it helped his symptoms, but he has been off it for as his insurance is not covering it.   He was lost to followup in our clinic for about a year but was referred back by Dr. Harriet Masson.   Echo in 11/20 showed EF 35-40%, mild LV dilation, moderate LVH, moderately dilated RV with normal systolic function.   Echo in 7/21 showed EF 40-45%, mild LVH, RV normal. Echo in 2/22 showed EF 45-50% with normal RV.   He was unable to tolerate low dose Entresto.   He returns for followup of CHF.   He continues to use CPAP regularly.  Weight is down 11 lbs, continues to use semaglutide.  No significant exertional dyspnea or chest pain.  No orthopnea/PND.  He has chronic abdominal pain that started prior to starting semaglutide and has been bothersome to him for a long time now.  No orthopnea/PND.  He has mostly stopped drinking ETOH.    Labs (3/17): K 4.2, creatinine 1.57 Labs (5/20): LDL 118 Labs (4/21): K 5.5, creatinine 2.2 Labs (6/21): K 5, creatinine 2.19 Labs (10/21): K 5, creatinine 2.87 Labs (7/22): K 4.3, creatinine 2.62 Labs (2/23): K 5.2, creatinine 2.84 (diarrheal illness).   PMH: 1. OSA: Uses CPAP.  2. Type 2 diabetes 3. Morbid obesity 4. Hyperlipidemia 5. CKD stage 3: Sees Dr. Marval Regal.  6. Cardiomyopathy: Diagnosed in 2007. Presumed nonischemic, possibly  related to long-standing HTN versus familial cardiomyopathy.  - Cardiolite in 2017: EF 25%, no ischemia.  - Echo (2017): EF 35-40%.  - Echo (11/20): EF 35-40%, mildly dilated LV, moderate LVH, moderate RV dilation with normal systolic function.  - Echo (7/21): EF 40-45%, mild LVH, RV normal.  - Echo (2/22): EF 45-50% with normal RV.  7. HTN  Social History   Socioeconomic History   Marital status: Divorced    Spouse name: Not on file   Number of children: Not on file   Years of education: Not on file   Highest education level: Not on file  Occupational History   Not on file  Tobacco Use   Smoking status: Never   Smokeless tobacco: Never  Vaping Use   Vaping Use: Never used  Substance and Sexual Activity   Alcohol use: Yes    Alcohol/week: 2.0 standard drinks of alcohol    Types: 1 Cans of beer, 1 Shots of liquor per week    Comment: socially   Drug use: Yes    Types: Marijuana    Comment: once weekly   Sexual activity: Not on file  Other Topics Concern   Not on file  Social History Narrative   Not on file   Social Determinants of Health   Financial Resource Strain: Not on file  Food Insecurity: Not on file  Transportation Needs: Not on file  Physical Activity: Not on file  Stress: Not on file  Social Connections: Not on file  Intimate Partner Violence: Not on file   FH: Uncle with heart transplant, mother with CHF/ICD, father with CHF/ICD  ROS: All systems reviewed and negative except as per HPI.   Current Outpatient Medications  Medication Sig Dispense Refill   albuterol (PROVENTIL HFA;VENTOLIN HFA) 108 (90 BASE) MCG/ACT inhaler Inhale 2 puffs into the lungs every 4 (four) hours as needed for wheezing or shortness of breath.     amLODipine (NORVASC) 10 MG tablet Take 10 mg by mouth daily.     atorvastatin (LIPITOR) 10 MG tablet Take 10 mg by mouth daily.     carvedilol (COREG) 25 MG tablet Take 1.5 tablets (37.5 mg total) by mouth 2 (two) times daily with a  meal. 270 tablet 3   cholecalciferol (VITAMIN D) 1000 UNITS tablet Take 3,000 Units by mouth every morning.      dapagliflozin propanediol (FARXIGA) 10 MG TABS tablet Take 1 tablet (10 mg total) by mouth daily before breakfast. 30 tablet 11   furosemide (LASIX) 40 MG tablet Take 1 tablet (40 mg total) by mouth as needed. 30 tablet 11   hydrALAZINE (APRESOLINE) 100 MG tablet TAKE 1 TABLET(100 MG) BY MOUTH TWICE DAILY 180 tablet 3   isosorbide mononitrate (IMDUR) 30 MG 24 hr tablet Take 30 mg by mouth in the morning and at bedtime.     losartan (COZAAR) 50 MG tablet Take 1 tablet (50 mg total) by mouth daily. 30 tablet 11   Magnesium 250 MG TABS Take 1 tablet by mouth every morning.      Semaglutide (OZEMPIC, 2 MG/DOSE, [place]) Inject 2 mg into the skin once a week.     spironolactone (ALDACTONE) 25 MG tablet Take 0.5 tablets (12.5 mg total) by mouth daily. 30 tablet 11   Testosterone 20.25 MG/1.25GM (1.62%) GEL Place 1 application onto the skin every morning.      No current facility-administered medications for this encounter.   BP 138/80   Pulse 79   Wt (!) 153.1 kg (337 lb 9.6 oz)   SpO2 99%   BMI 47.09 kg/m  General: NAD, obese.  Neck: No JVD, no thyromegaly or thyroid nodule.  Lungs: Clear to auscultation bilaterally with normal respiratory effort. CV: Nondisplaced PMI.  Heart regular S1/S2, no S3/S4, no murmur.  No peripheral edema.  No carotid bruit.  Normal pedal pulses.  Abdomen: Soft, nontender, no hepatosplenomegaly, no distention.  Skin: Intact without lesions or rashes.  Neurologic: Alert and oriented x 3.  Psych: Normal affect. Extremities: No clubbing or cyanosis.  HEENT: Normal.   Assessment/Plan: 1. Cardiomyopathy/chronic systolic CHF: Uncertain etiology but suspect related to uncontrolled hypertension vs diabetes vs a familial cardiomyopathy given extensive family history.  He had a Cardiolite in 2017 that did not show ischemia.  Echo in 2/22 with EF up to 45-50%.  NYHA  class I-II symptoms, not volume overloaded on exam.  - Continue Coreg to 37.5 mg bid.   - Continue spironolactone 12.5 mg daily. Dose cut back due to elevated K and creatinine.  BMET today.   - Continue hydralazine 100 mg bid (cannot remember to take tid) and Imdur 60 mg daily.  - Continue losartan 50 mg daily, he was unable to tolerate Entresto due to fatigue.  Would not increase with elevated creatinine.  - Continue dapagliflozin 10 mg daily.   - EF is out of ICD range.  -  Due for repeat echo, will arrange.  2. HTN: BP under better control.   3. Type II diabetes: He is on [place].   4. Morbid obesity:  Weight coming down on semaglutide.       5. OSA: Continue CPAP.  6. CKD: Stage 3.  Check BMET today.  Sees Dr. Marval Regal.  7. Chronic abdominal pain: Dates from prior to starting semaglutide.  Ongoing and bothersome.  No diarrhea, no BRBPR/melena.   - Refer to [place] for evaluation.   Followup 4 months with APP.    Loralie Champagne 11/03/2021

## 2021-11-08 ENCOUNTER — Telehealth (HOSPITAL_COMMUNITY): Payer: Self-pay | Admitting: Surgery

## 2021-11-08 DIAGNOSIS — E782 Mixed hyperlipidemia: Secondary | ICD-10-CM

## 2021-11-08 NOTE — Telephone Encounter (Signed)
-----   Message from Larey Dresser, MD sent at 11/03/2021  4:31 PM EDT ----- Increase atorvastatin to 20 mg daily, lipids/LFTsin 2 months.

## 2021-11-08 NOTE — Telephone Encounter (Signed)
I contacted patient to review results and recommendations per Dr. Aundra Dubin.  I have updated medication list in [facility] and scheduled repeat lab work.  Patient is aware and agreeable.

## 2021-11-09 ENCOUNTER — Other Ambulatory Visit: Payer: Self-pay | Admitting: Cardiology

## 2021-11-09 ENCOUNTER — Other Ambulatory Visit (HOSPITAL_COMMUNITY): Payer: Self-pay | Admitting: Cardiology

## 2021-11-10 ENCOUNTER — Ambulatory Visit (HOSPITAL_BASED_OUTPATIENT_CLINIC_OR_DEPARTMENT_OTHER)
Admission: RE | Admit: 2021-11-10 | Discharge: 2021-11-10 | Disposition: A | Discharge status: Home / Self Care | Payer: Medicaid Other | Source: Ambulatory Visit | Attending: Cardiology | Admitting: Cardiology

## 2021-11-10 DIAGNOSIS — I5022 Chronic systolic (congestive) heart failure: Secondary | ICD-10-CM | POA: Diagnosis present

## 2021-11-10 LAB — ECHOCARDIOGRAM COMPLETE
AR max vel: 1.68 cm2
AV Area VTI: 1.8 cm2
AV Area mean vel: 1.72 cm2
AV Mean grad: 4 mmHg
AV Peak grad: 7.8 mmHg
Ao pk vel: 1.4 m/s
Area-P 1/2: 5.34 cm2
Calc EF: 30.3 %
S' Lateral: 5.4 cm
Single Plane A2C EF: 46.6 %
Single Plane A4C EF: 22.5 %

## 2021-11-10 MED ORDER — PERFLUTREN LIPID MICROSPHERE
1.0000 mL | INTRAVENOUS | Status: AC | PRN
Start: 2021-11-10 — End: 2021-11-10
  Administered 2021-11-10: 2 mL via INTRAVENOUS

## 2021-12-19 ENCOUNTER — Other Ambulatory Visit (HOSPITAL_COMMUNITY): Payer: Self-pay | Admitting: Cardiology

## 2021-12-22 ENCOUNTER — Ambulatory Visit: Payer: Medicaid Other | Admitting: Internal Medicine

## 2021-12-23 ENCOUNTER — Encounter: Payer: Self-pay | Admitting: Internal Medicine

## 2021-12-26 NOTE — Progress Notes (Signed)
Subjective:    Patient ID: Dustin Mcclain, male    DOB: Feb 12, 1966, 56 y.o.   MRN: [id number]  HPI male never smoker followed for OSA/insomnia, complicated by obesity, HBP, cardiomyopathy, renal insufficiency, DM NPSG 08/15/16   AHI 10.8/ hr, desaturation to 83%, body weight 365 lbs   .-------------------------------------------------------------------------------------  12/22/20- 56 year old male never smoker followed for OSA/insomnia, complicated by Obesity, HTN, Cardiomyopathy/CHF, renal insufficiency, DM2, GERD, CKD3, Anemia,  -albuterol hfa,  CPAP auto 5-12/Adapt Download- compliance 80%, AHI 1.1/ hr Body weight today-355 lbs Covid vax- 2 Phizer Very pleased with his CPAP.  Download reviewed. He credits CPAP for better cardiology reports. Sleeps well.   12/27/21-  56 year old male never smoker followed for OSA/insomnia, complicated by Obesity, HTN, Cardiomyopathy/CHF, renal insufficiency, DM2, GERD, CKD3, Anemia,  -albuterol hfa,  CPAP auto 5-12/Adapt    AirSense 10 AutoSet Download- compliance 93%, AHI 0.5/ hr Body weight today-339 lbs Download reviewed.  He feels he is doing well with new headgear and nasal pillows. On Ozempic now-helps with blood sugar and hopefully will help with his weight.  ROS-see HPI    + = positive Constitutional:   +  weight loss, night sweats, fevers, chills, fatigue, lassitude. HEENT:   No-  headaches, difficulty swallowing, tooth/dental problems, sore throat,       No-  sneezing, itching, ear ache, nasal congestion, post nasal drip,  CV:  No-   chest pain, orthopnea, PND, swelling in lower extremities, anasarca,  dizziness, palpitations Resp:+shortness of breath with exertion or at rest.              No-   productive cough,  No non-productive cough,  No- coughing up of blood.              No-   change in color of mucus.  No- wheezing.   Skin: No-   rash or lesions. GI:  No-   heartburn, indigestion, abdominal pain, nausea, vomiting,  GU: . MS:   No-   joint pain or swelling.  Neuro-     nothing unusual Psych:  No- change in mood or affect. No depression or anxiety.  No memory loss.  Objective:   Physical Exam General- Alert, Oriented, Affect-appropriate, Distress- none acute   +morbidly obese Skin- rash-none, lesions- none, excoriation- none Lymphadenopathy- none Head- atraumatic            Eyes- Gross vision intact, PERRLA, conjunctivae clear secretions            Ears- Hearing, canals normal            Nose- Clear, no- Septal dev, mucus, polyps, erosion, perforation             Throat- Mallampati III-IV , mucosa clear , drainage- none, tonsils- atrophic Neck- flexible , trachea midline, no stridor , thyroid nl, carotid no bruit Chest - symmetrical excursion , unlabored           Heart/CV- RRR , no murmur , no gallop  , no rub, nl s1 s2                           - JVD- none , edema- none, stasis changes- none, varices- none           Lung- clear to P&A, wheeze- none, cough- none , dullness-none, rub- none           Chest wall-  Abd-  Br/ Gen/ Rectal- Not done, not indicated  Extrem- cyanosis- none, clubbing, none, atrophy- none, strength- nl Neuro- grossly intact to observation  Assessment & Plan:

## 2021-12-27 ENCOUNTER — Ambulatory Visit (INDEPENDENT_AMBULATORY_CARE_PROVIDER_SITE_OTHER): Payer: Medicaid Other | Admitting: Internal Medicine

## 2021-12-27 ENCOUNTER — Encounter: Payer: Self-pay | Admitting: Internal Medicine

## 2021-12-27 DIAGNOSIS — Z6841 Body Mass Index (BMI) 40.0 and over, adult: Secondary | ICD-10-CM

## 2021-12-27 DIAGNOSIS — G4733 Obstructive sleep apnea (adult) (pediatric): Secondary | ICD-10-CM | POA: Diagnosis not present

## 2021-12-27 NOTE — Patient Instructions (Signed)
Glad you are doing well !   We can continue CPAP auto 5-12.  Please call if we can help

## 2022-01-12 NOTE — Assessment & Plan Note (Signed)
Now on Ozempic, he is hoping for significant improvement in his weight.

## 2022-01-12 NOTE — Assessment & Plan Note (Signed)
Effects from CPAP with good compliance and control. Plan-continue auto 5-12

## 2022-01-13 ENCOUNTER — Ambulatory Visit (HOSPITAL_COMMUNITY)
Admission: RE | Admit: 2022-01-13 | Discharge: 2022-01-13 | Disposition: A | Discharge status: Home / Self Care | Payer: Medicaid Other | Source: Ambulatory Visit | Attending: Internal Medicine | Admitting: Internal Medicine

## 2022-01-13 DIAGNOSIS — E782 Mixed hyperlipidemia: Secondary | ICD-10-CM | POA: Diagnosis present

## 2022-01-13 LAB — HEPATIC FUNCTION PANEL
ALT: 26 U/L (ref 0–44)
AST: 18 U/L (ref 15–41)
Albumin: 3.7 g/dL (ref 3.5–5.0)
Alkaline Phosphatase: 68 U/L (ref 38–126)
Bilirubin, Direct: <0.1 mg/dL (ref 0.0–0.2)
Total Bilirubin: 0.4 mg/dL (ref 0.3–1.2)
Total Protein: 7.3 g/dL (ref 6.5–8.1)

## 2022-01-13 LAB — LIPID PANEL
Cholesterol: 175 mg/dL (ref 0–200)
HDL: 28 mg/dL — ABNORMAL LOW (ref >40)
LDL Cholesterol: 112 mg/dL — ABNORMAL HIGH (ref 0–99)
Total CHOL/HDL Ratio: 6.3 RATIO
Triglycerides: 174 mg/dL — ABNORMAL HIGH (ref <150)
VLDL: 35 mg/dL (ref 0–40)

## 2022-01-14 ENCOUNTER — Telehealth (HOSPITAL_COMMUNITY): Payer: Self-pay | Admitting: Surgery

## 2022-01-14 DIAGNOSIS — E782 Mixed hyperlipidemia: Secondary | ICD-10-CM

## 2022-01-14 NOTE — Telephone Encounter (Signed)
I called patient to review results and recommendations per provider.  He tells me that he has not been taking any Atorvastatin and needs a refill.  I have sent in refill for new dosage and added Lipid panel to scheduled appt in late October.

## 2022-01-14 NOTE — Telephone Encounter (Signed)
-----   Message from Larey Dresser, MD sent at 01/13/2022  4:23 PM EDT ----- Increase atorvastatin to 40 mg daily for goal LDL < 70 with diabetes.  Lipids/LFTs in 2 months.

## 2022-02-08 ENCOUNTER — Encounter: Payer: Self-pay | Admitting: Physician Assistant

## 2022-03-03 ENCOUNTER — Encounter (HOSPITAL_COMMUNITY): Payer: Self-pay

## 2022-03-03 ENCOUNTER — Ambulatory Visit (HOSPITAL_COMMUNITY)
Admission: RE | Admit: 2022-03-03 | Discharge: 2022-03-03 | Disposition: A | Discharge status: Home / Self Care | Payer: Medicaid Other | Source: Ambulatory Visit | Attending: Cardiology | Admitting: Cardiology

## 2022-03-03 VITALS — BP 146/44 | HR 82 | Wt 336.2 lb

## 2022-03-03 DIAGNOSIS — E1122 Type 2 diabetes mellitus with diabetic chronic kidney disease: Secondary | ICD-10-CM | POA: Diagnosis not present

## 2022-03-03 DIAGNOSIS — Z8249 Family history of ischemic heart disease and other diseases of the circulatory system: Secondary | ICD-10-CM | POA: Insufficient documentation

## 2022-03-03 DIAGNOSIS — I5022 Chronic systolic (congestive) heart failure: Secondary | ICD-10-CM | POA: Diagnosis not present

## 2022-03-03 DIAGNOSIS — M25569 Pain in unspecified knee: Secondary | ICD-10-CM | POA: Diagnosis not present

## 2022-03-03 DIAGNOSIS — I130 Hypertensive heart and chronic kidney disease with heart failure and stage 1 through stage 4 chronic kidney disease, or unspecified chronic kidney disease: Secondary | ICD-10-CM | POA: Insufficient documentation

## 2022-03-03 DIAGNOSIS — N183 Chronic kidney disease, stage 3 unspecified: Secondary | ICD-10-CM | POA: Insufficient documentation

## 2022-03-03 DIAGNOSIS — G4733 Obstructive sleep apnea (adult) (pediatric): Secondary | ICD-10-CM | POA: Diagnosis not present

## 2022-03-03 DIAGNOSIS — Z79899 Other long term (current) drug therapy: Secondary | ICD-10-CM | POA: Insufficient documentation

## 2022-03-03 LAB — BASIC METABOLIC PANEL
Anion gap: 10 (ref 5–15)
BUN: 47 mg/dL — ABNORMAL HIGH (ref 6–20)
CO2: 24 mmol/L (ref 22–32)
Calcium: 9.5 mg/dL (ref 8.9–10.3)
Chloride: 102 mmol/L (ref 98–111)
Creatinine, Ser: 3.31 mg/dL — ABNORMAL HIGH (ref 0.61–1.24)
GFR, Estimated: 21 mL/min — ABNORMAL LOW (ref >60)
Glucose, Bld: 193 mg/dL — ABNORMAL HIGH (ref 70–99)
Potassium: 5.1 mmol/L (ref 3.5–5.1)
Sodium: 136 mmol/L (ref 135–145)

## 2022-03-03 NOTE — Progress Notes (Signed)
PCP: Dr. Glendon Axe HF Cardiology: Dr. Aundra Dubin  56 y.o. yo with CKD stage 3, DM, HTN, and chronic systolic CHF was initially referred by Dr. Tamala Julian at [facility] for evaluation of CHF. Patient was diagnosed with a cardiomyopathy as far back as 2007.  His mother and father both have CHF and have ICDs.  Uncle had a heart transplant.  He has had long-standing HTN and diabetes.  We have record of a Cardiolite in 2017 with EF 25%, no ischemia and an echo in 2017 with EF 35-40%.  He says that with better blood pressure control, his EF returned to normal range on an echo at outside facility.  He was on Entresto in the past and felt like it helped his symptoms, but he has been off it for as his insurance is not covering it.   He was lost to followup in our clinic for about a year but was referred back by Dr. Harriet Masson.   Echo in 11/20 showed EF 35-40%, mild LV dilation, moderate LVH, moderately dilated RV with normal systolic function.   Echo in 7/21 showed EF 40-45%, mild LVH, RV normal. Echo in 2/22 showed EF 45-50% with normal RV.   He was unable to tolerate low dose Entresto. Now on losartan.   Echo 7/23 EF 40-45%, RV normal.   Presents today for routine f/u. Reports doing well w/ the exception of left knee arthritic pain (PCP following). Denies dyspnea w/ ADLs. No CP. BP mildly elevated. Just took AM meds prior to appt, also tributes elevated BP to knee pain. Reports usually well controlled. Compliant w/ all meds. Fully compliant w/ CPAP. C/w semaglutide. No further abdominal pain. Reports he saw GI and w/u unremarkable.    Labs (3/17): K 4.2, creatinine 1.57 Labs (5/20): LDL 118 Labs (4/21): K 5.5, creatinine 2.2 Labs (6/21): K 5, creatinine 2.19 Labs (10/21): K 5, creatinine 2.87 Labs (7/22): K 4.3, creatinine 2.62 Labs (2/23): K 5.2, creatinine 2.84 (diarrheal illness).  Labs (9/23): K 5.0, creatinine 2.98   PMH: 1. OSA: Uses CPAP.  2. Type 2 diabetes 3. Morbid obesity 4.  Hyperlipidemia 5. CKD stage 3: Sees Dr. Marval Regal.  6. Cardiomyopathy: Diagnosed in 2007. Presumed nonischemic, possibly related to long-standing HTN versus familial cardiomyopathy.  - Cardiolite in 2017: EF 25%, no ischemia.  - Echo (2017): EF 35-40%.  - Echo (11/20): EF 35-40%, mildly dilated LV, moderate LVH, moderate RV dilation with normal systolic function.  - Echo (7/21): EF 40-45%, mild LVH, RV normal.  - Echo (2/22): EF 45-50% with normal RV.  7. HTN  Social History   Socioeconomic History   Marital status: Divorced    Spouse name: Not on file   Number of children: Not on file   Years of education: Not on file   Highest education level: Not on file  Occupational History   Not on file  Tobacco Use   Smoking status: Never   Smokeless tobacco: Never  Vaping Use   Vaping Use: Never used  Substance and Sexual Activity   Alcohol use: Yes    Alcohol/week: 2.0 standard drinks of alcohol    Types: 1 Cans of beer, 1 Shots of liquor per week    Comment: socially   Drug use: Yes    Types: Marijuana    Comment: once weekly   Sexual activity: Not on file  Other Topics Concern   Not on file  Social History Narrative   Not on file   Social Determinants  of Health   Financial Resource Strain: Not on file  Food Insecurity: Not on file  Transportation Needs: Not on file  Physical Activity: Not on file  Stress: Not on file  Social Connections: Not on file  Intimate Partner Violence: Not on file   FH: Uncle with heart transplant, mother with CHF/ICD, father with CHF/ICD  ROS: All systems reviewed and negative except as per HPI.   Current Outpatient Medications  Medication Sig Dispense Refill   albuterol (PROVENTIL HFA;VENTOLIN HFA) 108 (90 BASE) MCG/ACT inhaler Inhale 2 puffs into the lungs every 4 (four) hours as needed for wheezing or shortness of breath.     amLODipine (NORVASC) 10 MG tablet Take 10 mg by mouth daily.     carvedilol (COREG) 25 MG tablet TAKE 1 AND 1/2  TABLETS(37.5 MG) BY MOUTH TWICE DAILY WITH A MEAL 270 tablet 3   cholecalciferol (VITAMIN D) 1000 UNITS tablet Take 3,000 Units by mouth every morning.      dapagliflozin propanediol (FARXIGA) 10 MG TABS tablet Take 1 tablet (10 mg total) by mouth daily before breakfast. 30 tablet 11   ezetimibe (ZETIA) 10 MG tablet Take 10 mg by mouth daily.     furosemide (LASIX) 40 MG tablet Take 1 tablet (40 mg total) by mouth as needed. 30 tablet 11   hydrALAZINE (APRESOLINE) 100 MG tablet TAKE 1 TABLET(100 MG) BY MOUTH TWICE DAILY 180 tablet 3   isosorbide mononitrate (IMDUR) 30 MG 24 hr tablet Take 30 mg by mouth in the morning and at bedtime.     losartan (COZAAR) 50 MG tablet Take 1 tablet (50 mg total) by mouth daily. 30 tablet 11   Magnesium 250 MG TABS Take 1 tablet by mouth every morning.      Semaglutide (OZEMPIC, 2 MG/DOSE, [place]) Inject 2 mg into the skin once a week.     spironolactone (ALDACTONE) 25 MG tablet TAKE 1 TABLET(25 MG) BY MOUTH DAILY 45 tablet 3   Testosterone 20.25 MG/1.25GM (1.62%) GEL Place 1 application onto the skin every morning.      No current facility-administered medications for this encounter.   BP (!) 146/44   Pulse 82   Wt (!) 152.5 kg (336 lb 3.2 oz)   SpO2 98%   BMI 46.89 kg/m  PHYSICAL EXAM: General:  Well appearing, obese. No respiratory difficulty HEENT: normal Neck: supple. no JVD. Carotids 2+ bilat; no bruits. No lymphadenopathy or thyromegaly appreciated. Cor: PMI nondisplaced. Regular rate & rhythm. No rubs, gallops or murmurs. Lungs: clear Abdomen: obese, soft, nontender, nondistended. No hepatosplenomegaly. No bruits or masses. Good bowel sounds. Extremities: no cyanosis, clubbing, rash, edema + left knee sleeve  Neuro: alert & oriented x 3, cranial nerves grossly intact. moves all 4 extremities w/o difficulty. Affect pleasant.   Assessment/Plan: 1. Cardiomyopathy/chronic systolic CHF: Uncertain etiology but suspect related to uncontrolled  hypertension vs diabetes vs a familial cardiomyopathy given extensive family history.  He had a Cardiolite in 2017 that did not show ischemia.  Echo in 2/22 with EF up to 45-50%.  Echo 7/23 EF 40-45%, RV normal. NYHA Class I-II. Euvolemic on exam  - Continue Coreg to 37.5 mg bid.   - Continue spironolactone 12.5 mg daily. Dose cut back due to elevated K and creatinine.    - Continue hydralazine 100 mg bid (cannot remember to take tid)  - Continue Imdur 60 mg daily.  - Continue losartan 50 mg daily, he was unable to tolerate Entresto due to fatigue.  Would  not increase with elevated creatinine.  - Continue dapagliflozin 10 mg daily.   - EF is out of ICD range.  - Check BMP today to ensure SCr and K stable  2. HTN: mildly elevated today in setting of knee pain, typically well controlled at home - continue GDMT per above - check BMP today  3. Type II diabetes: He is on [place].   4. Morbid obesity:  On semaglutide.       5. OSA: Reports full compliance w/ CPAP.  6. CKD: Stage 3.  Followed by Dr. Marval Regal.  - check BMP today   F/u w/ Dr. Aundra Dubin in 3-4 months    Lyda Jester, PA-C 03/03/2022

## 2022-03-03 NOTE — Patient Instructions (Signed)
It was great to see you today! No medication changes are needed at this time.   Labs today We will only contact you if something comes back abnormal or we need to make some changes. Otherwise no news is good news!   Your physician wants you to follow-up in: 3-4 months with Dr Aundra Dubin. You will receive a reminder letter in the mail two months in advance. If you don't receive a letter, please call our office to schedule the follow-up appointment.    Do the following things EVERYDAY: Weigh yourself in the morning before breakfast. Write it down and keep it in a log. Take your medicines as prescribed Eat low salt foods--Limit salt (sodium) to 2000 mg per day.  Stay as active as you can everyday Limit all fluids for the day to less than 2 liters   At the [facility], you and your health needs are our priority. As part of our continuing mission to provide you with exceptional heart care, we have created designated Provider Care Teams. These Care Teams include your primary Cardiologist (physician) and Advanced Practice Providers (APPs- Physician Assistants and Nurse Practitioners) who all work together to provide you with the care you need, when you need it.   You may see any of the following providers on your designated Care Team at your next follow up: Dr Glori Bickers Dr Loralie Champagne Dr. Roxana Hires, NP Lyda Jester, [place] [facility] [place], [place] Forestine Na, NP Audry Riles, PharmD   Please be sure to bring in all your medications bottles to every appointment.    If you have any questions or concerns before your next appointment please send [place] a message through [place] or call our office at [phone].    TO LEAVE A MESSAGE FOR THE NURSE SELECT OPTION 2, PLEASE LEAVE A MESSAGE INCLUDING: YOUR NAME DATE OF BIRTH CALL BACK NUMBER REASON FOR CALL**this is important as we prioritize the call backs  YOU WILL RECEIVE A CALL BACK  THE SAME DAY AS LONG AS YOU CALL BEFORE 4:00 PM

## 2022-03-04 ENCOUNTER — Telehealth (HOSPITAL_COMMUNITY): Payer: Self-pay | Admitting: Cardiology

## 2022-03-04 DIAGNOSIS — I5022 Chronic systolic (congestive) heart failure: Secondary | ICD-10-CM

## 2022-03-04 MED ORDER — LOSARTAN POTASSIUM 25 MG PO TABS: 25.0000 mg | ORAL_TABLET | Freq: Every day | ORAL | 6 refills | Status: DC

## 2022-03-04 MED ORDER — HYDRALAZINE HCL 100 MG PO TABS: 100.0000 mg | ORAL_TABLET | Freq: Three times a day (TID) | ORAL | 3 refills | Status: DC

## 2022-03-04 NOTE — Telephone Encounter (Signed)
Pt aware.

## 2022-03-04 NOTE — Addendum Note (Signed)
Addended by: Dreamer Carillo, Sharlot Gowda on: 03/04/2022 11:44 AM   Modules accepted: Orders

## 2022-03-04 NOTE — Telephone Encounter (Signed)
-----   Message from Consuelo Pandy, [place] sent at 03/03/2022  2:10 PM EDT ----- SCr elevated above baseline. K upper limits of normal. Reduce losartan to 25 mg daily. Needs to increase hydralazine to 100 mg tid to help keep BP under control w/ losartan decrease. Needs repeat BMP in 1 wk. Advise that he f/u w/ his nephrologist at [place].

## 2022-03-11 ENCOUNTER — Ambulatory Visit: Payer: Medicaid Other | Admitting: Physician Assistant

## 2022-03-11 ENCOUNTER — Ambulatory Visit (HOSPITAL_COMMUNITY)
Admission: RE | Admit: 2022-03-11 | Discharge: 2022-03-11 | Disposition: A | Discharge status: Home / Self Care | Payer: Medicaid Other | Source: Ambulatory Visit | Attending: Cardiology | Admitting: Cardiology

## 2022-03-11 DIAGNOSIS — I5022 Chronic systolic (congestive) heart failure: Secondary | ICD-10-CM | POA: Diagnosis present

## 2022-03-11 LAB — BASIC METABOLIC PANEL
Anion gap: 8 (ref 5–15)
BUN: 36 mg/dL — ABNORMAL HIGH (ref 6–20)
CO2: 25 mmol/L (ref 22–32)
Calcium: 9.1 mg/dL (ref 8.9–10.3)
Chloride: 103 mmol/L (ref 98–111)
Creatinine, Ser: 2.81 mg/dL — ABNORMAL HIGH (ref 0.61–1.24)
GFR, Estimated: 26 mL/min — ABNORMAL LOW (ref >60)
Glucose, Bld: 160 mg/dL — ABNORMAL HIGH (ref 70–99)
Potassium: 4.9 mmol/L (ref 3.5–5.1)
Sodium: 136 mmol/L (ref 135–145)

## 2022-04-08 ENCOUNTER — Encounter: Payer: Self-pay | Admitting: Cardiology

## 2022-04-08 ENCOUNTER — Ambulatory Visit: Payer: Medicaid Other | Attending: Cardiology | Admitting: Cardiology

## 2022-04-08 VITALS — BP 136/86 | HR 79 | Ht 71.0 in | Wt 351.4 lb

## 2022-04-08 DIAGNOSIS — I10 Essential (primary) hypertension: Secondary | ICD-10-CM | POA: Diagnosis not present

## 2022-04-08 DIAGNOSIS — Z794 Long term (current) use of insulin: Secondary | ICD-10-CM

## 2022-04-08 DIAGNOSIS — E118 Type 2 diabetes mellitus with unspecified complications: Secondary | ICD-10-CM

## 2022-04-08 DIAGNOSIS — I420 Dilated cardiomyopathy: Secondary | ICD-10-CM | POA: Diagnosis not present

## 2022-04-08 DIAGNOSIS — Z6841 Body Mass Index (BMI) 40.0 and over, adult: Secondary | ICD-10-CM

## 2022-04-08 NOTE — Patient Instructions (Signed)
Medication Instructions:  Your physician recommends that you continue on your current medications as directed. Please refer to the Current Medication list given to you today.  *If you need a refill on your cardiac medications before your next appointment, please call your pharmacy*   Lab Work: NONE If you have labs (blood work) drawn today and your tests are completely normal, you will receive your results only by: [place] (if you have MyChart) OR A paper copy in the mail If you have any lab test that is abnormal or we need to change your treatment, we will call you to review the results.   Testing/Procedures: NONE   Follow-Up: At [facility], you and your health needs are our priority.  As part of our continuing mission to provide you with exceptional heart care, we have created designated Provider Care Teams.  These Care Teams include your primary Cardiologist (physician) and Advanced Practice Providers (APPs -  Physician Assistants and Nurse Practitioners) who all work together to provide you with the care you need, when you need it.  We recommend signing up for the patient portal called "MyChart".  Sign up information is provided on this After Visit Summary.  MyChart is used to connect with patients for Virtual Visits (Telemedicine).  Patients are able to view lab/test results, encounter notes, upcoming appointments, etc.  Non-urgent messages can be sent to your provider as well.   To learn more about what you can do with MyChart, go to [email or web address].    Your next appointment:   9 month(s)  The format for your next appointment:   In Person  Provider:   Berniece Salines, DO

## 2022-04-11 NOTE — Progress Notes (Signed)
Cardiology Office Note:    Date:  04/11/2022   ID:  Dustin Mcclain, DOB Apr 05, 1966, MRN [id number]  PCP:  Glendon Axe, MD  Cardiologist:  Berniece Salines, DO  Electrophysiologist:  None   Referring MD: Glendon Axe, MD   I am doing better  History of Present Illness:    Dustin Mcclain is a 56 y.o. male with a hx of of history of hypertension, CKD stage III, hyperlipidemia, systolic heart failure, dilated cardiomyopathy EF recently improved EF to 45 to 50%.  I last saw 04/08/2021 at that time he was stable from a heart failure CV standpoint.  Since his last visit he has followed with the heart failure clinic no med changes were made that day - he got blood work.    Here for complaints today. He tells me that he waiting to here from the heart failure clinic for some paperwork that he had requested on his last visit. No chest pain or shortness of breath. He had questions about why he was seeing me along with the heart failure team and I was able to explain this to the patient and he expressed understanding.     Past Medical History:  Diagnosis Date   Allergic rhinitis    Allergic rhinitis, cause unspecified    Anemia in stage 3 chronic kidney disease ([place])    sees dr Arty Baumgartner every 3 months   Anxiety state, unspecified    Asthma    mild   Benign neoplasm of descending colon 04/15/2014   Cardiomyopathy ([place]) 07/11/2008   Qualifier: Diagnosis of  By: Annamaria Boots MD, Clinton D    CHF 07/27/2008   Qualifier: Diagnosis of  By: Annamaria Boots MD, Clinton D    CHF (congestive heart failure) ([place])    right side   Chronic systolic congestive heart failure ([place]) 10/22/2015   Depressed left ventricular systolic function 26/12/3417   Depressive disorder, not elsewhere classified    Diabetes mellitus without complication ([place])    Dilated cardiomyopathy ([place]) 07/11/2008   Qualifier: Diagnosis of  By: Annamaria Boots MD, Kasandra Knudsen   Formatting of this note might be different from the original. EF 27% on nuclear scan  showing no evidence of ischemia 5/17   DM 10/23/2009   Qualifier: Diagnosis of  By: Annamaria Boots MD, Clinton D    Essential (primary) hypertension    Essential hypertension 06/06/2008   Qualifier: Diagnosis of  By: Lockie Pares CMA, [name]     GERD (gastroesophageal reflux disease)    Insomnia 06/06/2008   Qualifier: Diagnosis of  By: Lockie Pares CMA, [name]     Insomnia, unspecified    Joint effusion 08/06/2013   Mixed hyperlipidemia 10/03/2017   Morbid obesity ([place])    Morbid obesity with BMI of 50.0-59.9, adult ([place]) 04/12/2019   Noncompliance with medication regimen 10/22/2015   Obstructive sleep apnea 06/06/2008   NPSG 08/15/16   AHI 10.8/ hr, desaturation to 83%, body weight 365 lbs- requalified for CPAP    Other primary cardiomyopathies    Primary cardiomyopathy ([place])    Right knee pain 08/06/2013   Seasonal allergic rhinitis 07/27/2008   Symptom patterns suggests primary sensitivity to spring grass pollens. Environmental precautions discussed. Potential candidate for sublingual therapy.     Shortness of breath 10/19/2015   Sleep apnea    cpap setting of 22 or 23   Tendinitis    left wrist and elbow and left knee   Unspecified disorder resulting from impaired renal function    Unspecified essential hypertension  Unspecified sleep apnea    cpap setting of 22 or 23    Past Surgical History:  Procedure Laterality Date   APPENDECTOMY     COLONOSCOPY WITH PROPOFOL N/A 04/15/2014   Procedure: COLONOSCOPY WITH PROPOFOL;  Surgeon: Irene Shipper, MD;  Location: [facility];  Service: Endoscopy;  Laterality: N/A;   PATELLAR TENDON REPAIR     left    Current Medications: Current Meds  Medication Sig   albuterol (PROVENTIL HFA;VENTOLIN HFA) 108 (90 BASE) MCG/ACT inhaler Inhale 2 puffs into the lungs every 4 (four) hours as needed for wheezing or shortness of breath.   amLODipine (NORVASC) 10 MG tablet Take 10 mg by mouth daily.   carvedilol (COREG) 25 MG tablet TAKE 1 AND 1/2 TABLETS(37.5 MG) BY MOUTH  TWICE DAILY WITH A MEAL   cholecalciferol (VITAMIN D) 1000 UNITS tablet Take 3,000 Units by mouth every morning.    dapagliflozin propanediol (FARXIGA) 10 MG TABS tablet Take 1 tablet (10 mg total) by mouth daily before breakfast.   ezetimibe (ZETIA) 10 MG tablet Take 10 mg by mouth daily.   furosemide (LASIX) 40 MG tablet Take 1 tablet (40 mg total) by mouth as needed.   hydrALAZINE (APRESOLINE) 100 MG tablet Take 1 tablet (100 mg total) by mouth 3 (three) times daily.   isosorbide mononitrate (IMDUR) 30 MG 24 hr tablet Take 30 mg by mouth in the morning and at bedtime.   losartan (COZAAR) 25 MG tablet Take 1 tablet (25 mg total) by mouth daily.   Magnesium 250 MG TABS Take 1 tablet by mouth every morning.    Potassium Chloride ER 20 MEQ TBCR Take 1 tablet by mouth 2 (two) times daily.   Semaglutide (OZEMPIC, 2 MG/DOSE, [place]) Inject 2 mg into the skin once a week.   spironolactone (ALDACTONE) 25 MG tablet TAKE 1 TABLET(25 MG) BY MOUTH DAILY   Testosterone 20.25 MG/1.25GM (1.62%) GEL Place 1 application onto the skin every morning.      Allergies:   Patient has no known allergies.   Social History   Socioeconomic History   Marital status: Divorced    Spouse name: Not on file   Number of children: Not on file   Years of education: Not on file   Highest education level: Not on file  Occupational History   Not on file  Tobacco Use   Smoking status: Never   Smokeless tobacco: Never  Vaping Use   Vaping Use: Never used  Substance and Sexual Activity   Alcohol use: Yes    Alcohol/week: 2.0 standard drinks of alcohol    Types: 1 Cans of beer, 1 Shots of liquor per week    Comment: socially   Drug use: Yes    Types: Marijuana    Comment: once weekly   Sexual activity: Not on file  Other Topics Concern   Not on file  Social History Narrative   Not on file   Social Determinants of Health   Financial Resource Strain: Not on file  Food Insecurity: Not on file  Transportation  Needs: Not on file  Physical Activity: Not on file  Stress: Not on file  Social Connections: Not on file     Family History: The patient's family history includes Diabetes in his father and mother; Heart disease in his father and mother.  ROS:   Review of Systems  Constitution: Negative for decreased appetite, fever and weight gain.  HENT: Negative for congestion, ear discharge, hoarse voice and sore  throat.   Eyes: Negative for discharge, redness, vision loss in right eye and visual halos.  Cardiovascular: Negative for chest pain, dyspnea on exertion, leg swelling, orthopnea and palpitations.  Respiratory: Negative for cough, hemoptysis, shortness of breath and snoring.   Endocrine: Negative for heat intolerance and polyphagia.  Hematologic/Lymphatic: Negative for bleeding problem. Does not bruise/bleed easily.  Skin: Negative for flushing, nail changes, rash and suspicious lesions.  Musculoskeletal: Negative for arthritis, joint pain, muscle cramps, myalgias, neck pain and stiffness.  Gastrointestinal: Negative for abdominal pain, bowel incontinence, diarrhea and excessive appetite.  Genitourinary: Negative for decreased libido, genital sores and incomplete emptying.  Neurological: Negative for brief paralysis, focal weakness, headaches and loss of balance.  Psychiatric/Behavioral: Negative for altered mental status, depression and suicidal ideas.  Allergic/Immunologic: Negative for HIV exposure and persistent infections.    EKGs/Labs/Other Studies Reviewed:    The following studies were reviewed today:   EKG:  None today   TTE 11/10/2021 IMPRESSIONS     1. Left ventricular ejection fraction, by estimation, is 40 to 45%. The  left ventricle has mildly decreased function. The left ventricle has no  regional wall motion abnormalities. Left ventricular diastolic parameters  are consistent with Grade II  diastolic dysfunction (pseudonormalization). TDS.   2. Right ventricular  systolic function is normal. The right ventricular  size is normal.   3. Left atrial size was moderately dilated.   4. The mitral valve is normal in structure. Moderate mitral valve  regurgitation. No evidence of mitral stenosis.   5. The aortic valve is normal in structure. Aortic valve regurgitation is  not visualized. No aortic stenosis is present.   6. The inferior vena cava is normal in size with greater than 50%  respiratory variability, suggesting right atrial pressure of 3 mmHg.   FINDINGS   Left Ventricle: Left ventricular ejection fraction, by estimation, is 40  to 45%. The left ventricle has mildly decreased function. The left  ventricle has no regional wall motion abnormalities. The left ventricular  internal cavity size was normal in  size. There is no left ventricular hypertrophy. Left ventricular diastolic  parameters are consistent with Grade II diastolic dysfunction  (pseudonormalization).   Right Ventricle: The right ventricular size is normal. No increase in  right ventricular wall thickness. Right ventricular systolic function is  normal.   Left Atrium: Left atrial size was moderately dilated.   Right Atrium: Right atrial size was normal in size.   Pericardium: There is no evidence of pericardial effusion.   Mitral Valve: The mitral valve is normal in structure. Moderate mitral  valve regurgitation. No evidence of mitral valve stenosis.   Tricuspid Valve: The tricuspid valve is normal in structure. Tricuspid  valve regurgitation is not demonstrated. No evidence of tricuspid  stenosis.   Aortic Valve: The aortic valve is normal in structure. Aortic valve  regurgitation is not visualized. No aortic stenosis is present. Aortic  valve mean gradient measures 4.0 mmHg. Aortic valve peak gradient measures  7.8 mmHg. Aortic valve area, by VTI  measures 1.80 cm.   Pulmonic Valve: The pulmonic valve was normal in structure. Pulmonic valve  regurgitation is not  visualized. No evidence of pulmonic stenosis.   Aorta: The aortic root is normal in size and structure.   Venous: The inferior vena cava is normal in size with greater than 50%  respiratory variability, suggesting right atrial pressure of 3 mmHg.   IAS/Shunts: No atrial level shunt detected by color flow Doppler.  Recent Labs: 11/03/2021: B Natriuretic Peptide 113.7 01/13/2022: ALT 26 03/11/2022: BUN 36; Creatinine, Ser 2.81; Potassium 4.9; Sodium 136  Recent Lipid Panel    Component Value Date/Time   CHOL 175 01/13/2022 0949   TRIG 174 (H) 01/13/2022 0949   HDL 28 (L) 01/13/2022 0949   CHOLHDL 6.3 01/13/2022 0949   VLDL 35 01/13/2022 0949   LDLCALC 112 (H) 01/13/2022 0949    Physical Exam:    VS:  BP 136/86   Pulse 79   Ht '5\' 11"'$  (1.803 m)   Wt (!) 351 lb 6.4 oz (159.4 kg)   SpO2 98%   BMI 49.01 kg/m     Wt Readings from Last 3 Encounters:  04/08/22 (!) 351 lb 6.4 oz (159.4 kg)  03/03/22 (!) 336 lb 3.2 oz (152.5 kg)  12/27/21 (!) 339 lb 9.6 oz (154 kg)     GEN: Well nourished, well developed in no acute distress HEENT: Normal NECK: No JVD; No carotid bruits LYMPHATICS: No lymphadenopathy CARDIAC: S1S2 noted,RRR, no murmurs, rubs, gallops RESPIRATORY:  Clear to auscultation without rales, wheezing or rhonchi  ABDOMEN: Soft, non-tender, non-distended, +bowel sounds, no guarding. EXTREMITIES: No edema, No cyanosis, no clubbing MUSCULOSKELETAL:  No deformity  SKIN: Warm and dry NEUROLOGIC:  Alert and oriented x 3, non-focal PSYCHIATRIC:  Normal affect, good insight  ASSESSMENT:    1. Essential hypertension   2. Dilated cardiomyopathy ([place])   3. Essential (primary) hypertension   4. Type 2 diabetes mellitus with complication, with long-term current use of insulin ([place])   5. Morbid obesity with BMI of 50.0-59.9, adult ([facility])     PLAN:     His cardiomyopathy is not thought to be ischemic.  Suspect uncontrolled hypertension versus familiar cardiomyopathy  versus diabetes.  Thankfully his EF had improved therefore there is no need to pursue ICD implantation. His recent EF was 40-45% in 11/2021.  His blood pressure acceptable in the office no changes will be made to his antihypertensive regimen.  He has been working on his weight and has lost some weight since I last saw the patient.    This is being managed by his primary care doctor.  No adjustments for antidiabetic medications were made today.   He follows with nephrology for his CKD.  The patient is in agreement with the above plan. The patient left the office in stable condition.  The patient will follow up in 12 months or sooner as needed.   Medication Adjustments/Labs and Tests Ordered: Current medicines are reviewed at length with the patient today.  Concerns regarding medicines are outlined above.  No orders of the defined types were placed in this encounter.  No orders of the defined types were placed in this encounter.   Patient Instructions  Medication Instructions:  Your physician recommends that you continue on your current medications as directed. Please refer to the Current Medication list given to you today.  *If you need a refill on your cardiac medications before your next appointment, please call your pharmacy*   Lab Work: NONE If you have labs (blood work) drawn today and your tests are completely normal, you will receive your results only by: [place] (if you have MyChart) OR A paper copy in the mail If you have any lab test that is abnormal or we need to change your treatment, we will call you to review the results.   Testing/Procedures: NONE   Follow-Up: At [facility], you and your health needs are our priority.  As part of our continuing mission to provide you with exceptional heart care, we have created designated Provider Care Teams.  These Care Teams include your primary Cardiologist (physician) and Advanced Practice Providers (APPs -   Physician Assistants and Nurse Practitioners) who all work together to provide you with the care you need, when you need it.  We recommend signing up for the patient portal called "MyChart".  Sign up information is provided on this After Visit Summary.  MyChart is used to connect with patients for Virtual Visits (Telemedicine).  Patients are able to view lab/test results, encounter notes, upcoming appointments, etc.  Non-urgent messages can be sent to your provider as well.   To learn more about what you can do with MyChart, go to [email or web address].    Your next appointment:   9 month(s)  The format for your next appointment:   In Person  Provider:   Berniece Salines, DO     Adopting a Healthy Lifestyle.  Know what a healthy weight is for you (roughly BMI <25) and aim to maintain this   Aim for 7+ servings of fruits and vegetables daily   65-80+ fluid ounces of water or unsweet tea for healthy kidneys   Limit to max 1 drink of alcohol per day; avoid smoking/tobacco   Limit animal fats in diet for cholesterol and heart health - choose grass fed whenever available   Avoid highly processed foods, and foods high in saturated/trans fats   Aim for low stress - take time to unwind and care for your mental health   Aim for 150 min of moderate intensity exercise weekly for heart health, and weights twice weekly for bone health   Aim for 7-9 hours of sleep daily   When it comes to diets, agreement about the perfect plan isnt easy to find, even among the experts. Experts at the [place] developed an idea known as the Healthy Eating Plate. Just imagine a plate divided into logical, healthy portions.   The emphasis is on diet quality:   Load up on vegetables and fruits - one-half of your plate: Aim for color and variety, and remember that potatoes dont count.   Go for whole grains - one-quarter of your plate: Whole wheat, barley, wheat berries, quinoa, oats,  brown rice, and foods made with them. If you want pasta, go with whole wheat pasta.   Protein power - one-quarter of your plate: Fish, chicken, beans, and nuts are all healthy, versatile protein sources. Limit red meat.   The diet, however, does go beyond the plate, offering a few other suggestions.   Use healthy plant oils, such as olive, canola, soy, corn, sunflower and peanut. Check the labels, and avoid partially hydrogenated oil, which have unhealthy trans fats.   If youre thirsty, drink water. Coffee and tea are good in moderation, but skip sugary drinks and limit milk and dairy products to one or two daily servings.   The type of carbohydrate in the diet is more important than the amount. Some sources of carbohydrates, such as vegetables, fruits, whole grains, and beans-are healthier than others.   Finally, stay active  Signed, Berniece Salines, DO  04/11/2022 10:00 PM    [place]

## 2022-05-05 ENCOUNTER — Other Ambulatory Visit (HOSPITAL_COMMUNITY): Payer: Self-pay | Admitting: Family Medicine

## 2022-08-03 ENCOUNTER — Other Ambulatory Visit: Payer: Self-pay | Admitting: Cardiology

## 2022-08-07 ENCOUNTER — Other Ambulatory Visit (HOSPITAL_COMMUNITY): Payer: Self-pay | Admitting: Family Medicine

## 2022-09-13 LAB — LAB REPORT - SCANNED
Creatinine, POC: 86.6 mg/dL
EGFR: 31

## 2022-09-29 ENCOUNTER — Other Ambulatory Visit (HOSPITAL_COMMUNITY): Payer: Self-pay | Admitting: Cardiology

## 2022-11-08 ENCOUNTER — Encounter: Payer: Self-pay | Admitting: Nephrology

## 2022-12-28 NOTE — Progress Notes (Unsigned)
Subjective:    Patient ID: Dustin Mcclain, male    DOB: April 02, 1966, 57 y.o.   MRN: [id number]  HPI male never smoker followed for OSA/insomnia, complicated by obesity, HBP, cardiomyopathy, renal insufficiency, DM NPSG 08/15/16   AHI 10.8/ hr, desaturation to 83%, body weight 365 lbs   .-------------------------------------------------------------------------------------   12/27/21-  57 year old male never smoker followed for OSA/insomnia, complicated by Obesity, HTN, Cardiomyopathy/CHF, renal insufficiency, DM2, GERD, CKD3, Anemia,  -albuterol hfa,  CPAP auto 5-12/Adapt    AirSense 10 AutoSet Download- compliance 93%, AHI 0.5/ hr Body weight today-339 lbs Download reviewed.  He feels he is doing well with new headgear and nasal pillows. On Ozempic now-helps with blood sugar and hopefully will help with his weight.  12/29/22-  57 year old male never smoker followed for OSA/insomnia, complicated by Obesity, HTN, Cardiomyopathy/CHF, renal insufficiency, DM2, GERD, CKD3, Anemia,  -albuterol hfa,  CPAP auto 5-12/Adapt    AirSense 10 AutoSet Download- compliance  Body weight today-    ROS-see HPI    + = positive Constitutional:   +  weight loss, night sweats, fevers, chills, fatigue, lassitude. HEENT:   No-  headaches, difficulty swallowing, tooth/dental problems, sore throat,       No-  sneezing, itching, ear ache, nasal congestion, post nasal drip,  CV:  No-   chest pain, orthopnea, PND, swelling in lower extremities, anasarca,  dizziness, palpitations Resp:+shortness of breath with exertion or at rest.              No-   productive cough,  No non-productive cough,  No- coughing up of blood.              No-   change in color of mucus.  No- wheezing.   Skin: No-   rash or lesions. GI:  No-   heartburn, indigestion, abdominal pain, nausea, vomiting,  GU: . MS:  No-   joint pain or swelling.  Neuro-     nothing unusual Psych:  No- change in mood or affect. No depression or anxiety.   No memory loss.  Objective:   Physical Exam General- Alert, Oriented, Affect-appropriate, Distress- none acute   +morbidly obese Skin- rash-none, lesions- none, excoriation- none Lymphadenopathy- none Head- atraumatic            Eyes- Gross vision intact, PERRLA, conjunctivae clear secretions            Ears- Hearing, canals normal            Nose- Clear, no- Septal dev, mucus, polyps, erosion, perforation             Throat- Mallampati III-IV , mucosa clear , drainage- none, tonsils- atrophic Neck- flexible , trachea midline, no stridor , thyroid nl, carotid no bruit Chest - symmetrical excursion , unlabored           Heart/CV- RRR , no murmur , no gallop  , no rub, nl s1 s2                           - JVD- none , edema- none, stasis changes- none, varices- none           Lung- clear to P&A, wheeze- none, cough- none , dullness-none, rub- none           Chest wall-  Abd-  Br/ Gen/ Rectal- Not done, not indicated Extrem- cyanosis- none, clubbing, none, atrophy- none, strength- nl Neuro- grossly intact to observation  Assessment &  Plan:

## 2022-12-29 ENCOUNTER — Encounter: Payer: Self-pay | Admitting: Internal Medicine

## 2022-12-29 ENCOUNTER — Ambulatory Visit (INDEPENDENT_AMBULATORY_CARE_PROVIDER_SITE_OTHER): Payer: Medicaid Other | Admitting: Internal Medicine

## 2022-12-29 VITALS — BP 128/88 | HR 86 | Ht 71.0 in | Wt 351.6 lb

## 2022-12-29 DIAGNOSIS — G4733 Obstructive sleep apnea (adult) (pediatric): Secondary | ICD-10-CM

## 2022-12-29 DIAGNOSIS — J4520 Mild intermittent asthma, uncomplicated: Secondary | ICD-10-CM | POA: Diagnosis not present

## 2022-12-29 MED ORDER — ALBUTEROL SULFATE HFA 108 (90 BASE) MCG/ACT IN AERS: 2.0000 | INHALATION_SPRAY | RESPIRATORY_TRACT | 12 refills | Status: AC | PRN

## 2022-12-29 NOTE — Patient Instructions (Signed)
We can continue CPAP auto 5-12. Glad you are doing well with it.  Refill sent for your albuterol rescue inhaler

## 2022-12-29 NOTE — Assessment & Plan Note (Signed)
Benefit from CPAP with improved sleep.  Plan- continue auto 5-12

## 2022-12-29 NOTE — Assessment & Plan Note (Signed)
Mild intermittent uncomplicated Plan- refill albuterol inhaler with medication discussion

## 2023-01-03 ENCOUNTER — Encounter: Payer: Self-pay | Admitting: Cardiology

## 2023-01-03 ENCOUNTER — Ambulatory Visit: Payer: Medicaid Other | Attending: Cardiology | Admitting: Cardiology

## 2023-01-03 ENCOUNTER — Encounter: Payer: Self-pay | Admitting: Internal Medicine

## 2023-01-03 VITALS — BP 130/85 | HR 76 | Ht 71.0 in | Wt 345.6 lb

## 2023-01-03 DIAGNOSIS — N1831 Chronic kidney disease, stage 3a: Secondary | ICD-10-CM

## 2023-01-03 DIAGNOSIS — Z6841 Body Mass Index (BMI) 40.0 and over, adult: Secondary | ICD-10-CM

## 2023-01-03 DIAGNOSIS — I10 Essential (primary) hypertension: Secondary | ICD-10-CM | POA: Diagnosis not present

## 2023-01-03 DIAGNOSIS — E118 Type 2 diabetes mellitus with unspecified complications: Secondary | ICD-10-CM

## 2023-01-03 DIAGNOSIS — Z794 Long term (current) use of insulin: Secondary | ICD-10-CM

## 2023-01-03 DIAGNOSIS — Z7984 Long term (current) use of oral hypoglycemic drugs: Secondary | ICD-10-CM

## 2023-01-03 DIAGNOSIS — I420 Dilated cardiomyopathy: Secondary | ICD-10-CM | POA: Diagnosis not present

## 2023-01-03 DIAGNOSIS — G4733 Obstructive sleep apnea (adult) (pediatric): Secondary | ICD-10-CM

## 2023-01-03 DIAGNOSIS — I5022 Chronic systolic (congestive) heart failure: Secondary | ICD-10-CM | POA: Diagnosis not present

## 2023-01-03 NOTE — Progress Notes (Addendum)
Cardiology Office Note:    Date:  01/07/2023   ID:  Dustin Mcclain, DOB 1965-11-17, MRN [id number]  PCP:  Caffie Damme, MD  Cardiologist:  Thomasene Ripple, DO  Electrophysiologist:  None   Referring MD: Caffie Damme, MD   I am doing better  History of Present Illness:    Dustin Mcclain is a 57 y.o. male with a hx of of history of hypertension, CKD stage III, hyperlipidemia, systolic heart failure, dilated cardiomyopathy EF recently improved EF to 45 to 50%.  Since his last visit he has been doing well.    Past Medical History:  Diagnosis Date   Allergic rhinitis    Allergic rhinitis, cause unspecified    Anemia in stage 3 chronic kidney disease (HCC)    sees dr Abel Presto every 3 months   Anxiety state, unspecified    Asthma    mild   Benign neoplasm of descending colon 04/15/2014   Cardiomyopathy (HCC) 07/11/2008   Qualifier: Diagnosis of  By: Maple Hudson MD, Clinton D    CHF 07/27/2008   Qualifier: Diagnosis of  By: Maple Hudson MD, Clinton D    CHF (congestive heart failure) (HCC)    right side   Chronic systolic congestive heart failure (HCC) 10/22/2015   Depressed left ventricular systolic function 04/17/2019   Depressive disorder, not elsewhere classified    Diabetes mellitus without complication (HCC)    Dilated cardiomyopathy (HCC) 07/11/2008   Qualifier: Diagnosis of  By: Maple Hudson MD, Dustin Mcclain   Formatting of this note might be different from the original. EF 27% on nuclear scan showing no evidence of ischemia 5/17   DM 10/23/2009   Qualifier: Diagnosis of  By: Maple Hudson MD, Clinton D    Essential (primary) hypertension    Essential hypertension 06/06/2008   Qualifier: Diagnosis of  By: Gilman Buttner CMA, [name]     GERD (gastroesophageal reflux disease)    Insomnia 06/06/2008   Qualifier: Diagnosis of  By: Gilman Buttner CMA, [name]     Insomnia, unspecified    Joint effusion 08/06/2013   Mixed hyperlipidemia 10/03/2017   Morbid obesity (HCC)    Morbid obesity with BMI of 50.0-59.9, adult (HCC)  04/12/2019   Noncompliance with medication regimen 10/22/2015   Obstructive sleep apnea 06/06/2008   NPSG 08/15/16   AHI 10.8/ hr, desaturation to 83%, body weight 365 lbs- requalified for CPAP    Other primary cardiomyopathies    Primary cardiomyopathy (HCC)    Right knee pain 08/06/2013   Seasonal allergic rhinitis 07/27/2008   Symptom patterns suggests primary sensitivity to spring grass pollens. Environmental precautions discussed. Potential candidate for sublingual therapy.     Shortness of breath 10/19/2015   Sleep apnea    cpap setting of 22 or 23   Tendinitis    left wrist and elbow and left knee   Unspecified disorder resulting from impaired renal function    Unspecified essential hypertension    Unspecified sleep apnea    cpap setting of 22 or 23    Past Surgical History:  Procedure Laterality Date   APPENDECTOMY     COLONOSCOPY WITH PROPOFOL N/A 04/15/2014   Procedure: COLONOSCOPY WITH PROPOFOL;  Surgeon: Hilarie Fredrickson, MD;  Location: [facility];  Service: Endoscopy;  Laterality: N/A;   PATELLAR TENDON REPAIR     left    Current Medications: Current Meds  Medication Sig   albuterol (VENTOLIN HFA) 108 (90 Base) MCG/ACT inhaler Inhale 2 puffs into the lungs every 4 (four) hours as needed  for wheezing or shortness of breath.   amLODipine (NORVASC) 10 MG tablet Take 10 mg by mouth daily.   carvedilol (COREG) 25 MG tablet TAKE 1 AND 1/2 TABLETS(37.5 MG) BY MOUTH TWICE DAILY WITH A MEAL   cholecalciferol (VITAMIN D) 1000 UNITS tablet Take 3,000 Units by mouth every morning.    dapagliflozin propanediol (FARXIGA) 10 MG TABS tablet Take 1 tablet (10 mg total) by mouth daily before breakfast.   furosemide (LASIX) 40 MG tablet Take 1 tablet (40 mg total) by mouth as needed.   hydrALAZINE (APRESOLINE) 100 MG tablet TAKE 1 TABLET(100 MG) BY MOUTH TWICE DAILY   isosorbide mononitrate (IMDUR) 30 MG 24 hr tablet Take 30 mg by mouth in the morning and at bedtime.   losartan (COZAAR) 25 MG  tablet TAKE 1 TABLET(25 MG) BY MOUTH DAILY   Magnesium 250 MG TABS Take 1 tablet by mouth every morning.    Potassium Chloride ER 20 MEQ TBCR Take 1 tablet by mouth 2 (two) times daily.   Semaglutide (OZEMPIC, 2 MG/DOSE, Steele) Inject 2 mg into the skin once a week.   spironolactone (ALDACTONE) 25 MG tablet TAKE 1 TABLET(25 MG) BY MOUTH DAILY   Testosterone 20.25 MG/1.25GM (1.62%) GEL Place 1 application onto the skin every morning.      Allergies:   Patient has no known allergies.   Social History   Socioeconomic History   Marital status: Divorced    Spouse name: Not on file   Number of children: Not on file   Years of education: Not on file   Highest education level: Not on file  Occupational History   Not on file  Tobacco Use   Smoking status: Never   Smokeless tobacco: Never  Vaping Use   Vaping status: Never Used  Substance and Sexual Activity   Alcohol use: Yes    Alcohol/week: 2.0 standard drinks of alcohol    Types: 1 Cans of beer, 1 Shots of liquor per week    Comment: socially   Drug use: Yes    Types: Marijuana    Comment: once weekly   Sexual activity: Not on file  Other Topics Concern   Not on file  Social History Narrative   Not on file   Social Determinants of Health   Financial Resource Strain: Not on file  Food Insecurity: Not on file  Transportation Needs: Not on file  Physical Activity: Not on file  Stress: Not on file  Social Connections: Not on file     Family History: The patient's family history includes Diabetes in his father and mother; Heart disease in his father and mother.  ROS:   Review of Systems  Constitution: Negative for decreased appetite, fever and weight gain.  HENT: Negative for congestion, ear discharge, hoarse voice and sore throat.   Eyes: Negative for discharge, redness, vision loss in right eye and visual halos.  Cardiovascular: Negative for chest pain, dyspnea on exertion, leg swelling, orthopnea and palpitations.   Respiratory: Negative for cough, hemoptysis, shortness of breath and snoring.   Endocrine: Negative for heat intolerance and polyphagia.  Hematologic/Lymphatic: Negative for bleeding problem. Does not bruise/bleed easily.  Skin: Negative for flushing, nail changes, rash and suspicious lesions.  Musculoskeletal: Negative for arthritis, joint pain, muscle cramps, myalgias, neck pain and stiffness.  Gastrointestinal: Negative for abdominal pain, bowel incontinence, diarrhea and excessive appetite.  Genitourinary: Negative for decreased libido, genital sores and incomplete emptying.  Neurological: Negative for brief paralysis, focal weakness, headaches and  loss of balance.  Psychiatric/Behavioral: Negative for altered mental status, depression and suicidal ideas.  Allergic/Immunologic: Negative for HIV exposure and persistent infections.    EKGs/Labs/Other Studies Reviewed:    The following studies were reviewed today:   EKG:  sinus rhythm, HR 76 bpm. Premature atrial complex  TTE 11/10/2021 IMPRESSIONS   1. Left ventricular ejection fraction, by estimation, is 40 to 45%. The  left ventricle has mildly decreased function. The left ventricle has no  regional wall motion abnormalities. Left ventricular diastolic parameters  are consistent with Grade II  diastolic dysfunction (pseudonormalization). TDS.   2. Right ventricular systolic function is normal. The right ventricular  size is normal.   3. Left atrial size was moderately dilated.   4. The mitral valve is normal in structure. Moderate mitral valve  regurgitation. No evidence of mitral stenosis.   5. The aortic valve is normal in structure. Aortic valve regurgitation is  not visualized. No aortic stenosis is present.   6. The inferior vena cava is normal in size with greater than 50%  respiratory variability, suggesting right atrial pressure of 3 mmHg.   FINDINGS   Left Ventricle: Left ventricular ejection fraction, by estimation,  is 40  to 45%. The left ventricle has mildly decreased function. The left  ventricle has no regional wall motion abnormalities. The left ventricular  internal cavity size was normal in  size. There is no left ventricular hypertrophy. Left ventricular diastolic  parameters are consistent with Grade II diastolic dysfunction  (pseudonormalization).   Right Ventricle: The right ventricular size is normal. No increase in  right ventricular wall thickness. Right ventricular systolic function is  normal.   Left Atrium: Left atrial size was moderately dilated.   Right Atrium: Right atrial size was normal in size.   Pericardium: There is no evidence of pericardial effusion.   Mitral Valve: The mitral valve is normal in structure. Moderate mitral  valve regurgitation. No evidence of mitral valve stenosis.   Tricuspid Valve: The tricuspid valve is normal in structure. Tricuspid  valve regurgitation is not demonstrated. No evidence of tricuspid  stenosis.   Aortic Valve: The aortic valve is normal in structure. Aortic valve  regurgitation is not visualized. No aortic stenosis is present. Aortic  valve mean gradient measures 4.0 mmHg. Aortic valve peak gradient measures  7.8 mmHg. Aortic valve area, by VTI  measures 1.80 cm.   Pulmonic Valve: The pulmonic valve was normal in structure. Pulmonic valve  regurgitation is not visualized. No evidence of pulmonic stenosis.   Aorta: The aortic root is normal in size and structure.   Venous: The inferior vena cava is normal in size with greater than 50%  respiratory variability, suggesting right atrial pressure of 3 mmHg.   IAS/Shunts: No atrial level shunt detected by color flow Doppler.       Recent Labs: 01/13/2022: ALT 26 03/11/2022: BUN 36; Creatinine, Ser 2.81; Potassium 4.9; Sodium 136  Recent Lipid Panel    Component Value Date/Time   CHOL 175 01/13/2022 0949   TRIG 174 (H) 01/13/2022 0949   HDL 28 (L) 01/13/2022 0949   CHOLHDL  6.3 01/13/2022 0949   VLDL 35 01/13/2022 0949   LDLCALC 112 (H) 01/13/2022 0949    Physical Exam:    VS:  BP 130/85 (BP Location: Left Arm, Patient Position: Sitting, Cuff Size: Large)   Pulse 76   Ht 5\' 11"  (1.803 m)   Wt (!) 345 lb 9.6 oz (156.8 kg)   SpO2 97%  BMI 48.20 kg/m     Wt Readings from Last 3 Encounters:  01/03/23 (!) 345 lb 9.6 oz (156.8 kg)  12/29/22 (!) 351 lb 9.6 oz (159.5 kg)  04/08/22 (!) 351 lb 6.4 oz (159.4 kg)     GEN: Well nourished, well developed in no acute distress HEENT: Normal NECK: No JVD; No carotid bruits LYMPHATICS: No lymphadenopathy CARDIAC: S1S2 noted,RRR, no murmurs, rubs, gallops RESPIRATORY:  Clear to auscultation without rales, wheezing or rhonchi  ABDOMEN: Soft, non-tender, non-distended, +bowel sounds, no guarding. EXTREMITIES: No edema, No cyanosis, no clubbing MUSCULOSKELETAL:  No deformity  SKIN: Warm and dry NEUROLOGIC:  Alert and oriented x 3, non-focal PSYCHIATRIC:  Normal affect, good insight  ASSESSMENT:    1. Chronic systolic congestive heart failure (HCC)   2. Essential hypertension   3. Dilated cardiomyopathy (HCC)   4. Essential (primary) hypertension   5. Obstructive sleep apnea syndrome   6. Type 2 diabetes mellitus with complication, with long-term current use of insulin (HCC)   7. Stage 3a chronic kidney disease (CKD) (HCC)   8. Morbid obesity with BMI of 50.0-59.9, adult (HCC)     PLAN:    Clinically Euvolemic. Continue GDMT regimen. Blood pressure is also at target.   This is being managed by his primary care doctor.  No adjustments for antidiabetic medications were made today.  He follows with nephrology for his CKD.  The patient is in agreement with the above plan. The patient left the office in stable condition.  The patient will follow up in 6 months or sooner as needed.   Medication Adjustments/Labs and Tests Ordered: Current medicines are reviewed at length with the patient today.  Concerns  regarding medicines are outlined above.  Orders Placed This Encounter  Procedures   EKG 12-Lead   No orders of the defined types were placed in this encounter.   Patient Instructions  Medication Instructions:  No changes *If you need a refill on your cardiac medications before your next appointment, please call your pharmacy*  Follow-Up: At [facility], you and your health needs are our priority.  As part of our continuing mission to provide you with exceptional heart care, we have created designated Provider Care Teams.  These Care Teams include your primary Cardiologist (physician) and Advanced Practice Providers (APPs -  Physician Assistants and Nurse Practitioners) who all work together to provide you with the care you need, when you need it.  We recommend signing up for the patient portal called "MyChart".  Sign up information is provided on this After Visit Summary.  MyChart is used to connect with patients for Virtual Visits (Telemedicine).  Patients are able to view lab/test results, encounter notes, upcoming appointments, etc.  Non-urgent messages can be sent to your provider as well.   To learn more about what you can do with MyChart, go to [email or web address].    Your next appointment:   6 month(s)  Provider:   Thomasene Ripple, DO       Adopting a Healthy Lifestyle.  Know what a healthy weight is for you (roughly BMI <25) and aim to maintain this   Aim for 7+ servings of fruits and vegetables daily   65-80+ fluid ounces of water or unsweet tea for healthy kidneys   Limit to max 1 drink of alcohol per day; avoid smoking/tobacco   Limit animal fats in diet for cholesterol and heart health - choose grass fed whenever available   Avoid highly processed foods, and foods high  in saturated/trans fats   Aim for low stress - take time to unwind and care for your mental health   Aim for 150 min of moderate intensity exercise weekly for heart health, and weights  twice weekly for bone health   Aim for 7-9 hours of sleep daily   When it comes to diets, agreement about the perfect plan isnt easy to find, even among the experts. Experts at the [facility] [employer] developed an idea known as the Healthy Eating Plate. Just imagine a plate divided into logical, healthy portions.   The emphasis is on diet quality:   Load up on vegetables and fruits - one-half of your plate: Aim for color and variety, and remember that potatoes dont count.   Go for whole grains - one-quarter of your plate: Whole wheat, barley, wheat berries, quinoa, oats, brown rice, and foods made with them. If you want pasta, go with whole wheat pasta.   Protein power - one-quarter of your plate: Fish, chicken, beans, and nuts are all healthy, versatile protein sources. Limit red meat.   The diet, however, does go beyond the plate, offering a few other suggestions.   Use healthy plant oils, such as olive, canola, soy, corn, sunflower and peanut. Check the labels, and avoid partially hydrogenated oil, which have unhealthy trans fats.   If youre thirsty, drink water. Coffee and tea are good in moderation, but skip sugary drinks and limit milk and dairy products to one or two daily servings.   The type of carbohydrate in the diet is more important than the amount. Some sources of carbohydrates, such as vegetables, fruits, whole grains, and beans-are healthier than others.   Finally, stay active  Signed, Thomasene Ripple, DO  01/07/2023 2:12 AM    [facility]

## 2023-01-03 NOTE — Patient Instructions (Signed)
Medication Instructions:  No changes *If you need a refill on your cardiac medications before your next appointment, please call your pharmacy*  Follow-Up: At [facility], you and your health needs are our priority.  As part of our continuing mission to provide you with exceptional heart care, we have created designated Provider Care Teams.  These Care Teams include your primary Cardiologist (physician) and Advanced Practice Providers (APPs -  Physician Assistants and Nurse Practitioners) who all work together to provide you with the care you need, when you need it.  We recommend signing up for the patient portal called "MyChart".  Sign up information is provided on this After Visit Summary.  MyChart is used to connect with patients for Virtual Visits (Telemedicine).  Patients are able to view lab/test results, encounter notes, upcoming appointments, etc.  Non-urgent messages can be sent to your provider as well.   To learn more about what you can do with MyChart, go to [email or web address].    Your next appointment:   6 month(s)  Provider:   Thomasene Ripple, DO

## 2023-06-09 ENCOUNTER — Other Ambulatory Visit (HOSPITAL_BASED_OUTPATIENT_CLINIC_OR_DEPARTMENT_OTHER): Payer: Self-pay | Admitting: Internal Medicine

## 2023-06-09 DIAGNOSIS — N1832 Chronic kidney disease, stage 3b: Secondary | ICD-10-CM

## 2023-06-15 ENCOUNTER — Ambulatory Visit (HOSPITAL_BASED_OUTPATIENT_CLINIC_OR_DEPARTMENT_OTHER)
Admission: RE | Admit: 2023-06-15 | Discharge: 2023-06-15 | Disposition: A | Discharge status: Home / Self Care | Payer: No Typology Code available for payment source | Source: Ambulatory Visit | Attending: Internal Medicine | Admitting: Internal Medicine

## 2023-06-15 DIAGNOSIS — N1832 Chronic kidney disease, stage 3b: Secondary | ICD-10-CM | POA: Insufficient documentation
